# Patient Record
Sex: Female | Born: 1935 | Race: White | Hispanic: No | Smoking: Never smoker
Health system: Southern US, Community
[De-identification: ages and names within clinical notes are randomized; demographics above are authoritative.]

## PROBLEM LIST (undated history)

## (undated) DIAGNOSIS — H409 Unspecified glaucoma: Secondary | ICD-10-CM

## (undated) DIAGNOSIS — E039 Hypothyroidism, unspecified: Secondary | ICD-10-CM

## (undated) DIAGNOSIS — E119 Type 2 diabetes mellitus without complications: Secondary | ICD-10-CM

## (undated) DIAGNOSIS — I10 Essential (primary) hypertension: Secondary | ICD-10-CM

## (undated) HISTORY — PX: APPENDECTOMY: SHX54

## (undated) HISTORY — PX: ABDOMINAL HYSTERECTOMY: SUR658

## (undated) HISTORY — PX: CATARACT EXTRACTION, BILATERAL: SHX1313

## (undated) HISTORY — PX: TONSILLECTOMY: SUR1361

---

## 2011-08-10 ENCOUNTER — Encounter (INDEPENDENT_AMBULATORY_CARE_PROVIDER_SITE_OTHER): Payer: Medicare Other | Admitting: Ophthalmology

## 2011-08-10 DIAGNOSIS — H3530 Unspecified macular degeneration: Secondary | ICD-10-CM

## 2011-08-10 DIAGNOSIS — H43399 Other vitreous opacities, unspecified eye: Secondary | ICD-10-CM

## 2011-08-10 DIAGNOSIS — H35039 Hypertensive retinopathy, unspecified eye: Secondary | ICD-10-CM

## 2011-08-10 DIAGNOSIS — E1165 Type 2 diabetes mellitus with hyperglycemia: Secondary | ICD-10-CM

## 2011-08-10 DIAGNOSIS — I10 Essential (primary) hypertension: Secondary | ICD-10-CM

## 2011-08-10 DIAGNOSIS — H409 Unspecified glaucoma: Secondary | ICD-10-CM

## 2011-08-10 DIAGNOSIS — H348192 Central retinal vein occlusion, unspecified eye, stable: Secondary | ICD-10-CM

## 2011-08-10 DIAGNOSIS — E11319 Type 2 diabetes mellitus with unspecified diabetic retinopathy without macular edema: Secondary | ICD-10-CM

## 2011-10-05 ENCOUNTER — Encounter (INDEPENDENT_AMBULATORY_CARE_PROVIDER_SITE_OTHER): Payer: Medicare Other | Admitting: Ophthalmology

## 2011-10-05 DIAGNOSIS — E11319 Type 2 diabetes mellitus with unspecified diabetic retinopathy without macular edema: Secondary | ICD-10-CM

## 2011-10-05 DIAGNOSIS — H35039 Hypertensive retinopathy, unspecified eye: Secondary | ICD-10-CM

## 2011-10-05 DIAGNOSIS — E1165 Type 2 diabetes mellitus with hyperglycemia: Secondary | ICD-10-CM

## 2011-10-05 DIAGNOSIS — H348192 Central retinal vein occlusion, unspecified eye, stable: Secondary | ICD-10-CM

## 2011-10-05 DIAGNOSIS — E1139 Type 2 diabetes mellitus with other diabetic ophthalmic complication: Secondary | ICD-10-CM

## 2011-10-05 DIAGNOSIS — H3530 Unspecified macular degeneration: Secondary | ICD-10-CM

## 2011-10-05 DIAGNOSIS — I10 Essential (primary) hypertension: Secondary | ICD-10-CM

## 2011-10-05 DIAGNOSIS — H43819 Vitreous degeneration, unspecified eye: Secondary | ICD-10-CM

## 2011-11-02 ENCOUNTER — Encounter (INDEPENDENT_AMBULATORY_CARE_PROVIDER_SITE_OTHER): Payer: Medicare Other | Admitting: Ophthalmology

## 2011-11-02 DIAGNOSIS — I10 Essential (primary) hypertension: Secondary | ICD-10-CM

## 2011-11-02 DIAGNOSIS — H35039 Hypertensive retinopathy, unspecified eye: Secondary | ICD-10-CM

## 2011-11-02 DIAGNOSIS — H43819 Vitreous degeneration, unspecified eye: Secondary | ICD-10-CM

## 2011-11-02 DIAGNOSIS — E1165 Type 2 diabetes mellitus with hyperglycemia: Secondary | ICD-10-CM

## 2011-11-02 DIAGNOSIS — H348192 Central retinal vein occlusion, unspecified eye, stable: Secondary | ICD-10-CM

## 2011-11-02 DIAGNOSIS — H3530 Unspecified macular degeneration: Secondary | ICD-10-CM

## 2011-11-02 DIAGNOSIS — E11319 Type 2 diabetes mellitus with unspecified diabetic retinopathy without macular edema: Secondary | ICD-10-CM

## 2011-12-12 ENCOUNTER — Encounter (INDEPENDENT_AMBULATORY_CARE_PROVIDER_SITE_OTHER): Payer: Medicare Other | Admitting: Ophthalmology

## 2011-12-20 ENCOUNTER — Encounter (INDEPENDENT_AMBULATORY_CARE_PROVIDER_SITE_OTHER): Payer: Medicare Other | Admitting: Ophthalmology

## 2011-12-20 DIAGNOSIS — H43819 Vitreous degeneration, unspecified eye: Secondary | ICD-10-CM

## 2011-12-20 DIAGNOSIS — I10 Essential (primary) hypertension: Secondary | ICD-10-CM

## 2011-12-20 DIAGNOSIS — H3530 Unspecified macular degeneration: Secondary | ICD-10-CM

## 2011-12-20 DIAGNOSIS — H348192 Central retinal vein occlusion, unspecified eye, stable: Secondary | ICD-10-CM

## 2011-12-20 DIAGNOSIS — H35039 Hypertensive retinopathy, unspecified eye: Secondary | ICD-10-CM

## 2012-01-11 ENCOUNTER — Encounter (INDEPENDENT_AMBULATORY_CARE_PROVIDER_SITE_OTHER): Payer: Medicare Other | Admitting: Ophthalmology

## 2012-01-11 DIAGNOSIS — E1139 Type 2 diabetes mellitus with other diabetic ophthalmic complication: Secondary | ICD-10-CM

## 2012-01-11 DIAGNOSIS — I10 Essential (primary) hypertension: Secondary | ICD-10-CM

## 2012-01-11 DIAGNOSIS — H3530 Unspecified macular degeneration: Secondary | ICD-10-CM

## 2012-01-11 DIAGNOSIS — E1165 Type 2 diabetes mellitus with hyperglycemia: Secondary | ICD-10-CM

## 2012-01-11 DIAGNOSIS — H35039 Hypertensive retinopathy, unspecified eye: Secondary | ICD-10-CM

## 2012-01-11 DIAGNOSIS — E11319 Type 2 diabetes mellitus with unspecified diabetic retinopathy without macular edema: Secondary | ICD-10-CM

## 2012-01-11 DIAGNOSIS — H348192 Central retinal vein occlusion, unspecified eye, stable: Secondary | ICD-10-CM

## 2012-01-11 DIAGNOSIS — H43819 Vitreous degeneration, unspecified eye: Secondary | ICD-10-CM

## 2012-02-13 ENCOUNTER — Encounter (INDEPENDENT_AMBULATORY_CARE_PROVIDER_SITE_OTHER): Payer: Medicare Other | Admitting: Ophthalmology

## 2012-02-13 DIAGNOSIS — H43819 Vitreous degeneration, unspecified eye: Secondary | ICD-10-CM

## 2012-02-13 DIAGNOSIS — E11319 Type 2 diabetes mellitus with unspecified diabetic retinopathy without macular edema: Secondary | ICD-10-CM

## 2012-02-13 DIAGNOSIS — H348192 Central retinal vein occlusion, unspecified eye, stable: Secondary | ICD-10-CM

## 2012-02-13 DIAGNOSIS — I10 Essential (primary) hypertension: Secondary | ICD-10-CM

## 2012-02-13 DIAGNOSIS — E1165 Type 2 diabetes mellitus with hyperglycemia: Secondary | ICD-10-CM

## 2012-02-13 DIAGNOSIS — H3530 Unspecified macular degeneration: Secondary | ICD-10-CM

## 2012-02-13 DIAGNOSIS — H35039 Hypertensive retinopathy, unspecified eye: Secondary | ICD-10-CM

## 2012-03-19 ENCOUNTER — Encounter (INDEPENDENT_AMBULATORY_CARE_PROVIDER_SITE_OTHER): Payer: Medicare Other | Admitting: Ophthalmology

## 2012-03-19 DIAGNOSIS — I10 Essential (primary) hypertension: Secondary | ICD-10-CM

## 2012-03-19 DIAGNOSIS — H43819 Vitreous degeneration, unspecified eye: Secondary | ICD-10-CM

## 2012-03-19 DIAGNOSIS — H348192 Central retinal vein occlusion, unspecified eye, stable: Secondary | ICD-10-CM

## 2012-03-19 DIAGNOSIS — H3530 Unspecified macular degeneration: Secondary | ICD-10-CM

## 2012-03-19 DIAGNOSIS — E1165 Type 2 diabetes mellitus with hyperglycemia: Secondary | ICD-10-CM

## 2012-03-19 DIAGNOSIS — E11319 Type 2 diabetes mellitus with unspecified diabetic retinopathy without macular edema: Secondary | ICD-10-CM

## 2012-03-19 DIAGNOSIS — H35039 Hypertensive retinopathy, unspecified eye: Secondary | ICD-10-CM

## 2012-07-17 ENCOUNTER — Ambulatory Visit (INDEPENDENT_AMBULATORY_CARE_PROVIDER_SITE_OTHER): Payer: Medicare Other | Admitting: Ophthalmology

## 2012-07-17 DIAGNOSIS — E11319 Type 2 diabetes mellitus with unspecified diabetic retinopathy without macular edema: Secondary | ICD-10-CM

## 2012-07-17 DIAGNOSIS — H43819 Vitreous degeneration, unspecified eye: Secondary | ICD-10-CM

## 2012-07-17 DIAGNOSIS — I10 Essential (primary) hypertension: Secondary | ICD-10-CM

## 2012-07-17 DIAGNOSIS — E1165 Type 2 diabetes mellitus with hyperglycemia: Secondary | ICD-10-CM

## 2012-07-17 DIAGNOSIS — H348392 Tributary (branch) retinal vein occlusion, unspecified eye, stable: Secondary | ICD-10-CM

## 2012-07-17 DIAGNOSIS — H3530 Unspecified macular degeneration: Secondary | ICD-10-CM

## 2012-07-17 DIAGNOSIS — H35039 Hypertensive retinopathy, unspecified eye: Secondary | ICD-10-CM

## 2012-08-12 ENCOUNTER — Encounter (INDEPENDENT_AMBULATORY_CARE_PROVIDER_SITE_OTHER): Payer: Medicare Other | Admitting: Ophthalmology

## 2012-08-12 DIAGNOSIS — H43819 Vitreous degeneration, unspecified eye: Secondary | ICD-10-CM

## 2012-08-12 DIAGNOSIS — I10 Essential (primary) hypertension: Secondary | ICD-10-CM

## 2012-08-12 DIAGNOSIS — E11319 Type 2 diabetes mellitus with unspecified diabetic retinopathy without macular edema: Secondary | ICD-10-CM

## 2012-08-12 DIAGNOSIS — H35039 Hypertensive retinopathy, unspecified eye: Secondary | ICD-10-CM

## 2012-08-12 DIAGNOSIS — H348392 Tributary (branch) retinal vein occlusion, unspecified eye, stable: Secondary | ICD-10-CM

## 2012-08-12 DIAGNOSIS — H3530 Unspecified macular degeneration: Secondary | ICD-10-CM

## 2012-09-23 ENCOUNTER — Encounter (INDEPENDENT_AMBULATORY_CARE_PROVIDER_SITE_OTHER): Payer: Medicare Other | Admitting: Ophthalmology

## 2012-09-23 DIAGNOSIS — I10 Essential (primary) hypertension: Secondary | ICD-10-CM

## 2012-09-23 DIAGNOSIS — E11319 Type 2 diabetes mellitus with unspecified diabetic retinopathy without macular edema: Secondary | ICD-10-CM

## 2012-09-23 DIAGNOSIS — H348392 Tributary (branch) retinal vein occlusion, unspecified eye, stable: Secondary | ICD-10-CM

## 2012-09-23 DIAGNOSIS — H35039 Hypertensive retinopathy, unspecified eye: Secondary | ICD-10-CM

## 2012-09-23 DIAGNOSIS — E1139 Type 2 diabetes mellitus with other diabetic ophthalmic complication: Secondary | ICD-10-CM

## 2012-11-19 ENCOUNTER — Encounter (INDEPENDENT_AMBULATORY_CARE_PROVIDER_SITE_OTHER): Payer: Medicare Other | Admitting: Ophthalmology

## 2012-11-19 DIAGNOSIS — H35039 Hypertensive retinopathy, unspecified eye: Secondary | ICD-10-CM

## 2012-11-19 DIAGNOSIS — I10 Essential (primary) hypertension: Secondary | ICD-10-CM

## 2012-11-19 DIAGNOSIS — H43819 Vitreous degeneration, unspecified eye: Secondary | ICD-10-CM

## 2012-11-19 DIAGNOSIS — E11319 Type 2 diabetes mellitus with unspecified diabetic retinopathy without macular edema: Secondary | ICD-10-CM

## 2012-11-19 DIAGNOSIS — H3530 Unspecified macular degeneration: Secondary | ICD-10-CM

## 2012-11-19 DIAGNOSIS — E1139 Type 2 diabetes mellitus with other diabetic ophthalmic complication: Secondary | ICD-10-CM

## 2012-11-19 DIAGNOSIS — H348392 Tributary (branch) retinal vein occlusion, unspecified eye, stable: Secondary | ICD-10-CM

## 2013-01-07 ENCOUNTER — Encounter (INDEPENDENT_AMBULATORY_CARE_PROVIDER_SITE_OTHER): Payer: Medicare Other | Admitting: Ophthalmology

## 2013-01-07 DIAGNOSIS — E11319 Type 2 diabetes mellitus with unspecified diabetic retinopathy without macular edema: Secondary | ICD-10-CM

## 2013-01-07 DIAGNOSIS — I10 Essential (primary) hypertension: Secondary | ICD-10-CM

## 2013-01-07 DIAGNOSIS — H35039 Hypertensive retinopathy, unspecified eye: Secondary | ICD-10-CM

## 2013-01-07 DIAGNOSIS — H43819 Vitreous degeneration, unspecified eye: Secondary | ICD-10-CM

## 2013-01-07 DIAGNOSIS — H348392 Tributary (branch) retinal vein occlusion, unspecified eye, stable: Secondary | ICD-10-CM

## 2013-01-07 DIAGNOSIS — E1139 Type 2 diabetes mellitus with other diabetic ophthalmic complication: Secondary | ICD-10-CM

## 2013-02-25 ENCOUNTER — Encounter (INDEPENDENT_AMBULATORY_CARE_PROVIDER_SITE_OTHER): Payer: Medicare Other | Admitting: Ophthalmology

## 2013-02-25 DIAGNOSIS — H348392 Tributary (branch) retinal vein occlusion, unspecified eye, stable: Secondary | ICD-10-CM

## 2013-02-25 DIAGNOSIS — H3530 Unspecified macular degeneration: Secondary | ICD-10-CM

## 2013-02-25 DIAGNOSIS — E1165 Type 2 diabetes mellitus with hyperglycemia: Secondary | ICD-10-CM

## 2013-02-25 DIAGNOSIS — E11319 Type 2 diabetes mellitus with unspecified diabetic retinopathy without macular edema: Secondary | ICD-10-CM

## 2013-02-25 DIAGNOSIS — I10 Essential (primary) hypertension: Secondary | ICD-10-CM

## 2013-02-25 DIAGNOSIS — H35039 Hypertensive retinopathy, unspecified eye: Secondary | ICD-10-CM

## 2013-02-25 DIAGNOSIS — E1139 Type 2 diabetes mellitus with other diabetic ophthalmic complication: Secondary | ICD-10-CM

## 2013-02-25 DIAGNOSIS — H43819 Vitreous degeneration, unspecified eye: Secondary | ICD-10-CM

## 2013-05-20 ENCOUNTER — Encounter (INDEPENDENT_AMBULATORY_CARE_PROVIDER_SITE_OTHER): Payer: Medicare Other | Admitting: Ophthalmology

## 2013-05-20 DIAGNOSIS — I10 Essential (primary) hypertension: Secondary | ICD-10-CM

## 2013-05-20 DIAGNOSIS — H3530 Unspecified macular degeneration: Secondary | ICD-10-CM

## 2013-05-20 DIAGNOSIS — H348392 Tributary (branch) retinal vein occlusion, unspecified eye, stable: Secondary | ICD-10-CM

## 2013-05-20 DIAGNOSIS — E1139 Type 2 diabetes mellitus with other diabetic ophthalmic complication: Secondary | ICD-10-CM

## 2013-05-20 DIAGNOSIS — H35039 Hypertensive retinopathy, unspecified eye: Secondary | ICD-10-CM

## 2013-05-20 DIAGNOSIS — E1165 Type 2 diabetes mellitus with hyperglycemia: Secondary | ICD-10-CM

## 2013-05-20 DIAGNOSIS — E11319 Type 2 diabetes mellitus with unspecified diabetic retinopathy without macular edema: Secondary | ICD-10-CM

## 2013-07-01 ENCOUNTER — Encounter (INDEPENDENT_AMBULATORY_CARE_PROVIDER_SITE_OTHER): Payer: Medicare Other | Admitting: Ophthalmology

## 2013-07-01 DIAGNOSIS — E11319 Type 2 diabetes mellitus with unspecified diabetic retinopathy without macular edema: Secondary | ICD-10-CM

## 2013-07-01 DIAGNOSIS — I10 Essential (primary) hypertension: Secondary | ICD-10-CM

## 2013-07-01 DIAGNOSIS — H35039 Hypertensive retinopathy, unspecified eye: Secondary | ICD-10-CM

## 2013-07-01 DIAGNOSIS — E1139 Type 2 diabetes mellitus with other diabetic ophthalmic complication: Secondary | ICD-10-CM

## 2013-07-01 DIAGNOSIS — E1165 Type 2 diabetes mellitus with hyperglycemia: Secondary | ICD-10-CM

## 2013-07-01 DIAGNOSIS — H43819 Vitreous degeneration, unspecified eye: Secondary | ICD-10-CM

## 2013-07-01 DIAGNOSIS — H3530 Unspecified macular degeneration: Secondary | ICD-10-CM

## 2013-07-01 DIAGNOSIS — H348392 Tributary (branch) retinal vein occlusion, unspecified eye, stable: Secondary | ICD-10-CM

## 2013-09-02 ENCOUNTER — Encounter (INDEPENDENT_AMBULATORY_CARE_PROVIDER_SITE_OTHER): Payer: Medicare Other | Admitting: Ophthalmology

## 2013-09-02 DIAGNOSIS — I10 Essential (primary) hypertension: Secondary | ICD-10-CM | POA: Diagnosis not present

## 2013-09-02 DIAGNOSIS — H3530 Unspecified macular degeneration: Secondary | ICD-10-CM

## 2013-09-02 DIAGNOSIS — H43819 Vitreous degeneration, unspecified eye: Secondary | ICD-10-CM

## 2013-09-02 DIAGNOSIS — H348392 Tributary (branch) retinal vein occlusion, unspecified eye, stable: Secondary | ICD-10-CM

## 2013-09-02 DIAGNOSIS — E11319 Type 2 diabetes mellitus with unspecified diabetic retinopathy without macular edema: Secondary | ICD-10-CM

## 2013-09-02 DIAGNOSIS — H35039 Hypertensive retinopathy, unspecified eye: Secondary | ICD-10-CM | POA: Diagnosis not present

## 2013-09-02 DIAGNOSIS — E1165 Type 2 diabetes mellitus with hyperglycemia: Secondary | ICD-10-CM

## 2013-09-02 DIAGNOSIS — E1139 Type 2 diabetes mellitus with other diabetic ophthalmic complication: Secondary | ICD-10-CM

## 2013-10-21 ENCOUNTER — Encounter (INDEPENDENT_AMBULATORY_CARE_PROVIDER_SITE_OTHER): Payer: Medicare Other | Admitting: Ophthalmology

## 2013-10-21 DIAGNOSIS — E1165 Type 2 diabetes mellitus with hyperglycemia: Secondary | ICD-10-CM

## 2013-10-21 DIAGNOSIS — E1139 Type 2 diabetes mellitus with other diabetic ophthalmic complication: Secondary | ICD-10-CM | POA: Diagnosis not present

## 2013-10-21 DIAGNOSIS — H35039 Hypertensive retinopathy, unspecified eye: Secondary | ICD-10-CM

## 2013-10-21 DIAGNOSIS — I10 Essential (primary) hypertension: Secondary | ICD-10-CM

## 2013-10-21 DIAGNOSIS — H43819 Vitreous degeneration, unspecified eye: Secondary | ICD-10-CM

## 2013-10-21 DIAGNOSIS — H3530 Unspecified macular degeneration: Secondary | ICD-10-CM

## 2013-10-21 DIAGNOSIS — H348392 Tributary (branch) retinal vein occlusion, unspecified eye, stable: Secondary | ICD-10-CM

## 2013-10-21 DIAGNOSIS — E11319 Type 2 diabetes mellitus with unspecified diabetic retinopathy without macular edema: Secondary | ICD-10-CM

## 2013-12-09 ENCOUNTER — Encounter (INDEPENDENT_AMBULATORY_CARE_PROVIDER_SITE_OTHER): Payer: Medicare Other | Admitting: Ophthalmology

## 2013-12-17 ENCOUNTER — Encounter (INDEPENDENT_AMBULATORY_CARE_PROVIDER_SITE_OTHER): Payer: Medicare Other | Admitting: Ophthalmology

## 2013-12-21 ENCOUNTER — Encounter (INDEPENDENT_AMBULATORY_CARE_PROVIDER_SITE_OTHER): Payer: Medicare Other | Admitting: Ophthalmology

## 2013-12-21 DIAGNOSIS — H43813 Vitreous degeneration, bilateral: Secondary | ICD-10-CM

## 2013-12-21 DIAGNOSIS — E11311 Type 2 diabetes mellitus with unspecified diabetic retinopathy with macular edema: Secondary | ICD-10-CM

## 2013-12-21 DIAGNOSIS — H35033 Hypertensive retinopathy, bilateral: Secondary | ICD-10-CM

## 2013-12-21 DIAGNOSIS — E11331 Type 2 diabetes mellitus with moderate nonproliferative diabetic retinopathy with macular edema: Secondary | ICD-10-CM

## 2013-12-21 DIAGNOSIS — I10 Essential (primary) hypertension: Secondary | ICD-10-CM

## 2013-12-21 DIAGNOSIS — H34831 Tributary (branch) retinal vein occlusion, right eye: Secondary | ICD-10-CM

## 2013-12-21 DIAGNOSIS — E11339 Type 2 diabetes mellitus with moderate nonproliferative diabetic retinopathy without macular edema: Secondary | ICD-10-CM

## 2013-12-21 DIAGNOSIS — H3531 Nonexudative age-related macular degeneration: Secondary | ICD-10-CM

## 2014-02-08 ENCOUNTER — Encounter (INDEPENDENT_AMBULATORY_CARE_PROVIDER_SITE_OTHER): Payer: Medicare Other | Admitting: Ophthalmology

## 2014-02-11 ENCOUNTER — Encounter (INDEPENDENT_AMBULATORY_CARE_PROVIDER_SITE_OTHER): Payer: Medicare Other | Admitting: Ophthalmology

## 2014-02-11 DIAGNOSIS — H43813 Vitreous degeneration, bilateral: Secondary | ICD-10-CM

## 2014-02-11 DIAGNOSIS — H34831 Tributary (branch) retinal vein occlusion, right eye: Secondary | ICD-10-CM

## 2014-02-11 DIAGNOSIS — H35033 Hypertensive retinopathy, bilateral: Secondary | ICD-10-CM

## 2014-02-11 DIAGNOSIS — E11329 Type 2 diabetes mellitus with mild nonproliferative diabetic retinopathy without macular edema: Secondary | ICD-10-CM

## 2014-02-11 DIAGNOSIS — E11319 Type 2 diabetes mellitus with unspecified diabetic retinopathy without macular edema: Secondary | ICD-10-CM

## 2014-02-11 DIAGNOSIS — H3531 Nonexudative age-related macular degeneration: Secondary | ICD-10-CM

## 2014-02-11 DIAGNOSIS — I10 Essential (primary) hypertension: Secondary | ICD-10-CM | POA: Diagnosis not present

## 2014-04-01 ENCOUNTER — Encounter (INDEPENDENT_AMBULATORY_CARE_PROVIDER_SITE_OTHER): Payer: Medicare Other | Admitting: Ophthalmology

## 2014-04-15 ENCOUNTER — Encounter (INDEPENDENT_AMBULATORY_CARE_PROVIDER_SITE_OTHER): Payer: Medicare Other | Admitting: Ophthalmology

## 2014-04-15 DIAGNOSIS — E11339 Type 2 diabetes mellitus with moderate nonproliferative diabetic retinopathy without macular edema: Secondary | ICD-10-CM | POA: Diagnosis not present

## 2014-04-15 DIAGNOSIS — H3531 Nonexudative age-related macular degeneration: Secondary | ICD-10-CM | POA: Diagnosis not present

## 2014-04-15 DIAGNOSIS — H43813 Vitreous degeneration, bilateral: Secondary | ICD-10-CM | POA: Diagnosis not present

## 2014-04-15 DIAGNOSIS — E11319 Type 2 diabetes mellitus with unspecified diabetic retinopathy without macular edema: Secondary | ICD-10-CM | POA: Diagnosis not present

## 2014-04-15 DIAGNOSIS — H34831 Tributary (branch) retinal vein occlusion, right eye: Secondary | ICD-10-CM | POA: Diagnosis not present

## 2014-04-15 DIAGNOSIS — I10 Essential (primary) hypertension: Secondary | ICD-10-CM

## 2014-04-15 DIAGNOSIS — H35033 Hypertensive retinopathy, bilateral: Secondary | ICD-10-CM | POA: Diagnosis not present

## 2014-05-27 ENCOUNTER — Encounter (INDEPENDENT_AMBULATORY_CARE_PROVIDER_SITE_OTHER): Payer: Medicare Other | Admitting: Ophthalmology

## 2014-05-27 DIAGNOSIS — H34831 Tributary (branch) retinal vein occlusion, right eye: Secondary | ICD-10-CM

## 2014-05-27 DIAGNOSIS — H35033 Hypertensive retinopathy, bilateral: Secondary | ICD-10-CM

## 2014-05-27 DIAGNOSIS — I10 Essential (primary) hypertension: Secondary | ICD-10-CM | POA: Diagnosis not present

## 2014-05-27 DIAGNOSIS — H3531 Nonexudative age-related macular degeneration: Secondary | ICD-10-CM | POA: Diagnosis not present

## 2014-05-27 DIAGNOSIS — E11319 Type 2 diabetes mellitus with unspecified diabetic retinopathy without macular edema: Secondary | ICD-10-CM

## 2014-05-27 DIAGNOSIS — E11339 Type 2 diabetes mellitus with moderate nonproliferative diabetic retinopathy without macular edema: Secondary | ICD-10-CM | POA: Diagnosis not present

## 2014-05-27 DIAGNOSIS — E11329 Type 2 diabetes mellitus with mild nonproliferative diabetic retinopathy without macular edema: Secondary | ICD-10-CM

## 2014-05-27 DIAGNOSIS — H43813 Vitreous degeneration, bilateral: Secondary | ICD-10-CM

## 2014-07-08 ENCOUNTER — Encounter (INDEPENDENT_AMBULATORY_CARE_PROVIDER_SITE_OTHER): Payer: Medicare Other | Admitting: Ophthalmology

## 2014-07-08 DIAGNOSIS — H35033 Hypertensive retinopathy, bilateral: Secondary | ICD-10-CM | POA: Diagnosis not present

## 2014-07-08 DIAGNOSIS — E11329 Type 2 diabetes mellitus with mild nonproliferative diabetic retinopathy without macular edema: Secondary | ICD-10-CM

## 2014-07-08 DIAGNOSIS — E11319 Type 2 diabetes mellitus with unspecified diabetic retinopathy without macular edema: Secondary | ICD-10-CM

## 2014-07-08 DIAGNOSIS — I10 Essential (primary) hypertension: Secondary | ICD-10-CM

## 2014-07-08 DIAGNOSIS — H43813 Vitreous degeneration, bilateral: Secondary | ICD-10-CM | POA: Diagnosis not present

## 2014-07-08 DIAGNOSIS — H34831 Tributary (branch) retinal vein occlusion, right eye: Secondary | ICD-10-CM

## 2014-08-19 ENCOUNTER — Encounter (INDEPENDENT_AMBULATORY_CARE_PROVIDER_SITE_OTHER): Payer: Medicare Other | Admitting: Ophthalmology

## 2014-08-19 DIAGNOSIS — H43813 Vitreous degeneration, bilateral: Secondary | ICD-10-CM

## 2014-08-19 DIAGNOSIS — H3531 Nonexudative age-related macular degeneration: Secondary | ICD-10-CM

## 2014-08-19 DIAGNOSIS — H34831 Tributary (branch) retinal vein occlusion, right eye: Secondary | ICD-10-CM

## 2014-08-19 DIAGNOSIS — E11339 Type 2 diabetes mellitus with moderate nonproliferative diabetic retinopathy without macular edema: Secondary | ICD-10-CM | POA: Diagnosis not present

## 2014-08-19 DIAGNOSIS — H35033 Hypertensive retinopathy, bilateral: Secondary | ICD-10-CM

## 2014-08-19 DIAGNOSIS — E11319 Type 2 diabetes mellitus with unspecified diabetic retinopathy without macular edema: Secondary | ICD-10-CM | POA: Diagnosis not present

## 2014-08-19 DIAGNOSIS — I10 Essential (primary) hypertension: Secondary | ICD-10-CM

## 2014-09-30 ENCOUNTER — Encounter (INDEPENDENT_AMBULATORY_CARE_PROVIDER_SITE_OTHER): Payer: Medicare Other | Admitting: Ophthalmology

## 2014-09-30 DIAGNOSIS — E11339 Type 2 diabetes mellitus with moderate nonproliferative diabetic retinopathy without macular edema: Secondary | ICD-10-CM | POA: Diagnosis not present

## 2014-09-30 DIAGNOSIS — H3531 Nonexudative age-related macular degeneration: Secondary | ICD-10-CM

## 2014-09-30 DIAGNOSIS — E11329 Type 2 diabetes mellitus with mild nonproliferative diabetic retinopathy without macular edema: Secondary | ICD-10-CM | POA: Diagnosis not present

## 2014-09-30 DIAGNOSIS — E11319 Type 2 diabetes mellitus with unspecified diabetic retinopathy without macular edema: Secondary | ICD-10-CM

## 2014-09-30 DIAGNOSIS — I10 Essential (primary) hypertension: Secondary | ICD-10-CM | POA: Diagnosis not present

## 2014-09-30 DIAGNOSIS — H35033 Hypertensive retinopathy, bilateral: Secondary | ICD-10-CM

## 2014-09-30 DIAGNOSIS — H34831 Tributary (branch) retinal vein occlusion, right eye: Secondary | ICD-10-CM | POA: Diagnosis not present

## 2014-09-30 DIAGNOSIS — H43813 Vitreous degeneration, bilateral: Secondary | ICD-10-CM | POA: Diagnosis not present

## 2014-11-04 ENCOUNTER — Encounter (INDEPENDENT_AMBULATORY_CARE_PROVIDER_SITE_OTHER): Payer: Medicare Other | Admitting: Ophthalmology

## 2014-11-04 DIAGNOSIS — H35033 Hypertensive retinopathy, bilateral: Secondary | ICD-10-CM

## 2014-11-04 DIAGNOSIS — H34831 Tributary (branch) retinal vein occlusion, right eye, with macular edema: Secondary | ICD-10-CM | POA: Diagnosis not present

## 2014-11-04 DIAGNOSIS — H353132 Nonexudative age-related macular degeneration, bilateral, intermediate dry stage: Secondary | ICD-10-CM | POA: Diagnosis not present

## 2014-11-04 DIAGNOSIS — I10 Essential (primary) hypertension: Secondary | ICD-10-CM

## 2014-12-16 ENCOUNTER — Encounter (INDEPENDENT_AMBULATORY_CARE_PROVIDER_SITE_OTHER): Payer: Medicare Other | Admitting: Ophthalmology

## 2014-12-16 DIAGNOSIS — H353132 Nonexudative age-related macular degeneration, bilateral, intermediate dry stage: Secondary | ICD-10-CM

## 2014-12-16 DIAGNOSIS — H43813 Vitreous degeneration, bilateral: Secondary | ICD-10-CM

## 2014-12-16 DIAGNOSIS — H35033 Hypertensive retinopathy, bilateral: Secondary | ICD-10-CM | POA: Diagnosis not present

## 2014-12-16 DIAGNOSIS — E113292 Type 2 diabetes mellitus with mild nonproliferative diabetic retinopathy without macular edema, left eye: Secondary | ICD-10-CM | POA: Diagnosis not present

## 2014-12-16 DIAGNOSIS — E113311 Type 2 diabetes mellitus with moderate nonproliferative diabetic retinopathy with macular edema, right eye: Secondary | ICD-10-CM

## 2014-12-16 DIAGNOSIS — H34831 Tributary (branch) retinal vein occlusion, right eye, with macular edema: Secondary | ICD-10-CM

## 2014-12-16 DIAGNOSIS — I10 Essential (primary) hypertension: Secondary | ICD-10-CM

## 2014-12-16 DIAGNOSIS — E11311 Type 2 diabetes mellitus with unspecified diabetic retinopathy with macular edema: Secondary | ICD-10-CM | POA: Diagnosis not present

## 2015-01-14 ENCOUNTER — Encounter (HOSPITAL_COMMUNITY): Payer: Self-pay | Admitting: Internal Medicine

## 2015-01-14 ENCOUNTER — Inpatient Hospital Stay (HOSPITAL_COMMUNITY)
Admission: AD | Admit: 2015-01-14 | Discharge: 2015-01-18 | DRG: 065 | Disposition: A | Discharge status: Rehabilitation Facility | Payer: Medicare Other | Source: Other Acute Inpatient Hospital | Attending: Internal Medicine | Admitting: Internal Medicine

## 2015-01-14 ENCOUNTER — Telehealth: Payer: Self-pay | Admitting: Internal Medicine

## 2015-01-14 DIAGNOSIS — I4892 Unspecified atrial flutter: Secondary | ICD-10-CM | POA: Diagnosis present

## 2015-01-14 DIAGNOSIS — H409 Unspecified glaucoma: Secondary | ICD-10-CM | POA: Diagnosis present

## 2015-01-14 DIAGNOSIS — N390 Urinary tract infection, site not specified: Secondary | ICD-10-CM | POA: Diagnosis present

## 2015-01-14 DIAGNOSIS — Z823 Family history of stroke: Secondary | ICD-10-CM

## 2015-01-14 DIAGNOSIS — E1169 Type 2 diabetes mellitus with other specified complication: Secondary | ICD-10-CM | POA: Diagnosis not present

## 2015-01-14 DIAGNOSIS — I6350 Cerebral infarction due to unspecified occlusion or stenosis of unspecified cerebral artery: Secondary | ICD-10-CM | POA: Diagnosis not present

## 2015-01-14 DIAGNOSIS — I69359 Hemiplegia and hemiparesis following cerebral infarction affecting unspecified side: Secondary | ICD-10-CM | POA: Insufficient documentation

## 2015-01-14 DIAGNOSIS — E119 Type 2 diabetes mellitus without complications: Secondary | ICD-10-CM

## 2015-01-14 DIAGNOSIS — E876 Hypokalemia: Secondary | ICD-10-CM | POA: Diagnosis present

## 2015-01-14 DIAGNOSIS — E785 Hyperlipidemia, unspecified: Secondary | ICD-10-CM | POA: Diagnosis present

## 2015-01-14 DIAGNOSIS — Z6834 Body mass index (BMI) 34.0-34.9, adult: Secondary | ICD-10-CM

## 2015-01-14 DIAGNOSIS — Z8249 Family history of ischemic heart disease and other diseases of the circulatory system: Secondary | ICD-10-CM

## 2015-01-14 DIAGNOSIS — E1142 Type 2 diabetes mellitus with diabetic polyneuropathy: Secondary | ICD-10-CM | POA: Diagnosis present

## 2015-01-14 DIAGNOSIS — I10 Essential (primary) hypertension: Secondary | ICD-10-CM | POA: Diagnosis not present

## 2015-01-14 DIAGNOSIS — R471 Dysarthria and anarthria: Secondary | ICD-10-CM | POA: Diagnosis present

## 2015-01-14 DIAGNOSIS — M159 Polyosteoarthritis, unspecified: Secondary | ICD-10-CM | POA: Insufficient documentation

## 2015-01-14 DIAGNOSIS — E039 Hypothyroidism, unspecified: Secondary | ICD-10-CM | POA: Diagnosis present

## 2015-01-14 DIAGNOSIS — E1151 Type 2 diabetes mellitus with diabetic peripheral angiopathy without gangrene: Secondary | ICD-10-CM | POA: Diagnosis present

## 2015-01-14 DIAGNOSIS — R29810 Facial weakness: Secondary | ICD-10-CM | POA: Diagnosis present

## 2015-01-14 DIAGNOSIS — R269 Unspecified abnormalities of gait and mobility: Secondary | ICD-10-CM | POA: Diagnosis present

## 2015-01-14 DIAGNOSIS — E669 Obesity, unspecified: Secondary | ICD-10-CM | POA: Diagnosis present

## 2015-01-14 DIAGNOSIS — G8194 Hemiplegia, unspecified affecting left nondominant side: Secondary | ICD-10-CM | POA: Diagnosis present

## 2015-01-14 DIAGNOSIS — R52 Pain, unspecified: Secondary | ICD-10-CM

## 2015-01-14 DIAGNOSIS — I639 Cerebral infarction, unspecified: Secondary | ICD-10-CM | POA: Diagnosis not present

## 2015-01-14 DIAGNOSIS — I6319 Cerebral infarction due to embolism of other precerebral artery: Secondary | ICD-10-CM | POA: Diagnosis present

## 2015-01-14 DIAGNOSIS — I6789 Other cerebrovascular disease: Secondary | ICD-10-CM | POA: Diagnosis not present

## 2015-01-14 DIAGNOSIS — I6359 Cerebral infarction due to unspecified occlusion or stenosis of other cerebral artery: Secondary | ICD-10-CM | POA: Diagnosis not present

## 2015-01-14 DIAGNOSIS — M10042 Idiopathic gout, left hand: Secondary | ICD-10-CM | POA: Diagnosis not present

## 2015-01-14 DIAGNOSIS — R4781 Slurred speech: Secondary | ICD-10-CM | POA: Diagnosis present

## 2015-01-14 HISTORY — DX: Essential (primary) hypertension: I10

## 2015-01-14 HISTORY — DX: Type 2 diabetes mellitus without complications: E11.9

## 2015-01-14 HISTORY — DX: Hypothyroidism, unspecified: E03.9

## 2015-01-14 HISTORY — DX: Unspecified glaucoma: H40.9

## 2015-01-14 MED ORDER — INSULIN ASPART 100 UNIT/ML ~~LOC~~ SOLN: 0.0000 [IU] | Freq: Every day | SUBCUTANEOUS | Status: DC

## 2015-01-14 MED ORDER — ASPIRIN 325 MG PO TABS
325.0000 mg | ORAL_TABLET | Freq: Every day | ORAL | Status: DC
  Administered 2015-01-15 – 2015-01-17 (×4): 325 mg via ORAL
  Filled 2015-01-14 (×3): qty 1

## 2015-01-14 MED ORDER — ENOXAPARIN SODIUM 40 MG/0.4ML ~~LOC~~ SOLN
40.0000 mg | SUBCUTANEOUS | Status: DC
  Administered 2015-01-15 – 2015-01-16 (×3): 40 mg via SUBCUTANEOUS
  Filled 2015-01-14 (×3): qty 0.4

## 2015-01-14 MED ORDER — STROKE: EARLY STAGES OF RECOVERY BOOK
Freq: Once | Status: AC
  Administered 2015-01-15

## 2015-01-14 MED ORDER — INSULIN ASPART 100 UNIT/ML ~~LOC~~ SOLN
0.0000 [IU] | Freq: Three times a day (TID) | SUBCUTANEOUS | Status: DC
  Administered 2015-01-15 (×2): 2 [IU] via SUBCUTANEOUS
  Administered 2015-01-15: 1 [IU] via SUBCUTANEOUS
  Administered 2015-01-16: 2 [IU] via SUBCUTANEOUS
  Administered 2015-01-16: 1 [IU] via SUBCUTANEOUS
  Administered 2015-01-16 – 2015-01-17 (×2): 2 [IU] via SUBCUTANEOUS
  Administered 2015-01-17: 5 [IU] via SUBCUTANEOUS
  Administered 2015-01-17 – 2015-01-18 (×2): 2 [IU] via SUBCUTANEOUS
  Administered 2015-01-18: 3 [IU] via SUBCUTANEOUS
  Administered 2015-01-18: 2 [IU] via SUBCUTANEOUS

## 2015-01-14 MED ORDER — SENNOSIDES-DOCUSATE SODIUM 8.6-50 MG PO TABS: 1.0000 | ORAL_TABLET | Freq: Every evening | ORAL | Status: DC | PRN

## 2015-01-14 NOTE — H&P (Addendum)
[facility] Admission History and Physical       Valerie Pham ZOX:[id number] DOB: 09-25-1927 DOA: 01/14/2015  Referring physician: EDP PCP: Valerie RichmondAVIS,JAMES W, MD  Specialists:   Chief Complaint: Slurred Speech and Left Sided Weakness  HPI: Valerie Pham is a 81 y.o. female with a history of HTN, DM2, Hypothyroid, and Galucoma who was admitted to [facility] on 12/15 for symptoms of left sided facial droop, slurred speech and left sided weakness.  An MRI of the Brain revealed a Right Pontine CVA.  In the AM today her symptoms appeared to be worsening and her doctor was concerned that she may have progression of her CVA and MRI services would not be available until Monday, so her family wanted her transferred to [facility].  Her symptoms over the day began to improve some, and she was able to lift her left arm but has 4/5 strength.  Her left leg is weaker than her left arm, and she continues to have slurring of her speech.       Review of Systems:    Constitutional: No Weight Loss, No Weight Gain, Night Sweats, Fevers, Chills, Dizziness, Light Headedness, Fatigue, or Generalized Weakness HEENT: No Headaches, Difficulty Swallowing,Tooth/Dental Problems,Sore Throat,  No Sneezing, Rhinitis, Ear Ache, Nasal Congestion, or Post Nasal Drip,  Cardio-vascular:  No Chest pain, Orthopnea, PND, Edema in Lower Extremities, Anasarca, Dizziness, Palpitations  Resp: No Dyspnea, No DOE, No Productive Cough, No Non-Productive Cough, No Hemoptysis, No Wheezing.    GI: No Heartburn, Indigestion, Abdominal Pain, Nausea, Vomiting, Diarrhea, Constipation, Hematemesis, Hematochezia, Melena, Change in Bowel Habits,  Loss of Appetite  GU: No Dysuria, No Change in Color of Urine, No Urgency or Urinary Frequency, No Flank pain.  Musculoskeletal: No Joint Pain or Swelling, No Decreased Range of Motion, No Back Pain.  Neurologic: No Syncope, No Seizures, Muscle Weakness, Paresthesia, Vision  Disturbance or Loss, No Diplopia, No Vertigo, No Difficulty Walking,  Skin: No Rash or Lesions. Psych: No Change in Mood or Affect, No Depression or Anxiety, No Memory loss, No Confusion, or Hallucinations   Past Medical History:  Essential HTN  Type II DM  Hypothyroid  Glaucoma   Past Surgical History:  Hysterectomy  Tonsillectomy  Appendectomy  Cataract Extraction (Bilateral)    Prior to Admission medications   Not on File       To Be Verified and Listed by Pharmacy   Allergies:     PCN   Social History:  has no tobacco, alcohol, and drug history on file.     Family History:    Mother - CVA, HTN  Father - CAD   Physical Exam:  GEN:  Pleasant Obese Elderly  81 y.o.  Caucasian female examined and in no acute distress; cooperative with exam Filed Vitals:   01/14/15 2012  BP: 193/89  Pulse: 76  Temp: 98.4 F (36.9 C)  TempSrc: Oral  Resp: 19  Height: 5\' 7"  (1.702 m)  Weight: 99 kg (218 lb 4.1 oz)  SpO2: 95%   Blood pressure 193/89, pulse 76, temperature 98.4 F (36.9 C), temperature source Oral, resp. rate 19, height 5\' 7"  (1.702 m), weight 99 kg (218 lb 4.1 oz), SpO2 95 %. PSYCH: She is alert and oriented x4; does not appear anxious does not appear depressed; affect is normal HEENT: Normocephalic and Atraumatic, Mucous membranes pink; PERRLA; EOM intact; Fundi:  Benign;  No scleral icterus, Nares: Patent, Oropharynx: Clear, Fair Dentition,    Neck:  FROM, No  Cervical Lymphadenopathy nor Thyromegaly or Carotid Bruit; No JVD; Breasts:: Not examined CHEST WALL: No tenderness CHEST: Normal respiration, clear to auscultation bilaterally HEART: Regular rate and rhythm; no murmurs rubs or gallops BACK: No kyphosis or scoliosis; No CVA tenderness ABDOMEN: Positive Bowel Sounds, Obese, Soft Non-Tender, No Rebound or Guarding; No Masses, No Organomegaly, No Pannus; No Intertriginous candida. Rectal Exam: Not done EXTREMITIES: No Cyanosis, Clubbing, or Edema; No  Ulcerations. Genitalia: not examined PULSES: 2+ and symmetric SKIN: Normal hydration no rash or ulceration CNS:  Alert and Oriented x 4, No Focal Deficits Mental Status:  Alert, Oriented, Thought Content Appropriate. Speech Fluent without evidence of Aphasia. Able to follow 3 step commands without difficulty.  In No obvious pain.   Cranial Nerves:  II: Discs flat bilaterally; Visual fields Intact, Pupils equal and reactive.    III,IV, VI: Extra-ocular motions intact bilaterally    V,VII: smile symmetric, facial light touch sensation normal bilaterally    VIII: hearing intact    IX,X: gag reflex present    XI: bilateral shoulder shrug    XII: midline tongue extension   Motor:  Right:  Upper extremity 5/5     Left:  Upper extremity 4/5     Right:  Lower extremity 5/5    Left:  Lower extremity 4 MINUS/5     Tone and Bulk:  normal tone throughout; no atrophy noted   Sensory:  Pinprick and light touch intact throughout, bilaterally   Deep Tendon Reflexes: 2+ and symmetric throughout   Plantars/ Babinski:  Right: normal Left:  normal    Cerebellar:  Finger to nose without difficulty.   Gait: deferred    Vascular: pulses palpable throughout    Labs on Admission:  Basic Metabolic Panel: No results for input(s): NA, K, CL, CO2, GLUCOSE, BUN, CREATININE, CALCIUM, MG, PHOS in the last 168 hours. Liver Function Tests: No results for input(s): AST, ALT, ALKPHOS, BILITOT, PROT, ALBUMIN in the last 168 hours. No results for input(s): LIPASE, AMYLASE in the last 168 hours. No results for input(s): AMMONIA in the last 168 hours. CBC: No results for input(s): WBC, NEUTROABS, HGB, HCT, MCV, PLT in the last 168 hours. Cardiac Enzymes: No results for input(s): CKTOTAL, CKMB, CKMBINDEX, TROPONINI in the last 168 hours.  BNP (last 3 results) No results for input(s): BNP in the last 8760 hours.  ProBNP (last 3 results) No results for input(s): PROBNP in the last 8760 hours.  CBG: No  results for input(s): GLUCAP in the last 168 hours.  Radiological Exams on Admission: No results found.   EKG: Independently reviewed.        Assessment/Plan:      81 y.o. female with  Principal Problem:   1.      CVA (cerebral vascular accident) [facility])   Repeat MRI of Brain tonight   Neuro Checks   Check Fasting Lipids and Hb A1C   ASA Rx   Notified  Neurology of arrival   Active Problems:   2.      Essential hypertension   PRN IV Hydralazine   Verify home Meds     3.      DM type 2 (diabetes mellitus, type 2) (HCC)   SSI coverage PRN   Check HBA1C      4.      Hypothyroidism   Continue Levothyroxine Rx   Check TSH level     5.      DVT Prophylaxis    Lovenox  Code Status:     FULL CODE       Family Communication:   Family at Bedside     Disposition Plan:    Inpatient  Observation Status        Time spent:   58 Minutes      Ron Parker [facility] Pager [phone]   If 7AM -7PM Please Contact the Day Rounding Team MD for [facility]  If 7PM-7AM, Please Contact Night-Floor Coverage  [email or web address] Password [email or web address] 01/14/2015, 10:05 PM     ADDENDUM:   Patient was seen and examined on 01/14/2015

## 2015-01-14 NOTE — Progress Notes (Signed)
From Dr. Santina Evansatherine Coe/ Dr. Marvis MoellerMiles at [facility]  This is in a 81-year-old female history of hypertension, hyperlipidemia, diabetes mellitus, hypothyroidism presented to the emergency department on 01/13/2015 with slurred speech and gait difficulty. CT of the head was negative at that time. MRI did show a right pontine stroke. Patient's infant had begun to improve however upon seeing the patient this morning 01/14/2015, patient was noted to have left-sided weakness is progressing. Currently 4-5 on the left side with some mild left-sided facial droop. Reason for transfer, is to obtain repeat MRI to evaluate for progressive CVA.  [facility] not [facility] available until Monday. Transferring physician did speak with neurology here at [facility] who said they would accept the patient in consult. Have asked for all documentation in studies to be sent with the patient. Currently vital signs are stable she's alert and oriented.    Patient accepted to neuro telemetry bed as an inpatient. Please make neurology aware patient's arrival.  Time spent: 10 minutes  Porchea Charrier D.O. [facility] Pager [phone]  If 7PM-7AM, please contact night-coverage [email or web address] Password [email or web address] 01/14/2015, 5:40 PM

## 2015-01-15 ENCOUNTER — Inpatient Hospital Stay (HOSPITAL_COMMUNITY): Payer: Medicare Other

## 2015-01-15 DIAGNOSIS — I639 Cerebral infarction, unspecified: Secondary | ICD-10-CM

## 2015-01-15 DIAGNOSIS — I6789 Other cerebrovascular disease: Secondary | ICD-10-CM

## 2015-01-15 LAB — COMPREHENSIVE METABOLIC PANEL
ALT: 12 U/L — ABNORMAL LOW (ref 14–54)
ANION GAP: 10 (ref 5–15)
AST: 13 U/L — ABNORMAL LOW (ref 15–41)
Albumin: 3.6 g/dL (ref 3.5–5.0)
Alkaline Phosphatase: 54 U/L (ref 38–126)
BUN: 20 mg/dL (ref 6–20)
CHLORIDE: 103 mmol/L (ref 101–111)
CO2: 27 mmol/L (ref 22–32)
CREATININE: 1 mg/dL (ref 0.44–1.00)
Calcium: 9.2 mg/dL (ref 8.9–10.3)
GFR, EST AFRICAN AMERICAN: 58 mL/min — AB (ref >60)
GFR, EST NON AFRICAN AMERICAN: 50 mL/min — AB (ref >60)
Glucose, Bld: 224 mg/dL — ABNORMAL HIGH (ref 65–99)
POTASSIUM: 3.2 mmol/L — AB (ref 3.5–5.1)
SODIUM: 140 mmol/L (ref 135–145)
Total Bilirubin: 0.9 mg/dL (ref 0.3–1.2)
Total Protein: 6.9 g/dL (ref 6.5–8.1)

## 2015-01-15 LAB — BASIC METABOLIC PANEL
Anion gap: 9 (ref 5–15)
BUN: 24 mg/dL — AB (ref 6–20)
CO2: 26 mmol/L (ref 22–32)
CREATININE: 0.73 mg/dL (ref 0.44–1.00)
Calcium: 9 mg/dL (ref 8.9–10.3)
Chloride: 106 mmol/L (ref 101–111)
GFR calc Af Amer: >60 mL/min (ref >60)
GLUCOSE: 159 mg/dL — AB (ref 65–99)
POTASSIUM: 3.1 mmol/L — AB (ref 3.5–5.1)
SODIUM: 141 mmol/L (ref 135–145)

## 2015-01-15 LAB — CBC
HCT: 42 % (ref 36.0–46.0)
Hemoglobin: 13.8 g/dL (ref 12.0–15.0)
MCH: 29.7 pg (ref 26.0–34.0)
MCHC: 32.9 g/dL (ref 30.0–36.0)
MCV: 90.5 fL (ref 78.0–100.0)
PLATELETS: 239 10*3/uL (ref 150–400)
RBC: 4.64 MIL/uL (ref 3.87–5.11)
RDW: 14.8 % (ref 11.5–15.5)
WBC: 12.7 10*3/uL — ABNORMAL HIGH (ref 4.0–10.5)

## 2015-01-15 LAB — URINE MICROSCOPIC-ADD ON

## 2015-01-15 LAB — GLUCOSE, CAPILLARY
GLUCOSE-CAPILLARY: 145 mg/dL — AB (ref 65–99)
GLUCOSE-CAPILLARY: 199 mg/dL — AB (ref 65–99)
Glucose-Capillary: 191 mg/dL — ABNORMAL HIGH (ref 65–99)
Glucose-Capillary: 165 mg/dL — ABNORMAL HIGH (ref 65–99)

## 2015-01-15 LAB — URINALYSIS, ROUTINE W REFLEX MICROSCOPIC
BILIRUBIN URINE: NEGATIVE
Glucose, UA: NEGATIVE mg/dL
HGB URINE DIPSTICK: NEGATIVE
KETONES UR: NEGATIVE mg/dL
Nitrite: NEGATIVE
PH: 5 (ref 5.0–8.0)
Protein, ur: NEGATIVE mg/dL
SPECIFIC GRAVITY, URINE: 1.01 (ref 1.005–1.030)

## 2015-01-15 LAB — LIPID PANEL
CHOL/HDL RATIO: 3.8 ratio
CHOLESTEROL: 134 mg/dL (ref 0–200)
HDL: 35 mg/dL — ABNORMAL LOW (ref >40)
LDL Cholesterol: 72 mg/dL (ref 0–99)
Triglycerides: 136 mg/dL (ref <150)
VLDL: 27 mg/dL (ref 0–40)

## 2015-01-15 LAB — MAGNESIUM: MAGNESIUM: 1.5 mg/dL — AB (ref 1.7–2.4)

## 2015-01-15 MED ORDER — ATORVASTATIN CALCIUM 10 MG PO TABS
20.0000 mg | ORAL_TABLET | Freq: Every day | ORAL | Status: DC
  Administered 2015-01-15 – 2015-01-18 (×4): 20 mg via ORAL
  Filled 2015-01-15 (×5): qty 2

## 2015-01-15 MED ORDER — LEVOTHYROXINE SODIUM 50 MCG PO TABS
75.0000 ug | ORAL_TABLET | Freq: Every day | ORAL | Status: DC
  Administered 2015-01-16 – 2015-01-18 (×3): 75 ug via ORAL
  Filled 2015-01-15 (×3): qty 1

## 2015-01-15 MED ORDER — POTASSIUM CHLORIDE CRYS ER 20 MEQ PO TBCR
20.0000 meq | EXTENDED_RELEASE_TABLET | Freq: Two times a day (BID) | ORAL | Status: AC
  Administered 2015-01-15 – 2015-01-16 (×4): 20 meq via ORAL
  Filled 2015-01-15 (×3): qty 1

## 2015-01-15 NOTE — Progress Notes (Signed)
STROKE TEAM PROGRESS NOTE   HISTORY Valerie Pham is a 81 y.o. female who was last seen well 12/15 and developed slurred speech and difficult walking. She presented to [facility] in [place] where an MRI was performed which showed a right pontine infarct. She had some mild worsening overnight and therefore was transferred for neurological consultation to [facility]. Her son states that since this morning, she appears to have made improvements. She states that she feels this way as well.  LKW: 12/15 tpa given?: no, outside of window    SUBJECTIVE (INTERVAL HISTORY) The patient's son is present. The patient feels her speech is improving. She reports some "bladder problems" but denies dysuria. She continues to have mild left upper extremity weakness and a mild left facial droop.   OBJECTIVE Temp:  [98.1 F (36.7 C)-98.6 F (37 C)] 98.1 F (36.7 C) (12/17 1408) Pulse Rate:  [72-98] 88 (12/17 1408) Cardiac Rhythm:  [-] Normal sinus rhythm;Heart block (12/17 0700) Resp:  [18-21] 20 (12/17 1408) BP: (155-193)/(63-93) 155/63 mmHg (12/17 1408) SpO2:  [95 %-98 %] 98 % (12/17 1408) Weight:  [99 kg (218 lb 4.1 oz)] 99 kg (218 lb 4.1 oz) (12/16 2012)  CBC: No results for input(s): WBC, NEUTROABS, HGB, HCT, MCV, PLT in the last 168 hours.  Basic Metabolic Panel:  Recent Labs Lab 01/15/15 0139  NA 141  K 3.1*  CL 106  CO2 26  GLUCOSE 159*  BUN 24*  CREATININE 0.73  CALCIUM 9.0    Lipid Panel:    Component Value Date/Time   CHOL 134 01/15/2015 0139   TRIG 136 01/15/2015 0139   HDL 35* 01/15/2015 0139   CHOLHDL 3.8 01/15/2015 0139   VLDL 27 01/15/2015 0139   LDLCALC 72 01/15/2015 0139   HgbA1c: No results found for: HGBA1C Urine Drug Screen: No results found for: LABOPIA, COCAINSCRNUR, LABBENZ, AMPHETMU, THCU, LABBARB    IMAGING   [facility] Studies:  MRI HEAD:  Acute 4 x 14 mm RIGHT pontine ischemia.  Involutional changes. Moderate chronic small  vessel ischemicdisease.    MRA HEAD:  Generalized decreased signal to noise ratio limitsevaluation of vertebral artery's without acute large vessel  occlusion or definite high-grade stenosis. If clinically indicated,repeat exam could be performed versus CTA head.    MRA NECK: Moderate bilateral V1 stenosis versus artifact. Noconvincing evidence of high-grade stenosis with aforementioned limitations.          PHYSICAL EXAM Mental Status: pleasant 81 year old female alert and sociable. Patient is awake, alert, oriented to person, place, month, year, and situation. No signs of aphasia or neglect Cranial Nerves: II: Visual Fields are full. Pupils are equal, round, and reactive to light.  III,IV, VI: EOMI without ptosis or diploplia.  V: Facial sensation is symmetric to temperature VII: mild left lower facial droop VIII: hearing is intact to voice X: Uvula elevates symmetrically XI: Shoulder shrug is symmetric. XII: tongue is midline without atrophy or fasciculations.  Motor: Tone is normal. Bulk is normal. Strength - 5/5 right upper and lower extremities. 4/5 left upper and lower extremities. Sensory: Sensation is symmetric to light touch and temperature in the arms and legs. Cerebellar: Finger to nose performed with some difficulty on the left.    ASSESSMENT/PLAN Valerie Pham is a 81 y.o. female with history of Hypertension, hyperlipidemia, atrial flutter,diabetes, and hypothyroidism presenting in transfer from [facility] in [place] with a pontine infarct. She did not receive IV t-PA due to late  presentation.  Stroke:  Non-dominant infarct secondary to small vessel disease.  Resultant left hemiparesis  MRI  See above  MRA  See above  Carotid Doppler - refer to MRA of the neck  2D Echo - EF 65-70%. No cardiac source of emboli identified.  LDL - 72  HgbA1c pending  VTE prophylaxis - Lovenox  Diet heart healthy/carb modified Room  service appropriate?: Yes; Fluid consistency:: Thin  No antithrombotic prior to admission, now on aspirin 325 mg daily  Patient counseled to be compliant with her antithrombotic medications  Ongoing aggressive stroke risk factor management  Therapy recommendations: pending  Disposition: pending  Hypertension  Blood pressure is mildly high at times Permissive hypertension (OK if < 220/120) but gradually normalize in 5-7 days  Hyperlipidemia  Home meds:  Lipitor 20 mg daily resumed in hospital - will order  LDL 72, goal < 70  Continue statin at discharge  Diabetes  HgbA1c Pending, goal < 7.0  Unc / Controlled - await hemoglobin A1c results  Other Stroke Risk Factors  Advanced age  Former smoker  Obesity, Body mass index is 34.18 kg/(m^2).   Family hx stroke (mother)  History of atrial flutter - not sure patient would be a candidate for anticoagulation. Will order telemetry.   Other Active Problems  Hypokalemia - supplement  Mildly elevated BUN - encourage fluids  Check a urinalysis - patient reports "bladder problems" . Strong smelling urine.  Hospital day # 1  Delton See PA-C [facility] Pager [phone] 01/15/2015, 3:01 PM  I have personally examined this patient, reviewed notes, independently viewed imaging studies, participated in medical decision making and plan of care. I have made any additions or clarifications directly to the above note. Agree with note above.   Pt/ot Case management/social work as likely needs assistance. Lives with husband.   Valerie Pham    To contact Stroke Continuity provider, please refer to [email or web address]. After hours, contact General Neurology

## 2015-01-15 NOTE — Progress Notes (Signed)
  Echocardiogram 2D Echocardiogram has been performed.  Valerie Pham, Valerie Pham M 01/15/2015, 9:28 AM

## 2015-01-15 NOTE — Consult Note (Signed)
Neurology Consultation Reason for Consult: stroke Referring Physician: Lovell Sheehanjenkins, H    CC: Stroke  History is obtained from: Patient, son  HPI: Valerie Pham is a 81 y.o. female who was last seen well 12/15 and developed slurred speech and difficult walking. She presented to [facility] [place] where an MRI was performed which showed a pontine infarct. She had some mild worsening overnight and therefore was transferred for neurological consultation to [facility].  Her son states that since this morning, she appears to have made improvements. She states that she feels this way as well.   LKW: 12/15 tpa given?: no, outside of window    ROS: A 14 point ROS was performed and is negative except as noted in the HPI.   Past Medical History  Diagnosis Date  . Benign essential HTN   . DM2 (diabetes mellitus, type 2) (HCC)   . Hypothyroid   . Glaucoma      Family History  Problem Relation Age of Onset  . Stroke Mother   . Hypertension Mother   . CAD Father      Social History:  has no tobacco, alcohol, and drug history on file.   Exam: Current vital signs: BP 183/72 mmHg  Pulse 72  Temp(Src) 98.6 F (37 C) (Oral)  Resp 18  Ht 5\' 7"  (1.702 m)  Wt 99 kg (218 lb 4.1 oz)  BMI 34.18 kg/m2  SpO2 97% Vital signs in last 24 hours: Temp:  [98.4 F (36.9 C)-98.6 F (37 C)] 98.6 F (37 C) (12/16 2200) Pulse Rate:  [72-78] 72 (12/16 2200) Resp:  [18-19] 18 (12/16 2200) BP: (180-193)/(72-91) 183/72 mmHg (12/16 2200) SpO2:  [95 %-98 %] 97 % (12/16 2200) Weight:  [99 kg (218 lb 4.1 oz)] 99 kg (218 lb 4.1 oz) (12/16 2012)   Physical Exam  Constitutional: Appears well-developed and well-nourished.  Psych: Affect appropriate to situation Eyes: No scleral injection HENT: No OP obstrucion Head: Normocephalic.  Cardiovascular: Normal rate and regular rhythm.  Respiratory: Effort normal and breath sounds normal to anterior ascultation GI: Soft.  No  distension. There is no tenderness.  Skin: WDI  Neuro: Mental Status: Patient is awake, alert, oriented to person, place, month, year, and situation. Patient is able to give a clear and coherent history. No signs of aphasia or neglect Cranial Nerves: II: Visual Fields are full. Pupils are equal, round, and reactive to light.   III,IV, VI: EOMI without ptosis or diploplia.  V: Facial sensation is symmetric to temperature VII: Facial movement is decreased on the left lower face very mildly VIII: hearing is intact to voice X: Uvula elevates symmetrically XI: Shoulder shrug is symmetric. XII: tongue is midline without atrophy or fasciculations.  Motor: Tone is normal. Bulk is normal. 5/5 strength was present on the right side, on the left she has 4/5 strength in the arm and leg, arm affected slightly more than leg Sensory: Sensation is symmetric to light touch and temperature in the arms and legs. Cerebellar: She appears to have some discoordination of both her left arm and leg slightly out of proportion to weakness.      I have reviewed the images obtained: MRI of the brain, MRA of the head and neck- pontine infarct. I have a question of some stenosis in the right vertebral, but I suspect that this is artifactual.  Impression: 81 year old female with pontine infarct. She will need to be evaluated for secondary stroke prevention and work with physical  therapy and occupational therapy.  Recommendations: 1. HgbA1c, fasting lipid panel 2. Frequent neuro checks 3. Echocardiogram 4. Prophylactic therapy-Antiplatelet med: Aspirin - dose  PO or  PR 5. Risk factor modification 6. Telemetry monitoring 7. PT consult, OT consult, Speech consult 8. Obtain outside imaging reports   Ritta Slot, MD [facility] [phone]  If 7pm- 7am, please page neurology on call as listed in AMION.

## 2015-01-15 NOTE — Progress Notes (Signed)
Incontinent x 4 large amounts.  PVR x 2 with bladder scan = 196 & 200.  Patient stated she was having periods of urgency & frequency at home.  Urine odorous, sightlyl cloudy when used bedpan x 1. Pt OOB x 2 to attempt voiding with max 2 assist, utilizing gait belt & stedy.  Will continue toileting schedule.

## 2015-01-15 NOTE — Progress Notes (Addendum)
PROGRESS NOTE    Valerie Pham:[id number] DOB: 1927/11/11 DOA: 01/14/2015 PCP: Carmin Richmond, MD  HPI/Brief narrative  81 year old female with history of HTN, DM 2, hypothyroid, glaucoma was admitted to [facility] on 01/13/15 for symptoms of left sided facial droop, slurred speech and left-sided weakness. MRI of brain revealed right pontine CVA. The next day, her symptoms appear to worsen and her physicians were concerned that she may have progression of her CVA and MRI services were not available until Monday and hence on family's request, patient was transferred to [facility]. Her symptoms progressively improved. Neurology consulted.   Assessment/Plan:  Stroke: Non-dominant infarct secondary to small vessel disease. Resultant left hemiparesis and dysarthria MRI  Acute 4 x 14 right pontine ischemia MRA head  No acute large vessel occlusion or definite high-grade stenosis MRA neck  Moderate bilateral V1 stenosis versus artifact. No convincing evidence of high-grade stenosis Carotid Doppler - refer to MRA of the neck 2D Echo - EF 65-70%. No cardiac source of emboli identified. LDL - 72 HgbA1c pending No antithrombotic prior to admission, now on aspirin 325 mg daily Patient counseled to be compliant with her antithrombotic medications Ongoing aggressive stroke risk factor management Therapy recommendations: pending Disposition: pending  Essential hypertension -  Uncontrolled. Allow for permissive hypertension. Currently holding metoprolol, amlodipine and Cozaar.  Hyperlipidemia -  LDL 72, goal <70 -  Patient on Lipitor 20 MG daily at home, continue discharge  Type II DM -  Follow A1c. Continue SSI. Reasonable inpatient control. Holding oral hypoglycemics.   Hypokalemia -  Replace and follow labs in a.m.  ? H/o Atrial Flutter - per Neurology notes - no details available. SR on monitor.   Hypothyroid -  Continue Synthroid.   DVT prophylaxis: Lovenox Code Status:   Full Family Communication:  None at bedside Disposition Plan:  To be determined   Consultants:  Neurology  Procedures:   2-D echo 01/15/15: Study Conclusions  - Left ventricle: The cavity size was normal. Wall thickness was normal. Systolic function was vigorous. The estimated ejection fraction was in the range of 65% to 70%. Wall motion was normal; there were no regional wall motion abnormalities. Doppler parameters are consistent with abnormal left ventricular relaxation (grade 1 diastolic dysfunction). Doppler parameters are consistent with high ventricular filling pressure. - Aortic valve: Valve mobility was mildly restricted. There was very mild stenosis. Peak velocity (S): 219 cm/s. Mean gradient (S): 11 mm Hg. Valve area (VTI): 1.58 cm^2. - Mitral valve: Moderately calcified annulus. - Left atrium: The atrium was mildly dilated.  Impressions:  - No cardiac source of emboli was indentified.  Antibiotics:  None   Subjective:  slurred speech has not significantly improved. Left-sided weakness has improved. Left leg is weaker than arm.  Objective: Filed Vitals:   01/15/15 0600 01/15/15 1007 01/15/15 1358 01/15/15 1408  BP: 187/78  157/93 155/63  Pulse: 78 89 98 88  Temp: 98.6 F (37 C) 98.2 F (36.8 C) 98.4 F (36.9 C) 98.1 F (36.7 C)  TempSrc: Oral Oral Oral Oral  Resp: Height:      Weight:      SpO2: 98%  95% 98%   No intake or output data in the 24 hours ending 01/15/15 1537 Filed Weights   01/14/15 2012  Weight: 99 kg (218 lb 4.1 oz)     Exam:  General exam:  Pleasant elderly female lying comfortably in bed Respiratory system: Clear. No increased work of breathing.  Cardiovascular system: S1 & S2 heard, RRR. No JVD, murmurs, gallops, clicks or pedal edema. Telemetry: sinus rhythm with BBB morphology. Gastrointestinal system: Abdomen is nondistended, soft and nontender. Normal bowel sounds heard. Central nervous  system: Alert and oriented.  Dysarthria +, facial asymmetry +. Extremities:  5 x 5 power in right limbs. Grade 4 x 5 power in left upper extremity and 3 x 5 power in the left lower extremity.   Data Reviewed: Basic Metabolic Panel:  Recent Labs Lab 01/15/15 0139  NA 141  K 3.1*  CL 106  CO2 26  GLUCOSE 159*  BUN 24*  CREATININE 0.73  CALCIUM 9.0   Liver Function Tests: No results for input(s): AST, ALT, ALKPHOS, BILITOT, PROT, ALBUMIN in the last 168 hours. No results for input(s): LIPASE, AMYLASE in the last 168 hours. No results for input(s): AMMONIA in the last 168 hours. CBC: No results for input(s): WBC, NEUTROABS, HGB, HCT, MCV, PLT in the last 168 hours. Cardiac Enzymes: No results for input(s): CKTOTAL, CKMB, CKMBINDEX, TROPONINI in the last 168 hours. BNP (last 3 results) No results for input(s): PROBNP in the last 8760 hours. CBG:  Recent Labs Lab 01/15/15 0642 01/15/15 1150  GLUCAP 145* 191*    No results found for this or any previous visit (from the past 240 hour(s)).         Studies: No results found.      Scheduled Meds: . aspirin  325 mg Oral Daily  . atorvastatin  20 mg Oral q1800  . enoxaparin (LOVENOX) injection  40 mg Subcutaneous Q24H  . insulin aspart  0-5 Units Subcutaneous QHS  . insulin aspart  0-9 Units Subcutaneous TID WC  . potassium chloride  20 mEq Oral BID   Continuous Infusions:   Principal Problem:   CVA (cerebral vascular accident) (HCC) Active Problems:   Essential hypertension   DM type 2 (diabetes mellitus, type 2) (HCC)   Hypothyroidism   CVA (cerebral infarction)    Time spent: 30 minutes.    Marcellus ScottHONGALGI,Dasani Crear, MD, FACP, FHM. [facility] Pager [phone]  If 7PM-7AM, please contact night-coverage [email or web address] Password [email or web address] 01/15/2015, 3:37 PM    LOS: 1 day

## 2015-01-15 NOTE — Evaluation (Signed)
Physical Therapy Evaluation Patient Details Name: Valerie MangoSara H Moeller MRN: [id number] DOB: 24-Feb-1927 Today's Date: 01/15/2015   History of Present Illness  81 yo  female from [place] was admitted and has R pontine CVA with L side weakness, slurred speech, difficulty with focus on PT instructions  Clinical Impression  Pt was seen for assessment of bedside sitting and then to chair.  Follows instructions slowly and seems to need extra processing time.  Her plan is to get her evaluated for inpt rehab and then hopefully can get back home alone.    Follow Up Recommendations CIR    Equipment Recommendations  Rolling walker with 5" wheels    Recommendations for Other Services Rehab consult     Precautions / Restrictions Precautions Precautions: Fall (telemetry) Precaution Comments: pulses are high, 101 at start to 135 at end of transition to chair Restrictions Weight Bearing Restrictions: No      Mobility  Bed Mobility Overal bed mobility: Needs Assistance Bed Mobility: Supine to Sit     Supine to sit: Min assist;Mod assist     General bed mobility comments: min assist to get legs over and cue sequence, mod to lift under trunk  Transfers Overall transfer level: Needs assistance Equipment used: 1 person hand held assist Transfers: Sit to/from [employer];Stand Pivot Transfers Sit to Stand: Mod assist;From elevated surface (to power up from each extremity) Stand pivot transfers: Mod assist;Min assist;From elevated surface (with gait belt and PT)          Ambulation/Gait Ambulation/Gait assistance: Mod assist;Min assist Ambulation Distance (Feet): 3 Feet Assistive device: 1 person hand held assist Gait Pattern/deviations: Step-to pattern;Leaning posteriorly;Ataxic;Shuffle;Wide base of support Gait velocity: reduced, halting Gait velocity interpretation: Below normal speed for age/gender    Stairs            Wheelchair Mobility    Modified Rankin (Stroke Patients  Only) Modified Rankin (Stroke Patients Only) Pre-Morbid Rankin Score: No significant disability Modified Rankin: Moderately severe disability     Balance Overall balance assessment: Needs assistance Sitting-balance support: Feet supported Sitting balance-Leahy Scale: Fair   Postural control: Posterior lean Standing balance support: Single extremity supported Standing balance-Leahy Scale: Poor                               Pertinent Vitals/Pain Pain Assessment: No/denies pain    Home Living Family/patient expects to be discharged to:: Inpatient rehab Living Arrangements: Alone                    Prior Function Level of Independence: Independent (had caregivers for 5 hours a day for help of activities )               Hand Dominance        Extremity/Trunk Assessment   Upper Extremity Assessment: Generalized weakness           Lower Extremity Assessment: Generalized weakness      Cervical / Trunk Assessment: Normal  Communication   Communication: Expressive difficulties;Other (comment) (slurred speech)  Cognition Arousal/Alertness: Lethargic Behavior During Therapy: Flat affect Overall Cognitive Status: Impaired/Different from baseline Area of Impairment: Attention;Memory;Following commands;Safety/judgement;Awareness;Problem solving;Orientation Orientation Level: Situation;Time Current Attention Level: Selective Memory: Decreased short-term memory;Decreased recall of precautions Following Commands: Follows one step commands inconsistently Safety/Judgement: Decreased awareness of safety;Decreased awareness of deficits Awareness: Intellectual Problem Solving: Slow processing;Decreased initiation;Difficulty sequencing;Requires verbal cues;Requires tactile cues      General Comments General  comments (skin integrity, edema, etc.): Pt is with her son and his family who are informative and supportive.    Exercises         Assessment/Plan    PT Assessment Patient needs continued PT services  PT Diagnosis Generalized weakness;Hemiplegia non-dominant side   PT Problem List Decreased strength;Decreased range of motion;Decreased activity tolerance;Decreased balance;Decreased mobility;Decreased coordination;Decreased cognition;Decreased safety awareness;Decreased knowledge of use of DME;Cardiopulmonary status limiting activity;Obesity  PT Treatment Interventions DME instruction;Gait training;Functional mobility training;Therapeutic activities;Therapeutic exercise;Balance training;Neuromuscular re-education;Patient/family education   PT Goals (Current goals can be found in the Care Plan section) Acute Rehab PT Goals Patient Stated Goal: to get up to walk again PT Goal Formulation: With patient/family Time For Goal Achievement: 01/29/15 Potential to Achieve Goals: Good    Frequency Min 4X/week   Barriers to discharge Inaccessible home environment;Decreased caregiver support home alone    Co-evaluation               End of Session Equipment Utilized During Treatment: Gait belt Activity Tolerance: Patient tolerated treatment well;Patient limited by fatigue Patient left: in chair;with call bell/phone within reach;with family/visitor present Nurse Communication: Mobility status         Time: 1610-9604 PT Time Calculation (min) (ACUTE ONLY): 24 min   Charges:   PT Evaluation $Initial PT Evaluation Tier I: 1 Procedure PT Treatments $Therapeutic Activity: 8-22 mins   PT G CodesIvar Drape 02-05-15, 4:07 PM   Samul Dada, PT MS Acute Rehab Dept. Number: [facility] [id number] and [facility] [phone]

## 2015-01-15 NOTE — Plan of Care (Signed)
Problem: Acute Rehab PT Goals(only PT should resolve) Goal: Pt Will Transfer Bed To Chair/Chair To Bed With [facility]     

## 2015-01-16 ENCOUNTER — Encounter (HOSPITAL_COMMUNITY): Payer: Self-pay | Admitting: *Deleted

## 2015-01-16 DIAGNOSIS — E876 Hypokalemia: Secondary | ICD-10-CM

## 2015-01-16 LAB — GLUCOSE, CAPILLARY
GLUCOSE-CAPILLARY: 147 mg/dL — AB (ref 65–99)
GLUCOSE-CAPILLARY: 173 mg/dL — AB (ref 65–99)
Glucose-Capillary: 190 mg/dL — ABNORMAL HIGH (ref 65–99)
Glucose-Capillary: 195 mg/dL — ABNORMAL HIGH (ref 65–99)

## 2015-01-16 MED ORDER — DORZOLAMIDE HCL-TIMOLOL MAL 2-0.5 % OP SOLN
1.0000 [drp] | Freq: Two times a day (BID) | OPHTHALMIC | Status: DC
Start: 2015-01-16 — End: 2015-01-18
  Administered 2015-01-16 – 2015-01-18 (×4): 1 [drp] via OPHTHALMIC
  Filled 2015-01-16: qty 10

## 2015-01-16 MED ORDER — METOPROLOL TARTRATE 50 MG PO TABS
50.0000 mg | ORAL_TABLET | Freq: Two times a day (BID) | ORAL | Status: DC
  Administered 2015-01-16 – 2015-01-18 (×5): 50 mg via ORAL
  Filled 2015-01-16 (×5): qty 1

## 2015-01-16 MED ORDER — CIPROFLOXACIN HCL 250 MG PO TABS
250.0000 mg | ORAL_TABLET | Freq: Two times a day (BID) | ORAL | Status: DC
  Administered 2015-01-16 – 2015-01-18 (×4): 250 mg via ORAL
  Filled 2015-01-16 (×4): qty 1

## 2015-01-16 NOTE — Progress Notes (Addendum)
PROGRESS NOTE    Valerie Pham ZOX:[id number] DOB: 1927/04/26 DOA: 01/14/2015 PCP: Carmin RichmondAVIS,JAMES W, MD  HPI/Brief narrative  81 year old female with history of HTN, DM 2, hypothyroid, glaucoma was [facility] on 01/13/15 for symptoms of left sided facial droop, slurred speech and left-sided weakness. MRI of brain revealed right pontine CVA. The next day, her symptoms appear to worsen and her physicians were concerned that she may have progression of her CVA and MRI services were not available until Monday and hence on family's request, patient was transferred [facility]. Her symptoms progressively improved. Neurology consulted.   Assessment/Plan:  Stroke: Non-dominant infarct secondary to small vessel disease. Resultant left hemiparesis and dysarthria MRI  Acute 4 x 14 right pontine ischemia MRA head  No acute large vessel occlusion or definite high-grade stenosis MRA neck  Moderate bilateral V1 stenosis versus artifact. No convincing evidence of high-grade stenosis Carotid Doppler - refer to MRA of the neck 2D Echo - EF 65-70%. No cardiac source of emboli identified. LDL - 72 HgbA1c pending No antithrombotic prior to admission, now on aspirin 325 mg daily Patient counseled to be compliant with her antithrombotic medications Ongoing aggressive stroke risk factor management Therapy recommendations: CIR - consulted. Disposition: pending  Essential hypertension -  Uncontrolled. Allow for permissive hypertension. Currently holding metoprolol, amlodipine and Cozaar. Will start Metoprolol  Hyperlipidemia -  LDL 72, goal <70 -  Patient on Lipitor 20 MG daily at home, continue discharge  Type II DM -  Follow A1c. Continue SSI. Reasonable inpatient control. Holding oral hypoglycemics.   Hypokalemia/Hypomagnesemia -  follow labs & replace as needed.  ? H/o Atrial Flutter - per Neurology notes - no details available. SR on monitor.   Hypothyroid -  Continue  Synthroid.   Presumed UTI - Neurology has started 3 days of Cipro. .   DVT prophylaxis: Lovenox Code Status:  Full Family Communication:  None at bedside Disposition Plan:  To be determined   Consultants:  Neurology  Procedures:   2-D echo 01/15/15: Study Conclusions  - Left ventricle: The cavity size was normal. Wall thickness was normal. Systolic function was vigorous. The estimated ejection fraction was in the range of 65% to 70%. Wall motion was normal; there were no regional wall motion abnormalities. Doppler parameters are consistent with abnormal left ventricular relaxation (grade 1 diastolic dysfunction). Doppler parameters are consistent with high ventricular filling pressure. - Aortic valve: Valve mobility was mildly restricted. There was very mild stenosis. Peak velocity (S): 219 cm/s. Mean gradient (S): 11 mm Hg. Valve area (VTI): 1.58 cm^2. - Mitral valve: Moderately calcified annulus. - Left atrium: The atrium was mildly dilated.  Impressions:  - No cardiac source of emboli was indentified.  Antibiotics:  None   Subjective:  slurred speech has not significantly improved. Left-sided weakness has improved. Left leg is weaker than arm.  Objective: Filed Vitals:   01/15/15 1408 01/15/15 2118 01/16/15 0132 01/16/15 0551  BP: 155/63 151/95 189/83 193/88  Pulse: 88 101 92 90  Temp: 98.1 F (36.7 C) 98.1 F (36.7 C) 98.5 F (36.9 C) 97.9 F (36.6 C)  TempSrc: Oral Oral Oral Oral  Resp: 20 18 18 18   Height:      Weight:      SpO2: 98% 96% 97% 97%    Intake/Output Summary (Last 24 hours) at 01/16/15 0749 Last data filed at 01/15/15 1715  Gross per 24 hour  Intake      0 ml  Output  800 ml  Net   -800 ml   Filed Weights   01/14/15 2012  Weight: 99 kg (218 lb 4.1 oz)     Exam:  General exam:  Pleasant elderly female lying comfortably in bed Respiratory system: Clear. No increased work of breathing. Cardiovascular  system: S1 & S2 heard, RRR. No JVD, murmurs, gallops, clicks or pedal edema. Telemetry: sinus rhythm with BBB morphology. Gastrointestinal system: Abdomen is nondistended, soft and nontender. Normal bowel sounds heard. Central nervous system: Alert and oriented.  Dysarthria +, facial asymmetry +. Extremities:  5 x 5 power in right limbs. Grade 4 x 5 power in left upper extremity and 3 x 5 power in the left lower extremity.   Data Reviewed: Basic Metabolic Panel:  Recent Labs Lab 01/15/15 0139 01/15/15 1635  NA 141 140  K 3.1* 3.2*  CL 106 103  CO2 26 27  GLUCOSE 159* 224*  BUN 24* 20  CREATININE 0.73 1.00  CALCIUM 9.0 9.2  MG  --  1.5*   Liver Function Tests:  Recent Labs Lab 01/15/15 1635  AST 13*  ALT 12*  ALKPHOS 54  BILITOT 0.9  PROT 6.9  ALBUMIN 3.6   No results for input(s): LIPASE, AMYLASE in the last 168 hours. No results for input(s): AMMONIA in the last 168 hours. CBC:  Recent Labs Lab 01/15/15 1635  WBC 12.7*  HGB 13.8  HCT 42.0  MCV 90.5  PLT 239   Cardiac Enzymes: No results for input(s): CKTOTAL, CKMB, CKMBINDEX, TROPONINI in the last 168 hours. BNP (last 3 results) No results for input(s): PROBNP in the last 8760 hours. CBG:  Recent Labs Lab 01/15/15 0642 01/15/15 1150 01/15/15 1636 01/15/15 2133 01/16/15 0631  GLUCAP 145* 191* 199* 165* 147*    No results found for this or any previous visit (from the past 240 hour(s)).         Studies: No results found.      Scheduled Meds: . aspirin  325 mg Oral Daily  . atorvastatin  20 mg Oral q1800  . enoxaparin (LOVENOX) injection  40 mg Subcutaneous Q24H  . insulin aspart  0-5 Units Subcutaneous QHS  . insulin aspart  0-9 Units Subcutaneous TID WC  . levothyroxine  75 mcg Oral QAC breakfast  . potassium chloride  20 mEq Oral BID   Continuous Infusions:   Principal Problem:   CVA (cerebral vascular accident) (HCC) Active Problems:   Essential hypertension   DM type 2  (diabetes mellitus, type 2) (HCC)   Hypothyroidism   CVA (cerebral infarction)    Time spent: 30 minutes.    Marcellus Scott, MD, FACP, FHM. [facility] Pager [phone]  If 7PM-7AM, please contact night-coverage [email or web address] Password [email or web address] 01/16/2015, 7:49 AM    LOS: 2 days

## 2015-01-16 NOTE — Progress Notes (Signed)
STROKE TEAM PROGRESS NOTE   HISTORY Valerie Pham is a 81 y.o. female who was last seen well 12/15 and developed slurred speech and difficult walking. She presented to [facility] in [place] where an MRI was performed which showed a right pontine infarct. She had some mild worsening overnight and therefore was transferred for neurological consultation to [facility]. Her son states that since this morning, she appears to have made improvements. She states that she feels this way as well.  LKW: 12/15 tpa given?: no, outside of window    SUBJECTIVE (INTERVAL HISTORY) Multiple family members present. The patient states he feels fine. She believes she is improving.    OBJECTIVE Temp:  [97.9 F (36.6 C)-98.7 F (37.1 C)] 98.7 F (37.1 C) (12/18 0933) Pulse Rate:  [88-101] 90 (12/18 0933) Cardiac Rhythm:  [-] Heart block (12/17 1900) Resp:  [18-21] 20 (12/18 0933) BP: (141-193)/(63-95) 141/82 mmHg (12/18 0933) SpO2:  [95 %-98 %] 97 % (12/18 0933)  CBC:   Recent Labs Lab 01/15/15 1635  WBC 12.7*  HGB 13.8  HCT 42.0  MCV 90.5  PLT 239    Basic Metabolic Panel:   Recent Labs Lab 01/15/15 0139 01/15/15 1635  NA 141 140  K 3.1* 3.2*  CL 106 103  CO2 26 27  GLUCOSE 159* 224*  BUN 24* 20  CREATININE 0.73 1.00  CALCIUM 9.0 9.2  MG  --  1.5*    Lipid Panel:     Component Value Date/Time   CHOL 134 01/15/2015 0139   TRIG 136 01/15/2015 0139   HDL 35* 01/15/2015 0139   CHOLHDL 3.8 01/15/2015 0139   VLDL 27 01/15/2015 0139   LDLCALC 72 01/15/2015 0139   HgbA1c: No results found for: HGBA1C Urine Drug Screen: No results found for: LABOPIA, COCAINSCRNUR, LABBENZ, AMPHETMU, THCU, LABBARB    IMAGING   [facility] Studies:  MRI HEAD:  Acute 4 x 14 mm RIGHT pontine ischemia.  Involutional changes. Moderate chronic small vessel ischemicdisease.    MRA HEAD:  Generalized decreased signal to noise ratio limitsevaluation of vertebral  artery's without acute large vessel  occlusion or definite high-grade stenosis. If clinically indicated,repeat exam could be performed versus CTA head.    MRA NECK: Moderate bilateral V1 stenosis versus artifact. Noconvincing evidence of high-grade stenosis with aforementioned limitations.          PHYSICAL EXAM Mental Status: pleasant 81 year old female alert and sociable. Patient is awake, alert, oriented to person, place, month, year, and situation. No signs of aphasia or neglect Cranial Nerves: II: Visual Fields are full. Pupils are equal, round, and reactive to light.  III,IV, VI: EOMI without ptosis or diploplia.  V: Facial sensation is symmetric to temperature VII: mild left lower facial droop VIII: hearing is intact to voice X: Uvula elevates symmetrically XI: Shoulder shrug is symmetric. XII: tongue is midline without atrophy or fasciculations.  Motor: Tone is normal. Bulk is normal. Strength - 5/5 right upper and lower extremities. 4/5 left upper and lower extremities. Sensory: Sensation is symmetric to light touch and temperature in the arms and legs. Cerebellar: Finger to nose performed with some difficulty on the left.    ASSESSMENT/PLAN Ms. Valerie Pham is a 81 y.o. female with history of Hypertension, hyperlipidemia, atrial flutter,diabetes, and hypothyroidism presenting in transfer from [facility] in [place] with a pontine infarct. She did not receive IV t-PA due to late presentation.  Stroke:  Non-dominant infarct secondary to small vessel  disease.  Resultant left hemiparesis  MRI  See above  MRA  See above  Carotid Doppler - refer to MRA of the neck  2D Echo - EF 65-70%. No cardiac source of emboli identified.  LDL - 72  HgbA1c pending  VTE prophylaxis - Lovenox Diet heart healthy/carb modified Room service appropriate?: Yes; Fluid consistency:: Thin  No antithrombotic prior to admission, now on aspirin 325 mg  daily  Patient counseled to be compliant with her antithrombotic medications  Ongoing aggressive stroke risk factor management  Therapy recommendations: CIR recommended  Disposition: pending  Hypertension  Blood pressure is mildly high at times  Permissive hypertension (OK if < 220/120) but gradually normalize in 5-7 days  Hyperlipidemia  Home meds:  Lipitor 20 mg daily resumed in hospital - will order  LDL 72, goal < 70  Continue statin at discharge  Diabetes  HgbA1c Pending, goal < 7.0  Unc / Controlled - await hemoglobin A1c results  Other Stroke Risk Factors  Advanced age  Former smoker  Obesity, Body mass index is 34.18 kg/(m^2).   Family hx stroke (mother)  History of atrial flutter - not sure patient would be a candidate for anticoagulation. Will order telemetry. HR 140's last night. Restart Lopressor at 50 mg Bid (was 100 mg Bid at home)   Other Active Problems  Hypokalemia - supplement - recheck in a.m.  Mildly elevated BUN - encourage fluids  Check a urinalysis - patient reports "bladder problems" . Strong smelling urine. 3 day course of Cipro  Hospital day # 2  Delton See PA-C [facility] Pager [phone] 01/16/2015, 9:41 AM  I have personally examined this patient, reviewed notes, independently viewed imaging studies, participated in medical decision making and plan of care. I have made any additions or clarifications directly to the above note. Agree with note above.   Will sign off call with questions     To contact Stroke Continuity provider, please refer to [email or web address]. After hours, contact General Neurology

## 2015-01-16 NOTE — Progress Notes (Signed)
Utilization Review Completed.Alcus Bradly T12/18/2016  

## 2015-01-16 NOTE — Progress Notes (Signed)
Occupational Therapy Evaluation Patient Details Name: Valerie Pham H Tholl MRN: [id number] DOB: 04/07/1927 Today's Date: 01/16/2015    History of Present Illness 81 yo  female from [place] was admitted and has R pontine CVA with L side weakness, slurred speech, difficulty with focus on PT instructions   Clinical Impression   PTA, pt lived alone and was independent with ADL and mobility. Pt presents with significant deficits as listed below and will benefit from rehab at CIR. Pt currently requires mod A with mobility and ADL. Poor insight/awareness of deficits. Supportive family who are interested in CIR, but also want to know if there is a CIR in hospital in [place]. Will follow acutely to address established goals.    Follow Up Recommendations  CIR;Supervision/Assistance - 24 hour    Equipment Recommendations  3 in 1 bedside comode;Tub/shower bench    Recommendations for Other Services Rehab consult     Precautions / Restrictions Precautions Precautions: Fall (telemetry) Precaution Comments:   Restrictions Weight Bearing Restrictions: No      Mobility Bed Mobility Overal bed mobility: Needs Assistance Bed Mobility: Supine to Sit     Supine to sit: Mod assist        Transfers Overall transfer level: Needs assistance Equipment used: 1 person hand held assist   Sit to Stand: Mod assist Stand pivot transfers: Mod assist;         Balance     Sitting balance-Leahy Scale: Fair       Standing balance-Leahy Scale: Poor                              ADL Overall ADL's : Needs assistance/impaired Eating/Feeding: Minimal assistance   Grooming: Moderate assistance   Upper Body Bathing: Moderate assistance   Lower Body Bathing: Moderate assistance   Upper Body Dressing : Moderate assistance   Lower Body Dressing: Moderate assistance Lower Body Dressing Details (indicate cue type and reason): able to lean over to pull up socks BLE Toilet  Transfer: Moderate assistance;Stand-pivot   Toileting- Clothing Manipulation and Hygiene: Maximal assistance Toileting - Clothing Manipulation Details (indicate cue type and reason): incontinent urine     Functional mobility during ADLs: Moderate assistance (+2 for ambulation) General ADL Comments: Poor insight/awareness into deficits. Daughter asking many quesitons regarding rehab process and if her mother will be able to return to PLOF. Educaated daughter on rehab options.     Vision Vision Assessment?: Yes Eye Alignment: Within Functional Limits Ocular Range of Motion: Within Functional Limits Alignment/Gaze Preference: Within Defined Limits Tracking/Visual Pursuits: Decreased smoothness of horizontal tracking Saccades: Additional head turns occurred during testing Convergence: Within functional limits Visual Fields: No apparent deficits Additional Comments: ? decreased L visual attention. Prefers R    Development worker, international aiderception Perception Perception Tested?: No   Praxis Praxis Praxis tested?: Within functional limits    Pertinent Vitals/Pain Pain Assessment: No/denies pain     Hand Dominance Right   Extremity/Trunk Assessment Upper Extremity Assessment Upper Extremity Assessment: LUE deficits/detail LUE Deficits / Details: isolated movement. generalized weakness. Able to complete @ 60 shoulder FF. gross grasp and release. minimal isolated finger movement. unable to complete hand to mouth with L hand. difficulty maintaingin grasp on object . Attempting to use during functional transfers. Poor positional awareness LUE at times.Daughter reposts LUE movement has improved since yesterday. LUE Sensation: decreased light touch LUE Coordination: decreased fine motor;decreased gross motor   Lower Extremity Assessment Lower Extremity Assessment: LLE  deficits/detail LLE Deficits / Details: weakness. isolated movemetn   Cervical / Trunk Assessment Cervical / Trunk Assessment: Normal    Communication Communication Communication: Expressive difficulties;Other (comment) (slurred speech)   Cognition Arousal/Alertness: Awake/alert Behavior During Therapy: Flat affect Overall Cognitive Status: Impaired/Different from baseline Area of Impairment: Attention;Memory;Following commands;Safety/judgement;Awareness;Problem solving;Orientation Orientation Level: Situation;Time;Place (not aware she had a CVA) Current Attention Level: Selective Memory: Decreased short-term memory;Decreased recall of precautions Following Commands: Follows one step commands consistently Safety/Judgement: Decreased awareness of safety;Decreased awareness of deficits Awareness: Emergent Problem Solving: Slow processing;Difficulty sequencing;Requires verbal cues;Requires tactile cues General Comments: mod A with transfer and pt thinks she can walk to bathroom alone   General Comments       Exercises  Other Exercises Other Exercises: encourage pt to use LUE   Shoulder Instructions      Home Living Family/patient expects to be discharged to:: Inpatient rehab Living Arrangements: Alone                                      Prior Functioning/Environment Level of Independence: Independent (had caregivers for 5 hours a day for help of activities )        Comments: has caregiver 2 days/week to assist with IADL tasks. 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    OT Diagnosis: Generalized weakness;Cognitive deficits   OT Problem List: Decreased strength;Decreased range of motion;Decreased activity tolerance;Impaired balance (sitting and/or standing);Decreased cognition;Decreased coordination;Decreased safety awareness;Decreased knowledge of use of DME or AE;Impaired sensation;Obesity;Impaired UE functional use   OT Treatment/Interventions: Self-care/ADL training;Therapeutic exercise;Neuromuscular education;DME and/or AE instruction;Therapeutic activities;Cognitive  remediation/compensation;Patient/family education;Balance training    OT Goals(Current goals can be found in the care plan section) Acute Rehab OT Goals Patient Stated Goal: to return home OT Goal Formulation: With patient Time For Goal Achievement: 01/30/15 Potential to Achieve Goals: Good ADL Goals Pt Will Perform Eating: with set-up;sitting Pt Will Perform Grooming: with set-up;sitting Pt Will Perform Upper Body Bathing: with set-up;with supervision;sitting Pt Will Perform Lower Body Bathing: with min guard assist;sit to/from stand Pt Will Transfer to Toilet: with min guard assist;bedside commode Pt Will Perform Toileting - Clothing Manipulation and hygiene: with supervision;sitting/lateral leans  OT Frequency: Min 2X/week   Barriers to D/C:            Co-evaluation              End of Session Equipment Utilized During Treatment: Gait belt Nurse Communication: Mobility status  Activity Tolerance: Patient tolerated treatment well Patient left: in chair;with call bell/phone within reach;with chair alarm set;with family/visitor present   Time: 1625-1650 OT Time Calculation (min): 25 min Charges:  OT General Charges $OT Visit: 1 Procedure OT Evaluation $Initial OT Evaluation Tier I: 1 Procedure OT Treatments $Self Care/Home Management : 8-22 mins G-Codes:    Jermani Pund,HILLARY Jan 30, 2015, 5:28 PM   [facility], OTR/L  [phone] 2015-01-30

## 2015-01-17 DIAGNOSIS — E039 Hypothyroidism, unspecified: Secondary | ICD-10-CM

## 2015-01-17 DIAGNOSIS — N390 Urinary tract infection, site not specified: Secondary | ICD-10-CM

## 2015-01-17 DIAGNOSIS — I10 Essential (primary) hypertension: Secondary | ICD-10-CM

## 2015-01-17 DIAGNOSIS — M159 Polyosteoarthritis, unspecified: Secondary | ICD-10-CM | POA: Insufficient documentation

## 2015-01-17 DIAGNOSIS — I69359 Hemiplegia and hemiparesis following cerebral infarction affecting unspecified side: Secondary | ICD-10-CM | POA: Insufficient documentation

## 2015-01-17 DIAGNOSIS — I6359 Cerebral infarction due to unspecified occlusion or stenosis of other cerebral artery: Secondary | ICD-10-CM

## 2015-01-17 DIAGNOSIS — E1142 Type 2 diabetes mellitus with diabetic polyneuropathy: Secondary | ICD-10-CM

## 2015-01-17 LAB — MAGNESIUM: MAGNESIUM: 1.6 mg/dL — AB (ref 1.7–2.4)

## 2015-01-17 LAB — GLUCOSE, CAPILLARY
GLUCOSE-CAPILLARY: 289 mg/dL — AB (ref 65–99)
GLUCOSE-CAPILLARY: 166 mg/dL — AB (ref 65–99)
GLUCOSE-CAPILLARY: 159 mg/dL — AB (ref 65–99)
Glucose-Capillary: 154 mg/dL — ABNORMAL HIGH (ref 65–99)

## 2015-01-17 LAB — HEMOGLOBIN A1C
HEMOGLOBIN A1C: 7.6 % — AB (ref 4.8–5.6)
MEAN PLASMA GLUCOSE: 171 mg/dL

## 2015-01-17 LAB — BASIC METABOLIC PANEL
Anion gap: 12 (ref 5–15)
BUN: 29 mg/dL — AB (ref 6–20)
CHLORIDE: 103 mmol/L (ref 101–111)
CO2: 24 mmol/L (ref 22–32)
Calcium: 8.5 mg/dL — ABNORMAL LOW (ref 8.9–10.3)
Creatinine, Ser: 0.84 mg/dL (ref 0.44–1.00)
GFR calc Af Amer: >60 mL/min (ref >60)
GFR calc non Af Amer: >60 mL/min (ref >60)
GLUCOSE: 172 mg/dL — AB (ref 65–99)
POTASSIUM: 3.5 mmol/L (ref 3.5–5.1)
SODIUM: 139 mmol/L (ref 135–145)

## 2015-01-17 LAB — URINE CULTURE

## 2015-01-17 MED ORDER — POTASSIUM CHLORIDE CRYS ER 20 MEQ PO TBCR
40.0000 meq | EXTENDED_RELEASE_TABLET | Freq: Once | ORAL | Status: AC
  Administered 2015-01-17: 40 meq via ORAL
  Filled 2015-01-17: qty 2

## 2015-01-17 MED ORDER — MAGNESIUM SULFATE 2 GM/50ML IV SOLN
2.0000 g | Freq: Once | INTRAVENOUS | Status: AC
  Administered 2015-01-17: 2 g via INTRAVENOUS
  Filled 2015-01-17: qty 50

## 2015-01-17 MED ORDER — APIXABAN 5 MG PO TABS
5.0000 mg | ORAL_TABLET | Freq: Two times a day (BID) | ORAL | Status: DC
  Administered 2015-01-17 – 2015-01-18 (×3): 5 mg via ORAL
  Filled 2015-01-17 (×3): qty 1

## 2015-01-17 NOTE — H&P (Signed)
Physical Medicine and Rehabilitation Admission H&P    Chief complaint: Left-sided weakness   HPI: Valerie Pham is a 81 y.o. right handed female with history of hypertension, atrial flutter maintained on aspirin, hypothyroidism, diabetes mellitus and peripheral neuropathy. By report patient uses a cane and lives alone in [place] had caregivers 5 hours a day for help of activities and ADLs. Presented 01/13/2015 from [facility] with left-sided weakness and slurred speech. Patient did not receive TPA. MRI at outside hospital revealed right pontine CVA. Echocardiogram showed ejection fraction of 62% grade 1 diastolic dysfunction. No cardiac source of emboli. MRA of head and neck without large vessel occlusion or stenosis. Neurology follow-up and placed on Eliquis for CVA prophylaxis . Tolerating a regular consistency diet. Urine study with negative nitrite cultures multiple species initially on Cipro and discontinued. Patient with unwitnessed fall on 01/18/2015 and found on floor with complaints of left index finger pain and no other trauma sustained. X-rays of left hand showed soft tissue swelling about the index finger without fracture. Physical and occupational therapy evaluations completed 01/15/2015 with recommendations of physical medicine rehabilitation consult. Patient was admitted for comprehensive rehabilitation program  ROS Constitutional: Negative for fever and chills.  HENT: Negative for hearing loss.  Eyes: Negative for blurred vision and double vision.  Respiratory: Negative for cough and shortness of breath.  Cardiovascular: Negative for chest pain, palpitations and leg swelling.  Gastrointestinal: Positive for constipation. Negative for nausea and vomiting.  Genitourinary: Positive for urgency. Negative for dysuria and hematuria.  Musculoskeletal: Positive for myalgias, joint pain and falls.  Neurological: Positive for focal weakness. Negative for dizziness, seizures,  loss of consciousness and headaches.  All other systems reviewed and are negative   Past Medical History  Diagnosis Date  . Benign essential HTN   . DM2 (diabetes mellitus, type 2) ([place])   . Hypothyroid   . Glaucoma    Past Surgical History  Procedure Laterality Date  . Abdominal hysterectomy    . Tonsillectomy    . Appendectomy    . Cataract extraction, bilateral     Family History  Problem Relation Age of Onset  . Stroke Mother   . Hypertension Mother   . CAD Father    Social History:  reports that she has never smoked. She does not have any smokeless tobacco history on file. She reports that she does not drink alcohol or use illicit drugs. Allergies:  Allergies  Allergen Reactions  . Penicillins Hives   Medications Prior to Admission  Medication Sig Dispense Refill  . amLODipine (NORVASC) 5 MG tablet Take 5 mg by mouth daily.    Marland Kitchen atorvastatin (LIPITOR) 20 MG tablet Take 20 mg by mouth daily.    . dorzolamide-timolol (COSOPT) 22.3-6.8 MG/ML ophthalmic solution Place 1 drop into both eyes 2 (two) times daily.    Marland Kitchen glipiZIDE (GLUCOTROL) 10 MG tablet Take 10 mg by mouth daily before breakfast.    . levothyroxine (SYNTHROID, LEVOTHROID) 75 MCG tablet Take 75 mcg by mouth daily before breakfast.    . losartan (COZAAR) 100 MG tablet Take 100 mg by mouth daily.    . metFORMIN (GLUCOPHAGE) 500 MG tablet Take 500 mg by mouth 2 (two) times daily with a meal.    . metoprolol (LOPRESSOR) 100 MG tablet Take 100 mg by mouth 2 (two) times daily.      Home: Home Living Family/patient expects to be discharged to:: Inpatient rehab Living Arrangements: Alone   Functional History: Prior  Function Level of Independence: Independent (had caregivers for 5 hours a day for help of activities ) Comments: has caregiver 2 days/week to assist with IADL tasks. [place] bus to [employer] 2 days/wk.  Functional Status:  Mobility: Bed Mobility Overal bed mobility: Needs Assistance Bed  Mobility: Rolling, Sidelying to Sit Rolling: Min assist Sidelying to sit: Min assist Supine to sit: Mod assist General bed mobility comments: Pt was able to complete full roll with min assist at hips, and demonstrated transition to sitting with min assist at the trunk. VC's for sequencing and technique required.  Transfers Overall transfer level: Needs assistance Equipment used: Rolling walker (2 wheeled) Transfers: Sit to/from Stand Sit to Stand: Min assist, +2 physical assistance, From elevated surface Stand pivot transfers: Mod assist, From elevated surface (with gait belt and PT) General transfer comment: +2 assist required as pt powered-up to full standing position. VC's were provided for hand placement on seated surface for safety as well as for appropriate time to reach for the walker for support.  Ambulation/Gait Ambulation/Gait assistance: Min assist, +2 physical assistance, +2 safety/equipment Ambulation Distance (Feet): 15 Feet Assistive device: Rolling walker (2 wheeled) Gait Pattern/deviations: Step-through pattern, Decreased stride length, Decreased weight shift to left General Gait Details: Initially heavy +2 assist required for safe ambulation. Pt was able to improve to mod assist +1 while second person brought the chair up behind pt. Noted external rotation of the L hip, as foot was turned out and pt advancing LLE with hip adductors vs. hip flexors/quads.  Gait velocity: Decreased Gait velocity interpretation: Below normal speed for age/gender    ADL: ADL Overall ADL's : Needs assistance/impaired Eating/Feeding: Minimal assistance Grooming: Moderate assistance Upper Body Bathing: Moderate assistance Lower Body Bathing: Moderate assistance Upper Body Dressing : Moderate assistance Lower Body Dressing: Moderate assistance Lower Body Dressing Details (indicate cue type and reason): able to lean over to pull up socks BLE Toilet Transfer: Moderate assistance,  Stand-pivot Toileting- Clothing Manipulation and Hygiene: Maximal assistance Toileting - Clothing Manipulation Details (indicate cue type and reason): incontinent urine Functional mobility during ADLs: Moderate assistance (+2 for ambulation) General ADL Comments: Poor insight/awareness into deficits. Daughter asking many quesitons regarding rehab process and if her mother will be able to return to PLOF. Educaated daughter on rehab options.  Cognition: Cognition Overall Cognitive Status: Within Functional Limits for tasks assessed Orientation Level: Oriented to person, Oriented to time, Oriented to situation Cognition Arousal/Alertness: Awake/alert Behavior During Therapy: WFL for tasks assessed/performed Overall Cognitive Status: Within Functional Limits for tasks assessed Area of Impairment: Attention, Memory, Following commands, Safety/judgement, Awareness, Problem solving, Orientation Orientation Level: Situation, Time, Place (not aware she had a CVA) Current Attention Level: Selective Memory: Decreased short-term memory, Decreased recall of precautions Following Commands: Follows one step commands consistently Safety/Judgement: Decreased awareness of safety, Decreased awareness of deficits Awareness: Emergent Problem Solving: Slow processing, Difficulty sequencing, Requires verbal cues, Requires tactile cues General Comments: mod A with transfer and pt thinks she can walk to bathroom alone  Physical Exam: Blood pressure 161/68, pulse 69, temperature 98.4 F (36.9 C), temperature source Oral, resp. rate 18, height 5' 7"  (1.702 m), weight 99 kg (218 lb 4.1 oz), SpO2 97 %. Physical Exam Constitutional: She is oriented to person, place, and time. She appears well-developed and well-nourished. Urine with odor HENT:  Head: Normocephalic and atraumatic.  Eyes: Conjunctivae and EOM are normal.  Neck: Normal range of motion. Neck supple. No thyromegaly present.  Cardiovascular: Normal  rate and regular rhythm.  Systolic Murmur heard. Respiratory: Effort normal and breath sounds normal. No respiratory distress.  GI: Soft. Bowel sounds are normal. She exhibits no distension.  Musculoskeletal: She exhibits no edema or tenderness.  Neurological: She is alert and oriented to person, place, and time.  Follows commands.  Fair awareness of deficits Sensation intact to light touch Left hemifacial weakness and tongue deviation, speech dysarthric 3+ DTR LUE and LLE Motor: LUE: Shoulder abduction 3/5, elbow flexion and extension 3+/5, finger grip  3 to 3+/5 and somewhat limited by pain.   LLE: Hip flexion 3+/5, , ke 3+, ankle dorsi/plantar flexion 3+ to 4 -/5 RUE/RLE: 5/5 prox to distal.  Skin: Skin is warm and dry.  Psychiatric: She has a normal mood and affect. Her behavior is normal. Reasonable insight and awareness.   Results for orders placed or performed during the hospital encounter of 01/14/15 (from the past 48 hour(s))  CBC     Status: Abnormal   Collection Time: 01/15/15  4:35 PM  Result Value Ref Range   WBC 12.7 (H) 4.0 - 10.5 K/uL   RBC 4.64 3.87 - 5.11 MIL/uL   Hemoglobin 13.8 12.0 - 15.0 g/dL   HCT 42.0 36.0 - 46.0 %   MCV 90.5 78.0 - 100.0 fL   MCH 29.7 26.0 - 34.0 pg   MCHC 32.9 30.0 - 36.0 g/dL   RDW 14.8 11.5 - 15.5 %   Platelets 239 150 - 400 K/uL  Comprehensive metabolic panel     Status: Abnormal   Collection Time: 01/15/15  4:35 PM  Result Value Ref Range   Sodium 140 135 - 145 mmol/L   Potassium 3.2 (L) 3.5 - 5.1 mmol/L   Chloride 103 101 - 111 mmol/L   CO2 27 22 - 32 mmol/L   Glucose, Bld 224 (H) 65 - 99 mg/dL   BUN 20 6 - 20 mg/dL   Creatinine, Ser 1.00 0.44 - 1.00 mg/dL   Calcium 9.2 8.9 - 10.3 mg/dL   Total Protein 6.9 6.5 - 8.1 g/dL   Albumin 3.6 3.5 - 5.0 g/dL   AST 13 (L) 15 - 41 U/L   ALT 12 (L) 14 - 54 U/L   Alkaline Phosphatase 54 38 - 126 U/L   Total Bilirubin 0.9 0.3 - 1.2 mg/dL   GFR calc non Af Amer 50 (L) >60 mL/min    GFR calc Af Amer 58 (L) >60 mL/min    Comment: (NOTE) The eGFR has been calculated using the CKD EPI equation. This calculation has not been validated in all clinical situations. eGFR's persistently <60 mL/min signify possible Chronic Kidney Disease.    Anion gap 10 5 - 15  Magnesium     Status: Abnormal   Collection Time: 01/15/15  4:35 PM  Result Value Ref Range   Magnesium 1.5 (L) 1.7 - 2.4 mg/dL  Glucose, capillary     Status: Abnormal   Collection Time: 01/15/15  4:36 PM  Result Value Ref Range   Glucose-Capillary 199 (H) 65 - 99 mg/dL  Urinalysis, Routine w reflex microscopic (not at [facility])     Status: Abnormal   Collection Time: 01/15/15  6:51 PM  Result Value Ref Range   Color, Urine YELLOW YELLOW   APPearance CLOUDY (A) CLEAR   Specific Gravity, Urine 1.010 1.005 - 1.030   pH 5.0 5.0 - 8.0   Glucose, UA NEGATIVE NEGATIVE mg/dL   Hgb urine dipstick NEGATIVE NEGATIVE   Bilirubin Urine NEGATIVE NEGATIVE   Ketones, ur NEGATIVE  NEGATIVE mg/dL   Protein, ur NEGATIVE NEGATIVE mg/dL   Nitrite NEGATIVE NEGATIVE   Leukocytes, UA MODERATE (A) NEGATIVE  Urine microscopic-add on     Status: Abnormal   Collection Time: 01/15/15  6:51 PM  Result Value Ref Range   Squamous Epithelial / LPF 0-5 (A) NONE SEEN   WBC, UA TOO NUMEROUS TO COUNT 0 - 5 WBC/hpf   RBC / HPF 0-5 0 - 5 RBC/hpf   Bacteria, UA MANY (A) NONE SEEN  Glucose, capillary     Status: Abnormal   Collection Time: 01/15/15  9:33 PM  Result Value Ref Range   Glucose-Capillary 165 (H) 65 - 99 mg/dL   Comment 1 Notify RN    Comment 2 Document in Chart   Glucose, capillary     Status: Abnormal   Collection Time: 01/16/15  6:31 AM  Result Value Ref Range   Glucose-Capillary 147 (H) 65 - 99 mg/dL   Comment 1 Notify RN    Comment 2 Document in Chart   Glucose, capillary     Status: Abnormal   Collection Time: 01/16/15 11:45 AM  Result Value Ref Range   Glucose-Capillary 173 (H) 65 - 99 mg/dL  Glucose, capillary      Status: Abnormal   Collection Time: 01/16/15  4:25 PM  Result Value Ref Range   Glucose-Capillary 190 (H) 65 - 99 mg/dL  Culture, Urine     Status: None (Preliminary result)   Collection Time: 01/16/15  5:24 PM  Result Value Ref Range   Specimen Description URINE, CLEAN CATCH    Special Requests NONE    Culture TOO YOUNG TO READ    Report Status PENDING   Glucose, capillary     Status: Abnormal   Collection Time: 01/16/15  9:32 PM  Result Value Ref Range   Glucose-Capillary 195 (H) 65 - 99 mg/dL   Comment 1 Notify RN    Comment 2 Document in Chart   Basic metabolic panel     Status: Abnormal   Collection Time: 01/17/15  6:35 AM  Result Value Ref Range   Sodium 139 135 - 145 mmol/L   Potassium 3.5 3.5 - 5.1 mmol/L   Chloride 103 101 - 111 mmol/L   CO2 24 22 - 32 mmol/L   Glucose, Bld 172 (H) 65 - 99 mg/dL   BUN 29 (H) 6 - 20 mg/dL   Creatinine, Ser 0.84 0.44 - 1.00 mg/dL   Calcium 8.5 (L) 8.9 - 10.3 mg/dL   GFR calc non Af Amer >60 >60 mL/min   GFR calc Af Amer >60 >60 mL/min    Comment: (NOTE) The eGFR has been calculated using the CKD EPI equation. This calculation has not been validated in all clinical situations. eGFR's persistently <60 mL/min signify possible Chronic Kidney Disease.    Anion gap 12 5 - 15  Magnesium     Status: Abnormal   Collection Time: 01/17/15  6:35 AM  Result Value Ref Range   Magnesium 1.6 (L) 1.7 - 2.4 mg/dL  Glucose, capillary     Status: Abnormal   Collection Time: 01/17/15  6:38 AM  Result Value Ref Range   Glucose-Capillary 154 (H) 65 - 99 mg/dL   Comment 1 Notify RN    Comment 2 Document in Chart    No results found.     Medical Problem List and Plan: 1.  Left side weakness with gait instability secondary to right pontine infarct felt to be embolic  2.  DVT Prophylaxis/Anticoagulation: Eliquis 3. Pain Management: Tylenol as needed 4. Hypertension. Lopressor 50 mg twice a day. Monitor with increased mobility 5. Neuropsych: This  patient is capable of making decisions on her own behalf. 6. Skin/Wound Care: Routine skin checks 7. Fluids/Electrolytes/Nutrition: Routine I&O with follow-up chemistries 8. Diabetes mellitus with peripheral neuropathy. Hemoglobin A1c 7.6.  Check blood sugars before meals and at bedtime. Patient on Glucotrol 10 mg daily and glucophage 500 mg twice daily. Diabetic teaching 9. Hyperlipidemia. Lipitor 10. Hypothyroidism. Synthroid 11. UTI. Urine culture multi-species. Re-collect for urine culture given ongoing frequency and foul odor. Cipro discontinued.  Post Admission Physician Evaluation: 1. Functional deficits secondary  to right pontine infarct. 2. Patient is admitted to receive collaborative, interdisciplinary care between the physiatrist, rehab nursing staff, and therapy team. 3. Patient's level of medical complexity and substantial therapy needs in context of that medical necessity cannot be provided at a lesser intensity of care such as a SNF. 4. Patient has experienced substantial functional loss from his/her baseline which was documented above under the "Functional History" and "Functional Status" headings.  Judging by the patient's diagnosis, physical exam, and functional history, the patient has potential for functional progress which will result in measurable gains while on inpatient rehab.  These gains will be of substantial and practical use upon discharge  in facilitating mobility and self-care at the household level. 5. Physiatrist will provide 24 hour management of medical needs as well as oversight of the therapy plan/treatment and provide guidance as appropriate regarding the interaction of the two. 6. 24 hour rehab nursing will assist with bladder management, bowel management, safety, skin/wound care, disease management, medication administration, pain management and patient education  and help integrate therapy concepts, techniques,education, etc. 7. PT will assess and treat  for/with: Lower extremity strength, range of motion, stamina, balance, functional mobility, safety, adaptive techniques and equipment, NMR, community reintegration, stroke education.   Goals are: supervision to mod I. 8. OT will assess and treat for/with: ADL's, functional mobility, safety, upper extremity strength, adaptive techniques and equipment, NMR, ego support, family/caregiver education, ego support .   Goals are: mod I to min assist. Therapy may proceed with showering this patient. 9. SLP will assess and treat for/with: communication, swallowing, education.  Goals are: mod I. 10. Case Management and Social Worker will assess and treat for psychological issues and discharge planning. 11. Team conference will be held weekly to assess progress toward goals and to determine barriers to discharge. 12. Patient will receive at least 3 hours of therapy per day at least 5 days per week. 13. ELOS: 15-20 days       14. Prognosis:  excellent     Meredith Staggers, MD, [place] [facility] 01/18/2015   01/17/2015

## 2015-01-17 NOTE — Care Management Important Message (Signed)
Important Message  Patient Details  Name: Valerie Pham MRN: [id number] Date of Birth: [birth year]   Medicare Important Message Given:  Yes    [name] [name] 01/17/2015, 3:32 PM

## 2015-01-17 NOTE — Progress Notes (Signed)
Physical Therapy Treatment Patient Details Name: Valerie Pham MRN: [id number] DOB: 09/26/1927 Today's Date: 01/17/2015    History of Present Illness 81 yo  female from [place] was admitted and has R pontine CVA with L side weakness, slurred speech, difficulty with focus on PT instructions    PT Comments    Pt progressing towards physical therapy goals. Was able to complete 15 feet with RW during gait training this session. Pt continues to require +2 assist for safe mobility during OOB activity. Feel this pt continues to be appropriate for further therapy at the CIR level at d/c.   Son present and had many questions regarding process for transitioning to rehab/what type of rehab the pt will be going to. Answered questions for rehab process and deferred to CSW/CM/CIR Admissions Coordinator for the rest of his concerns. Will continue to follow and progress as able per POC.   Follow Up Recommendations  CIR     Equipment Recommendations  Rolling walker with 5" wheels    Recommendations for Other Services Rehab consult     Precautions / Restrictions Precautions Precautions: Fall Restrictions Weight Bearing Restrictions: No    Mobility  Bed Mobility Overal bed mobility: Needs Assistance Bed Mobility: Rolling;Sidelying to Sit Rolling: Min assist Sidelying to sit: Min assist       General bed mobility comments: Pt was able to complete full roll with min assist at hips, and demonstrated transition to sitting with min assist at the trunk. VC's for sequencing and technique required.   Transfers Overall transfer level: Needs assistance Equipment used: Rolling walker (2 wheeled) Transfers: Sit to/from Stand Sit to Stand: Min assist;+2 physical assistance;From elevated surface         General transfer comment: +2 assist required as pt powered-up to full standing position. VC's were provided for hand placement on seated surface for safety as well as for appropriate time to  reach for the walker for support.   Ambulation/Gait Ambulation/Gait assistance: Min assist;+2 physical assistance;+2 safety/equipment Ambulation Distance (Feet): 15 Feet Assistive device: Rolling walker (2 wheeled) Gait Pattern/deviations: Step-through pattern;Decreased stride length;Decreased weight shift to left Gait velocity: Decreased Gait velocity interpretation: Below normal speed for age/gender General Gait Details: Initially heavy +2 assist required for safe ambulation. Pt was able to improve to mod assist +1 while second person brought the chair up behind pt. Noted external rotation of the L hip, as foot was turned out and pt advancing LLE with hip adductors vs. hip flexors/quads.    Stairs            Wheelchair Mobility    Modified Rankin (Stroke Patients Only) Modified Rankin (Stroke Patients Only) Pre-Morbid Rankin Score: No significant disability Modified Rankin: Severe disability     Balance Overall balance assessment: Needs assistance Sitting-balance support: Feet supported;No upper extremity supported Sitting balance-Leahy Scale: Fair     Standing balance support: No upper extremity supported;During functional activity Standing balance-Leahy Scale: Poor                      Cognition Arousal/Alertness: Awake/alert Behavior During Therapy: WFL for tasks assessed/performed Overall Cognitive Status: Within Functional Limits for tasks assessed                      Exercises      General Comments        Pertinent Vitals/Pain Pain Assessment: No/denies pain    Home Living  Prior Function            PT Goals (current goals can now be found in the care plan section) Acute Rehab PT Goals Patient Stated Goal: to return home PT Goal Formulation: With patient/family Time For Goal Achievement: 01/29/15 Potential to Achieve Goals: Good Progress towards PT goals: Progressing toward goals    Frequency   Min 4X/week    PT Plan Current plan remains appropriate    Co-evaluation             End of Session Equipment Utilized During Treatment: Gait belt Activity Tolerance: Patient tolerated treatment well Patient left: in chair;with call bell/phone within reach;with chair alarm set;with nursing/sitter in room;with family/visitor present     Time: [id number] PT Time Calculation (min) (ACUTE ONLY): 32 min  Charges:  $Gait Training: 23-37 mins                    G Codes:      Conni SlipperKirkman, Ramelo Oetken 01/17/2015, 10:50 AM   Conni SlipperLaura Natanael Saladin, PT, DPT Acute Rehabilitation Services Pager: [phone]

## 2015-01-17 NOTE — Consult Note (Signed)
Physical Medicine and Rehabilitation Consult Reason for Consult: Right CVA Referring Physician: [facility]   HPI: Valerie Pham is a 81 y.o. right handed female with history of hypertension, hypothyroidism, diabetes mellitus and peripheral neuropathy. By report patient uses a cane and lives alone in [place] city had caregivers 5 hours a day for help of activities and ADLs. Presented 01/13/2015 from [facility] with left-sided weakness and slurred speech. Patient did not receive TPA. MRI at outside hospital revealed right pontine CVA. Echocardiogram showed ejection fraction of 70% grade 1 diastolic dysfunction. No cardiac source of emboli. MRA of head and neck without large vessel occlusion or stenosis. Neurology follow-up maintained on aspirin for CVA prophylaxis. Subcutaneous Lovenox for DVT prophylaxis. Tolerating a regular consistency diet. Urine study many bacteria and WBCs too numerous to count placed on empiric Cipro. Physical therapy evaluation completed 01/15/2015 with recommendations of physical medicine rehabilitation consult.  Review of Systems  Constitutional: Negative for fever and chills.  HENT: Negative for hearing loss.   Eyes: Negative for blurred vision and double vision.  Respiratory: Negative for cough and shortness of breath.   Cardiovascular: Negative for chest pain, palpitations and leg swelling.  Gastrointestinal: Positive for constipation. Negative for nausea and vomiting.  Genitourinary: Positive for urgency. Negative for dysuria and hematuria.  Musculoskeletal: Positive for myalgias, joint pain and falls.  Neurological: Positive for focal weakness. Negative for dizziness, seizures, loss of consciousness and headaches.  All other systems reviewed and are negative.  Past Medical History  Diagnosis Date  . Benign essential HTN   . DM2 (diabetes mellitus, type 2) (HCC)   . Hypothyroid   . Glaucoma    Past Surgical History  Procedure Laterality Date  .  Abdominal hysterectomy    . Tonsillectomy    . Appendectomy    . Cataract extraction, bilateral     Family History  Problem Relation Age of Onset  . Stroke Mother   . Hypertension Mother   . CAD Father    Social History:  reports that she has never smoked. She does not have any smokeless tobacco history on file. She reports that she does not drink alcohol or use illicit drugs. Allergies:  Allergies  Allergen Reactions  . Penicillins Hives   Medications Prior to Admission  Medication Sig Dispense Refill  . amLODipine (NORVASC) 5 MG tablet Take 5 mg by mouth daily.    Marland Kitchen atorvastatin (LIPITOR) 20 MG tablet Take 20 mg by mouth daily.    . dorzolamide-timolol (COSOPT) 22.3-6.8 MG/ML ophthalmic solution Place 1 drop into both eyes 2 (two) times daily.    Marland Kitchen glipiZIDE (GLUCOTROL) 10 MG tablet Take 10 mg by mouth daily before breakfast.    . levothyroxine (SYNTHROID, LEVOTHROID) 75 MCG tablet Take 75 mcg by mouth daily before breakfast.    . losartan (COZAAR) 100 MG tablet Take 100 mg by mouth daily.    . metFORMIN (GLUCOPHAGE) 500 MG tablet Take 500 mg by mouth 2 (two) times daily with a meal.    . metoprolol (LOPRESSOR) 100 MG tablet Take 100 mg by mouth 2 (two) times daily.      Home: Home Living Family/patient expects to be discharged to:: Inpatient rehab Living Arrangements: Alone  Functional History: Prior Function Level of Independence: Independent (had caregivers for 5 hours a day for help of activities ) Comments: has caregiver 2 days/week to assist with IADL tasks. 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 Functional Status:  Mobility: Bed Mobility Overal  bed mobility: Needs Assistance Bed Mobility: Supine to Sit Supine to sit: Mod assist General bed mobility comments: min assist to get legs over and cue sequence, mod to lift under trunk Transfers Overall transfer level: Needs assistance Equipment used: 1 person hand held assist Transfers: Sit to/from Stand, Stand Pivot  Transfers Sit to Stand: Mod assist, From elevated surface (to power up from each extremity) Stand pivot transfers: Mod assist, From elevated surface (with gait belt and PT) Ambulation/Gait Ambulation/Gait assistance: Mod assist, Min assist Ambulation Distance (Feet): 3 Feet Assistive device: 1 person hand held assist Gait Pattern/deviations: Step-to pattern, Leaning posteriorly, Ataxic, Shuffle, Wide base of support Gait velocity: reduced, halting Gait velocity interpretation: Below normal speed for age/gender    ADL: ADL Overall ADL's : Needs assistance/impaired Eating/Feeding: Minimal assistance Grooming: Moderate assistance Upper Body Bathing: Moderate assistance Lower Body Bathing: Moderate assistance Upper Body Dressing : Moderate assistance Lower Body Dressing: Moderate assistance Lower Body Dressing Details (indicate cue type and reason): able to lean over to pull up socks BLE Toilet Transfer: Moderate assistance, Stand-pivot Toileting- Clothing Manipulation and Hygiene: Maximal assistance Toileting - Clothing Manipulation Details (indicate cue type and reason): incontinent urine Functional mobility during ADLs: Moderate assistance (+2 for ambulation) General ADL Comments: Poor insight/awareness into deficits. Daughter asking many quesitons regarding rehab process and if her mother will be able to return to PLOF. Educaated daughter on rehab options.  Cognition: Cognition Overall Cognitive Status: Impaired/Different from baseline Orientation Level: Oriented to person, Oriented to time, Oriented to situation Cognition Arousal/Alertness: Awake/alert Behavior During Therapy: Flat affect Overall Cognitive Status: Impaired/Different from baseline Area of Impairment: Attention, Memory, Following commands, Safety/judgement, Awareness, Problem solving, Orientation Orientation Level: Situation, Time, Place (not aware she had a CVA) Current Attention Level: Selective Memory:  Decreased short-term memory, Decreased recall of precautions Following Commands: Follows one step commands consistently Safety/Judgement: Decreased awareness of safety, Decreased awareness of deficits Awareness: Emergent Problem Solving: Slow processing, Difficulty sequencing, Requires verbal cues, Requires tactile cues General Comments: mod A with transfer and pt thinks she can walk to bathroom alone  Blood pressure 171/73, pulse 73, temperature 98.4 F (36.9 C), temperature source Oral, resp. rate 18, height 5\' 7"  (1.702 m), weight 99 kg (218 lb 4.1 oz), SpO2 96 %. Physical Exam  Vitals reviewed. Constitutional: She is oriented to person, place, and time. She appears well-developed and well-nourished.  HENT:  Head: Normocephalic and atraumatic.  Eyes: Conjunctivae and EOM are normal.  Neck: Normal range of motion. Neck supple. No thyromegaly present.  Cardiovascular: Normal rate and regular rhythm.   Murmur heard. Respiratory: Effort normal and breath sounds normal. No respiratory distress.  GI: Soft. Bowel sounds are normal. She exhibits no distension.  Musculoskeletal: She exhibits no edema or tenderness.  Neurological: She is alert and oriented to person, place, and time.  Follows commands.  Fair awareness of deficits Sensation intact to light touch Left hemifacial weakness 3+ DTR LUE Dysarthria Motor: LUE: Shoulder abduction 3/5, elbow flexion and extension 3+/5, finger grip 3+/5 LLE: Hip flexion 3+/5, ankle dorsi/plantar flexion 4 -/5 RUE/RLE: 5/5  Skin: Skin is warm and dry.  Psychiatric: She has a normal mood and affect. Her behavior is normal.    Results for orders placed or performed during the hospital encounter of 01/14/15 (from the past 24 hour(s))  Glucose, capillary     Status: Abnormal   Collection Time: 01/16/15  6:31 AM  Result Value Ref Range   Glucose-Capillary 147 (H) 65 - 99 mg/dL  Comment 1 Notify RN    Comment 2 Document in Chart   Glucose,  capillary     Status: Abnormal   Collection Time: 01/16/15 11:45 AM  Result Value Ref Range   Glucose-Capillary 173 (H) 65 - 99 mg/dL  Glucose, capillary     Status: Abnormal   Collection Time: 01/16/15  4:25 PM  Result Value Ref Range   Glucose-Capillary 190 (H) 65 - 99 mg/dL  Glucose, capillary     Status: Abnormal   Collection Time: 01/16/15  9:32 PM  Result Value Ref Range   Glucose-Capillary 195 (H) 65 - 99 mg/dL   Comment 1 Notify RN    Comment 2 Document in Chart    No results found.  Assessment/Plan: Diagnosis: right pontine CVA Labs independently reviewed.  Records reviewed and summated above. Stroke: Continue secondary stroke prophylaxis and Risk Factor Modification listed below:   Antiplatelet therapy Blood Pressure Management:  Continue current medication with prn's with permisive HTN per primary team Statin Agent Diabetes management Motor recovery: Fluoxetine  1. Does the need for close, 24 hr/day medical supervision in concert with the patient's rehab needs make it unreasonable for this patient to be served in a less intensive setting? Yes  2. Co-Morbidities requiring supervision/potential complications: HTN (monitor and provide prns in accordance with increased physical exertion and pain), hypothyroidism (ventricular mood and energy do not limit therapies, continue meds), diabetes mellitus and peripheral neuropathy  (Monitor in accordance with exercise and adjust meds as necessary), OA (ensure pain does not limit therapies), hypomagnesemia (continue to monitor and replete as necessary), recent UTI (Continue abx) 3. Due to bladder management, safety, disease management, medication administration and patient education, does the patient require 24 hr/day rehab nursing? Yes 4. Does the patient require coordinated care of a physician, rehab nurse, PT (1-2 hrs/day, 5 days/week), OT (1-2 hrs/day, 5 days/week) and SLP (1-2 hrs/day, 5 days/week) to address physical and functional  deficits in the context of the above medical diagnosis(es)? Yes Addressing deficits in the following areas: balance, endurance, locomotion, strength, transferring, bowel/bladder control, bathing, dressing, toileting, speech and psychosocial support 5. Can the patient actively participate in an intensive therapy program of at least 3 hrs of therapy per day at least 5 days per week? Yes 6. The potential for patient to make measurable gains while on inpatient rehab is good 7. Anticipated functional outcomes upon discharge from inpatient rehab are modified independent and supervision  with PT, modified independent with OT, modified independent and supervision with SLP. 8. Estimated rehab length of stay to reach the above functional goals is: 12-14 days. 9. Does the patient have adequate social supports and living environment to accommodate these discharge functional goals? Yes 10. Anticipated D/C setting: Home 11. Anticipated post D/C treatments: HH therapy and Home excercise program 12. Overall Rehab/Functional Prognosis: good  RECOMMENDATIONS: This patient's condition is appropriate for continued rehabilitative care in the following setting: CIR Patient has agreed to participate in recommended program. Yes Note that insurance prior authorization may be required for reimbursement for recommended care.  Comment: Rehab Admissions Coordinator to follow up.  Maryla Morrow, MD 01/17/2015

## 2015-01-17 NOTE — Progress Notes (Signed)
PROGRESS NOTE    Valerie Pham GNF:[id number] DOB: 03/22/1927 DOA: 01/14/2015 PCP: Valerie RichmondAVIS,JAMES W, MD  HPI/Brief narrative  81 year old female with history of HTN, DM 2, hypothyroid, glaucoma [facility] for symptoms of left sided facial droop, slurred speech and left-sided weakness. MRI of brain revealed right pontine CVA. The next day, her symptoms appear to worsen and her physicians were concerned that she may have progression of her CVA and MRI services were not available until Monday and hence on family's request, patient was [facility]. Her symptoms progressively improved. Neurology consulted.   Assessment/Plan:  Stroke: Non-dominant infarct Resultant left hemiparesis and dysarthria Etiology: Embolic secondary to history of atrial flutter MRI  Acute 4 x 14 right pontine ischemia MRA head  No acute large vessel occlusion or definite high-grade stenosis MRA neck  Moderate bilateral V1 stenosis versus artifact. No convincing evidence of high-grade stenosis Carotid Doppler - refer to MRA of the neck 2D Echo - EF 65-70%. No cardiac source of emboli identified. LDL - 72 HgbA1c: 7.6 No antithrombotic prior to admission. Initially on aspirin in the [facility]. Now switched to Eliquis. Ongoing aggressive stroke risk factor management Therapy recommendations: CIR - consulted. Disposition: pending  Essential hypertension -  Uncontrolled. Allow for permissive hypertension. Currently holding metoprolol, amlodipine and Cozaar. Started Metoprolol. Resume others at discharge.  Hyperlipidemia -  LDL 72, goal <70 -  Patient on Lipitor 20 MG daily at home, continue discharge  Type II DM -  Hemoglobin A1c: 7.6. Goal <7. Continue SSI. Fluctuating. Holding oral hypoglycemics. Resume oral meds at discharge and will need to adjust as outpatient for better control.   Hypokalemia/Hypomagnesemia -  follow labs & replace as needed.  H/o Atrial Flutter -  Reviewed care everywhere: Documented atrial flutter and was on aspirin. Discussed with neurology/stroke service (Dr. Pearlean BrownieSethi) on 12/19 were recommended anticoagulation . Discussed extensively with patient's son [name]: Discussed disease process, need for anticoagulation-options, risks and benefits of Coumadin versus NOAC's. As per him, no history of frequent falls or bleeding. They opted for Eliquis.   Hypothyroid -  Continue Synthroid.   Presumed UTI - Neurology has started 3 days of Cipro. Patient had some urinary urgency on admission which has resolved. Currently asymptomatic. Urine culture suggestive of contamination-not helpful.   DVT prophylaxis: Lovenox Code Status:  Full Family Communication:  Discussed extensively with patient's son [name] at bedside on 12/19. Disposition Plan:  Possible DC to CIR on 12/20   Consultants:  Neurology  Rehabilitation M.D.  Procedures:   2-D echo 01/15/15: Study Conclusions  - Left ventricle: The cavity size was normal. Wall thickness was normal. Systolic function was vigorous. The estimated ejection fraction was in the range of 65% to 70%. Wall motion was normal; there were no regional wall motion abnormalities. Doppler parameters are consistent with abnormal left ventricular relaxation (grade 1 diastolic dysfunction). Doppler parameters are consistent with high ventricular filling pressure. - Aortic valve: Valve mobility was mildly restricted. There was very mild stenosis. Peak velocity (S): 219 cm/s. Mean gradient (S): 11 mm Hg. Valve area (VTI): 1.58 cm^2. - Mitral valve: Moderately calcified annulus. - Left atrium: The atrium was mildly dilated.  Impressions:  - No cardiac source of emboli was indentified.  Antibiotics:  None   Subjective:  slurred speech has improved. Left-sided weakness has improved. Left leg is weaker than arm. No urinary symptoms.  Objective: Filed Vitals:   01/17/15 0114 01/17/15  0520 01/17/15 0949 01/17/15  1442  BP: 171/73 163/80 161/68 167/73  Pulse: 73 64 69 68  Temp: 98.4 F (36.9 C) 98.4 F (36.9 C) 98.4 F (36.9 C) 98.5 F (36.9 C)  TempSrc: Oral Oral Oral Oral  Resp: Height:      Weight:      SpO2: 96% 99% 97% 100%    Intake/Output Summary (Last 24 hours) at 01/17/15 1712 Last data filed at 01/17/15 0747  Gross per 24 hour  Intake    240 ml  Output      0 ml  Net    240 ml   Filed Weights   01/14/15 2012  Weight: 99 kg (218 lb 4.1 oz)     Exam:  General exam:  Pleasant elderly female lying comfortably in bed Respiratory system: Clear. No increased work of breathing. Cardiovascular system: S1 & S2 heard, RRR. No JVD, murmurs, gallops, clicks or pedal edema. Telemetry: sinus rhythm with BBB morphology. Had some regular tachycardia in the 140s earlier on in admission but difficult to say if it is sinus tachycardia versus atrial flutter with 2 when one block. Gastrointestinal system: Abdomen is nondistended, soft and nontender. Normal bowel sounds heard. Central nervous system: Alert and oriented.  Dysarthria - significantly improved and almost resolved, facial asymmetry -improved. Extremities:  5 x 5 power in right limbs. Grade 4 x 5 power in left upper extremity and 3 x 5 power in the left lower extremity.   Data Reviewed: Basic Metabolic Panel:  Recent Labs Lab 01/15/15 0139 01/15/15 1635 01/17/15 0635  NA 141 140 139  K 3.1* 3.2* 3.5  CL 106 103 103  CO2 GLUCOSE 159* 224* 172*  BUN 24* 20 29*  CREATININE 0.73 1.00 0.84  CALCIUM 9.0 9.2 8.5*  MG  --  1.5* 1.6*   Liver Function Tests:  Recent Labs Lab 01/15/15 1635  AST 13*  ALT 12*  ALKPHOS 54  BILITOT 0.9  PROT 6.9  ALBUMIN 3.6   No results for input(s): LIPASE, AMYLASE in the last 168 hours. No results for input(s): AMMONIA in the last 168 hours. CBC:  Recent Labs Lab 01/15/15 1635  WBC 12.7*  HGB 13.8  HCT 42.0  MCV 90.5  PLT 239    Cardiac Enzymes: No results for input(s): CKTOTAL, CKMB, CKMBINDEX, TROPONINI in the last 168 hours. BNP (last 3 results) No results for input(s): PROBNP in the last 8760 hours. CBG:  Recent Labs Lab 01/16/15 1145 01/16/15 1625 01/16/15 2132 01/17/15 0638 01/17/15 1140  GLUCAP 173* 190* 195* 154* 289*    Recent Results (from the past 240 hour(s))  Culture, Urine     Status: None   Collection Time: 01/16/15  5:24 PM  Result Value Ref Range Status   Specimen Description URINE, CLEAN CATCH  Final   Special Requests NONE  Final   Culture MULTIPLE SPECIES PRESENT, SUGGEST RECOLLECTION  Final   Report Status 01/17/2015 FINAL  Final           Studies: No results found.      Scheduled Meds: . apixaban  5 mg Oral Q12H  . atorvastatin  20 mg Oral q1800  . ciprofloxacin  250 mg Oral BID  . dorzolamide-timolol  1 drop Both Eyes BID  . insulin aspart  0-5 Units Subcutaneous QHS  . insulin aspart  0-9 Units Subcutaneous TID WC  . levothyroxine  75 mcg Oral QAC breakfast  . metoprolol tartrate  50 mg Oral  BID   Continuous Infusions:   Principal Problem:   CVA (cerebral vascular accident) (HCC) Active Problems:   Essential hypertension   DM type 2 (diabetes mellitus, type 2) (HCC)   Hypothyroidism   CVA (cerebral infarction)   Generalized OA   DM type 2 with diabetic peripheral neuropathy (HCC)   Hypomagnesemia   Urinary tract infection, site not specified   Hemiparesis affecting nondominant side as late effect of stroke (HCC)    Time spent: 30 minutes.    Marcellus Scott, MD, FACP, FHM. [facility] Pager [phone]  If 7PM-7AM, please contact night-coverage [email or web address] Password [email or web address] 01/17/2015, 5:12 PM    LOS: 3 days

## 2015-01-17 NOTE — Progress Notes (Signed)
Rehab admissions - I met with patient, son, daughter and family.  I gave them rehab booklets and discussed inpatient rehab.  Family leaning toward inpatient rehab admission.  I have called insurance carrier and have opened the case requesting acute inpatient rehab admission.  I will follow up after I hear back from insurance carrier.  Call me for questions.  #[phone]

## 2015-01-18 ENCOUNTER — Inpatient Hospital Stay (HOSPITAL_COMMUNITY): Payer: Medicare Other

## 2015-01-18 ENCOUNTER — Inpatient Hospital Stay (HOSPITAL_COMMUNITY)
Admission: RE | Admit: 2015-01-18 | Discharge: 2015-02-03 | DRG: 057 | Disposition: A | Discharge status: Home Health | Payer: Medicare Other | Source: Intra-hospital | Attending: Physical Medicine & Rehabilitation | Admitting: Physical Medicine & Rehabilitation

## 2015-01-18 DIAGNOSIS — E785 Hyperlipidemia, unspecified: Secondary | ICD-10-CM | POA: Diagnosis present

## 2015-01-18 DIAGNOSIS — M19049 Primary osteoarthritis, unspecified hand: Secondary | ICD-10-CM

## 2015-01-18 DIAGNOSIS — E039 Hypothyroidism, unspecified: Secondary | ICD-10-CM | POA: Diagnosis present

## 2015-01-18 DIAGNOSIS — M159 Polyosteoarthritis, unspecified: Secondary | ICD-10-CM | POA: Diagnosis present

## 2015-01-18 DIAGNOSIS — Z823 Family history of stroke: Secondary | ICD-10-CM

## 2015-01-18 DIAGNOSIS — H409 Unspecified glaucoma: Secondary | ICD-10-CM | POA: Diagnosis present

## 2015-01-18 DIAGNOSIS — Z794 Long term (current) use of insulin: Secondary | ICD-10-CM | POA: Diagnosis not present

## 2015-01-18 DIAGNOSIS — Z7984 Long term (current) use of oral hypoglycemic drugs: Secondary | ICD-10-CM

## 2015-01-18 DIAGNOSIS — M10042 Idiopathic gout, left hand: Secondary | ICD-10-CM | POA: Diagnosis not present

## 2015-01-18 DIAGNOSIS — I69359 Hemiplegia and hemiparesis following cerebral infarction affecting unspecified side: Secondary | ICD-10-CM | POA: Diagnosis not present

## 2015-01-18 DIAGNOSIS — Z880 Allergy status to penicillin: Secondary | ICD-10-CM | POA: Diagnosis not present

## 2015-01-18 DIAGNOSIS — N390 Urinary tract infection, site not specified: Secondary | ICD-10-CM | POA: Diagnosis present

## 2015-01-18 DIAGNOSIS — Z79899 Other long term (current) drug therapy: Secondary | ICD-10-CM | POA: Diagnosis not present

## 2015-01-18 DIAGNOSIS — I10 Essential (primary) hypertension: Secondary | ICD-10-CM | POA: Diagnosis present

## 2015-01-18 DIAGNOSIS — Z90710 Acquired absence of both cervix and uterus: Secondary | ICD-10-CM | POA: Diagnosis not present

## 2015-01-18 DIAGNOSIS — I69354 Hemiplegia and hemiparesis following cerebral infarction affecting left non-dominant side: Principal | ICD-10-CM

## 2015-01-18 DIAGNOSIS — I6350 Cerebral infarction due to unspecified occlusion or stenosis of unspecified cerebral artery: Secondary | ICD-10-CM | POA: Diagnosis present

## 2015-01-18 DIAGNOSIS — M109 Gout, unspecified: Secondary | ICD-10-CM | POA: Diagnosis present

## 2015-01-18 DIAGNOSIS — E1142 Type 2 diabetes mellitus with diabetic polyneuropathy: Secondary | ICD-10-CM | POA: Diagnosis present

## 2015-01-18 DIAGNOSIS — K5900 Constipation, unspecified: Secondary | ICD-10-CM | POA: Diagnosis present

## 2015-01-18 DIAGNOSIS — A499 Bacterial infection, unspecified: Secondary | ICD-10-CM | POA: Diagnosis present

## 2015-01-18 LAB — MAGNESIUM: MAGNESIUM: 1.8 mg/dL (ref 1.7–2.4)

## 2015-01-18 LAB — BASIC METABOLIC PANEL
Anion gap: 12 (ref 5–15)
BUN: 25 mg/dL — AB (ref 6–20)
CO2: 22 mmol/L (ref 22–32)
Calcium: 8.7 mg/dL — ABNORMAL LOW (ref 8.9–10.3)
Chloride: 105 mmol/L (ref 101–111)
Creatinine, Ser: 0.88 mg/dL (ref 0.44–1.00)
GFR, EST NON AFRICAN AMERICAN: 58 mL/min — AB (ref >60)
Glucose, Bld: 288 mg/dL — ABNORMAL HIGH (ref 65–99)
POTASSIUM: 3.8 mmol/L (ref 3.5–5.1)
SODIUM: 139 mmol/L (ref 135–145)

## 2015-01-18 LAB — GLUCOSE, CAPILLARY
GLUCOSE-CAPILLARY: 252 mg/dL — AB (ref 65–99)
GLUCOSE-CAPILLARY: 194 mg/dL — AB (ref 65–99)
Glucose-Capillary: 168 mg/dL — ABNORMAL HIGH (ref 65–99)
Glucose-Capillary: 192 mg/dL — ABNORMAL HIGH (ref 65–99)

## 2015-01-18 MED ORDER — ONDANSETRON HCL 4 MG PO TABS: 4.0000 mg | ORAL_TABLET | Freq: Four times a day (QID) | ORAL | Status: DC | PRN

## 2015-01-18 MED ORDER — SENNOSIDES-DOCUSATE SODIUM 8.6-50 MG PO TABS: 1.0000 | ORAL_TABLET | Freq: Every evening | ORAL | Status: DC | PRN

## 2015-01-18 MED ORDER — APIXABAN 5 MG PO TABS: 5.0000 mg | ORAL_TABLET | Freq: Two times a day (BID) | ORAL | Status: DC

## 2015-01-18 MED ORDER — INSULIN ASPART 100 UNIT/ML ~~LOC~~ SOLN: 0.0000 [IU] | Freq: Every day | SUBCUTANEOUS | Status: DC

## 2015-01-18 MED ORDER — ONDANSETRON HCL 4 MG/2ML IJ SOLN: 4.0000 mg | Freq: Four times a day (QID) | INTRAMUSCULAR | Status: DC | PRN

## 2015-01-18 MED ORDER — LEVOTHYROXINE SODIUM 75 MCG PO TABS
75.0000 ug | ORAL_TABLET | Freq: Every day | ORAL | Status: DC
  Administered 2015-01-19 – 2015-02-03 (×16): 75 ug via ORAL
  Filled 2015-01-18 (×16): qty 1

## 2015-01-18 MED ORDER — DORZOLAMIDE HCL-TIMOLOL MAL 2-0.5 % OP SOLN
1.0000 [drp] | Freq: Two times a day (BID) | OPHTHALMIC | Status: DC
Start: 2015-01-18 — End: 2015-02-03
  Administered 2015-01-18 – 2015-02-03 (×32): 1 [drp] via OPHTHALMIC
  Filled 2015-01-18: qty 10

## 2015-01-18 MED ORDER — GLIPIZIDE 5 MG PO TABS
10.0000 mg | ORAL_TABLET | Freq: Every day | ORAL | Status: DC
  Administered 2015-01-19 – 2015-02-03 (×16): 10 mg via ORAL
  Filled 2015-01-18: qty 1
  Filled 2015-01-18: qty 2
  Filled 2015-01-18 (×4): qty 1
  Filled 2015-01-18: qty 2
  Filled 2015-01-18 (×3): qty 1
  Filled 2015-01-18: qty 2
  Filled 2015-01-18: qty 1
  Filled 2015-01-18: qty 2
  Filled 2015-01-18: qty 1
  Filled 2015-01-18: qty 2
  Filled 2015-01-18: qty 1

## 2015-01-18 MED ORDER — CIPROFLOXACIN HCL 250 MG PO TABS: 250.0000 mg | ORAL_TABLET | Freq: Two times a day (BID) | ORAL | Status: DC

## 2015-01-18 MED ORDER — METFORMIN HCL 500 MG PO TABS
500.0000 mg | ORAL_TABLET | Freq: Two times a day (BID) | ORAL | Status: DC
  Administered 2015-01-19 – 2015-02-03 (×31): 500 mg via ORAL
  Filled 2015-01-18 (×31): qty 1

## 2015-01-18 MED ORDER — ATORVASTATIN CALCIUM 20 MG PO TABS
20.0000 mg | ORAL_TABLET | Freq: Every day | ORAL | Status: DC
  Administered 2015-01-19 – 2015-02-02 (×15): 20 mg via ORAL
  Filled 2015-01-18 (×16): qty 1

## 2015-01-18 MED ORDER — ACETAMINOPHEN 325 MG PO TABS: 325.0000 mg | ORAL_TABLET | ORAL | Status: DC | PRN

## 2015-01-18 MED ORDER — SORBITOL 70 % SOLN: 30.0000 mL | Freq: Every day | Status: DC | PRN

## 2015-01-18 MED ORDER — METOPROLOL TARTRATE 50 MG PO TABS
50.0000 mg | ORAL_TABLET | Freq: Two times a day (BID) | ORAL | Status: DC
  Administered 2015-01-18 – 2015-01-28 (×21): 50 mg via ORAL
  Filled 2015-01-18 (×3): qty 1
  Filled 2015-01-18: qty 2
  Filled 2015-01-18 (×17): qty 1

## 2015-01-18 MED ORDER — APIXABAN 5 MG PO TABS
5.0000 mg | ORAL_TABLET | Freq: Two times a day (BID) | ORAL | Status: DC
  Administered 2015-01-19 – 2015-02-03 (×31): 5 mg via ORAL
  Filled 2015-01-18 (×31): qty 1

## 2015-01-18 NOTE — Progress Notes (Signed)
Physical Therapy Treatment Patient Details Name: Valerie Pham MRN: [id number] DOB: January 08, 1928 Today's Date: 01/18/2015    History of Present Illness 81 yo  female from [place] was admitted and has R pontine CVA with L side weakness, slurred speech, difficulty with focus on PT instructions    PT Comments    Pt progressing towards physical therapy goals. Pt appears more confused today and slightly impulsive with initiating mobility. Was able to improve ambulation distance this session with +2 assist for balance support and safety. Chair follow utilized. Continue to feel that this patient is appropriate for follow up therapy at the CIR level at d/c.   Follow Up Recommendations  CIR     Equipment Recommendations  Rolling walker with 5" wheels    Recommendations for Other Services       Precautions / Restrictions Precautions Precautions: Fall Restrictions Weight Bearing Restrictions: No    Mobility  Bed Mobility Overal bed mobility: Needs Assistance Bed Mobility: Supine to Sit     Supine to sit: Mod assist     General bed mobility comments: Assist to elevate trunk to full sitting position and to scoot around to the EOB. Bed pad used for assist.   Transfers Overall transfer level: Needs assistance Equipment used: Rolling walker (2 wheeled) Transfers: Sit to/from Stand Sit to Stand: Min assist;+2 physical assistance;From elevated surface Stand pivot transfers: Min assist;+2 physical assistance       General transfer comment: +2 for power-up and safety with [facility]. VC's for hand placement on seated surface for safety.   Ambulation/Gait Ambulation/Gait assistance: Mod assist Ambulation Distance (Feet): 35 Feet Assistive device: Rolling walker (2 wheeled) Gait Pattern/deviations: Step-through pattern;Decreased stride length;Decreased weight shift to left;Decreased step length - left Gait velocity: Decreased Gait velocity interpretation: Below normal  speed for age/gender General Gait Details: VC's for improved posture, looking up, and increased step length on the L. Pt fatigued quickly but was able to sequence better with the RW today.    Stairs            Wheelchair Mobility    Modified Rankin (Stroke Patients Only) Modified Rankin (Stroke Patients Only) Pre-Morbid Rankin Score: No significant disability Modified Rankin: Severe disability     Balance Overall balance assessment: Needs assistance Sitting-balance support: Feet supported;No upper extremity supported Sitting balance-Leahy Scale: Fair     Standing balance support: Bilateral upper extremity supported;During functional activity Standing balance-Leahy Scale: Poor                      Cognition Arousal/Alertness: Awake/alert Behavior During Therapy: WFL for tasks assessed/performed Overall Cognitive Status: Impaired/Different from baseline Area of Impairment: Orientation;Memory;Following commands;Safety/judgement;Problem solving   Current Attention Level: Selective Memory: Decreased short-term memory;Decreased recall of precautions Following Commands: Follows one step commands consistently Safety/Judgement: Decreased awareness of safety;Decreased awareness of deficits Awareness: Emergent Problem Solving: Slow processing;Difficulty sequencing;Requires verbal cues;Requires tactile cues      Exercises Other Exercises Other Exercises: x10 - hit target with L hand different positions    General Comments        Pertinent Vitals/Pain Pain Assessment: No/denies pain    Home Living                      Prior Function            PT Goals (current goals can now be found in the care plan section) Acute Rehab PT Goals Patient Stated Goal: to return  home PT Goal Formulation: With patient/family Time For Goal Achievement: 01/29/15 Potential to Achieve Goals: Good Progress towards PT goals: Progressing toward goals    Frequency  Min  4X/week    PT Plan Current plan remains appropriate    Co-evaluation             End of Session Equipment Utilized During Treatment: Gait belt Activity Tolerance: Patient limited by fatigue Patient left: in chair;with call bell/phone within reach;with chair alarm set;with family/visitor present     Time: 8295-6213 PT Time Calculation (min) (ACUTE ONLY): 24 min  Charges:  $Gait Training: 23-37 mins                    G Codes:      Conni Slipper 2015/02/06, 12:09 PM   Conni Slipper, PT, DPT Acute Rehabilitation Services Pager: [phone]

## 2015-01-18 NOTE — Discharge Summary (Addendum)
Physician Discharge Summary  Valerie Pham ZOX:[id number] DOB: 07/19/1927 DOA: 01/14/2015  PCP: Valerie Richmond, MD  Admit date: 01/14/2015 Discharge date: 01/18/2015  Time spent: Greater than 30 minutes  Recommendations for Outpatient Follow-up:  1. Dr. Lois Pham, PCP: Upon discharge from CIR. 2. Recommend outpatient urology consultation for evaluation and Pham of chronic urinary incontinence 3. Dr. Delia Pham, Neurology  in 2 months   Discharge Diagnoses:  Principal Problem:   CVA (cerebral vascular accident) Valerie Pham) Active Problems:   Essential hypertension   DM type 2 (diabetes mellitus, type 2) (HCC)   Hypothyroidism   CVA (cerebral infarction)   Generalized OA   DM type 2 with diabetic peripheral neuropathy (HCC)   Hypomagnesemia   Urinary tract infection, site not specified   Hemiparesis affecting nondominant side as late effect of stroke [facility])   Discharge Condition: Improved & Stable  Diet recommendation: Heart healthy and diabetic diet.  Filed Weights   01/14/15 2012  Weight: 99 kg (218 lb 4.1 oz)    History of present illness:  81 year old female with history of HTN, DM 2, hypothyroid, glaucoma was admitted to [facility]am on 01/13/15 for symptoms of left sided facial droop, slurred speech and left-sided weakness. MRI of brain revealed right pontine CVA. The next day, her symptoms appear to worsen and her physicians were concerned that she may have progression of her CVA and MRI services were not available until Monday and hence on family's request, patient was transferred to Valerie Pham. Her symptoms progressively improved. Neurology consulted.   Hospital Course:   Stroke: Non-dominant infarct Resultant left hemiparesis and dysarthria Etiology: Embolic secondary to history of atrial flutter MRI Acute 4 x 14 right pontine ischemia MRA head No acute large vessel occlusion or definite high-grade stenosis MRA neck Moderate bilateral V1 stenosis versus  artifact. No convincing evidence of high-grade stenosis Carotid Doppler - refer to MRA of the neck 2D Echo - EF 65-70%. No cardiac source of emboli identified. LDL - 72 HgbA1c: 7.6 No antithrombotic prior to admission. Initially on aspirin in the hospital. Now switched to Eliquis. Ongoing aggressive stroke risk factor Pham Therapy recommendations: CIR Disposition: Discharging to inpatient rehabilitation.  Essential hypertension - Uncontrolled. Allowed for permissive hypertension. Resumed home antihypertensives at discharge-amlodipine, metoprolol and losartan   Hyperlipidemia - LDL 72, goal <70 - Patient on Lipitor 20 MG daily at home, continue at discharge  Type II DM - Hemoglobin A1c: 7.6. Goal <7. Treated with SSI in the hospital. Resumed oral meds at discharge and will need to adjust as outpatient for better control.   Hypokalemia/Hypomagnesemia - replaced  H/o Atrial Flutter - Reviewed care everywhere: Documented atrial flutter and was on aspirin. Discussed with neurology/stroke service (Dr. Pearlean Brownie) on 12/19 were recommended anticoagulation . Discussed extensively with patient's son [name]: Discussed disease process, need for anticoagulation-options, risks and benefits of AC, risks vs benefits of Coumadin versus NOAC's. As per him, no history of frequent falls or bleeding. They opted for Eliquis.  Hypothyroid - Continue Synthroid.  Presumed UTI - Neurology has started 3 days of Cipro. Patient had some urinary urgency on admission which has resolved. Currently asymptomatic. Urine culture suggestive of contamination-not helpful. completes Cipro on 12/21 AM.   Urinary incontinence, chronic  -  as per son, patient has had this issue for sometime now. Recommend outpatient urology consultation. Treated for UTI in the hospital.   Left index finger, pain/redness & swelling  -  as per RN note, had this prior to fall last  night. Patient does not complain of significant  pain. X-rays negative for fracture. Overtly not suspicious for infectious etiology.? Arthritis flare. Treat symptomatically. Follow closely at Val[facility] Pham and consider further evaluation and Pham as deemed necessary.  Status post fall, 12/19 night - No overt injuries per patient.    Family Communication: Discussed extensively with patient's son [name] at bedside on 12/20.    Consultants:  Neurology  Rehabilitation M.D.  Procedures:  2-D echo 01/15/15: Study Conclusions  - Left ventricle: The cavity size was normal. Wall thickness was normal. Systolic function was vigorous. The estimated ejection fraction was in the range of 65% to 70%. Wall motion was normal; there were no regional wall motion abnormalities. Doppler parameters are consistent with abnormal left ventricular relaxation (grade 1 diastolic dysfunction). Doppler parameters are consistent with high ventricular filling pressure. - Aortic valve: Valve mobility was mildly restricted. There was very mild stenosis. Peak velocity (S): 219 cm/s. Mean gradient (S): 11 mm Hg. Valve area (VTI): 1.58 cm^2. - Mitral valve: Moderately calcified annulus. - Left atrium: The atrium was mildly dilated.  Impressions:  - No cardiac source of emboli was indentified.  Antibiotics:  Cipro   Discharge Exam:  Complaints:  Denies complaints. Still having slurred speech and left-sided weakness. Status post fall last night-denies injuries. Left index finger mildly swollen and painful.   Filed Vitals:   01/17/15 2147 01/18/15 0123 01/18/15 0516 01/18/15 0924  BP: 186/84 174/91 126/73 138/88  Pulse: 69 72 77 81  Temp: 98.3 F (36.8 C) 98.3 F (36.8 C) 98.3 F (36.8 C) 98.2 F (36.8 C)  TempSrc: Oral Oral Oral Oral  Resp: 18 18 18 20   Height:      Weight:      SpO2: 94% 96% 98% 99%    General exam: Pleasant elderly female lying comfortably in bed Respiratory system: Clear. No increased work of  breathing. Cardiovascular system: S1 & S2 heard, RRR. No JVD, murmurs, gallops, clicks or pedal edema. Not on telemetry. Gastrointestinal system: Abdomen is nondistended, soft and nontender. Normal bowel sounds heard. Central nervous system: Alert and oriented. Dysarthria & facial asymmetry: Seem to be fluctuating. Extremities: 5 x 5 power in right limbs. Grade 4 x 5 power in left upper extremity and 3 x 5 power in the left lower extremity. Left index finger: Mild swelling, redness and tenderness at the proximal phalanx and ? MTP joint. Able to move finger reasonably without significant pain.   Discharge Instructions      Discharge Instructions    Ambulatory referral to Neurology    Complete by:  As directed   An appointment is requested in approximately: 8 weeks     Call MD for:  difficulty breathing, headache or visual disturbances    Complete by:  As directed      Call MD for:  extreme fatigue    Complete by:  As directed      Call MD for:  persistant dizziness or light-headedness    Complete by:  As directed      Call MD for:  persistant nausea and vomiting    Complete by:  As directed      Call MD for:  redness, tenderness, or signs of infection (pain, swelling, redness, odor or green/yellow discharge around incision site)    Complete by:  As directed      Call MD for:  severe uncontrolled pain    Complete by:  As directed      Call MD for:  temperature >100.4    Complete by:  As directed      Call MD for:    Complete by:  As directed   New strokelike symptoms.  New strokelike symptoms.     Diet - low sodium heart healthy    Complete by:  As directed      Diet Carb Modified    Complete by:  As directed      Increase activity slowly    Complete by:  As directed             Medication List    TAKE these medications        amLODipine 5 MG tablet  Commonly known as:  NORVASC  Take 5 mg by mouth daily.     apixaban 5 MG Tabs tablet  Commonly known as:  ELIQUIS   Take 1 tablet (5 mg total) by mouth 2 (two) times daily.     atorvastatin 20 MG tablet  Commonly known as:  LIPITOR  Take 20 mg by mouth daily.     ciprofloxacin 250 MG tablet  Commonly known as:  CIPRO  Take 1 tablet (250 mg total) by mouth 2 (two) times daily. Discontinue after 01/19/2015 AM dose.     dorzolamide-timolol 22.3-6.8 MG/ML ophthalmic solution  Commonly known as:  COSOPT  Place 1 drop into both eyes 2 (two) times daily.     glipiZIDE 10 MG tablet  Commonly known as:  GLUCOTROL  Take 10 mg by mouth daily before breakfast.     levothyroxine 75 MCG tablet  Commonly known as:  SYNTHROID, LEVOTHROID  Take 75 mcg by mouth daily before breakfast.     losartan 100 MG tablet  Commonly known as:  COZAAR  Take 100 mg by mouth daily.     metFORMIN 500 MG tablet  Commonly known as:  GLUCOPHAGE  Take 500 mg by mouth 2 (two) times daily with a meal.     metoprolol 100 MG tablet  Commonly known as:  LOPRESSOR  Take 100 mg by mouth 2 (two) times daily.       Follow-up Information    Schedule an appointment as soon as possible for a visit with Valerie Richmond, MD.   Specialty:  Family Medicine   Why:  To be seen upon discharge from SNF. Recommend outpatient urology consultation for evaluation of chronic urinary incontinence.   Contact information:   [address] [facility] [address] [place] [address] [phone]       Follow up with SETHI,PRAMOD, MD. Schedule an appointment as soon as possible for a visit in 2 months.   Specialties:  Neurology, Radiology   Contact information:   [address] [place] [address] [phone]        The results of significant diagnostics from this hospitalization (including imaging, microbiology, ancillary and laboratory) are listed below for reference.    Significant Diagnostic Studies: Dg Hand Complete Left  01/18/2015  CLINICAL DATA:  Patient with redness and swelling to the left index  and left little fingers. EXAM: LEFT HAND - COMPLETE 3+ VIEW COMPARISON:  None. FINDINGS: Degenerative changes of the DIP joints as well as the first [facility] joint. Osseous demineralization. No evidence for acute fracture. Soft tissue swelling about the index finger. No underlying osseous erosion. IMPRESSION: Soft tissue swelling about the index finger concerning for acute infectious process. Degenerative changes as above. No acute osseous abnormality. Electronically Signed   By: Annia Belt  M.D.   On: 01/18/2015 12:27    Microbiology: Recent Results (from the past 240 hour(s))  Culture, Urine     Status: None   Collection Time: 01/16/15  5:24 PM  Result Value Ref Range Status   Specimen Description URINE, CLEAN CATCH  Final   Special Requests NONE  Final   Culture MULTIPLE SPECIES PRESENT, SUGGEST RECOLLECTION  Final   Report Status 01/17/2015 FINAL  Final     Labs: Basic Metabolic Panel:  Recent Labs Lab 01/15/15 0139 01/15/15 1635 01/17/15 0635 01/18/15 0540  NA 141 140 139 139  K 3.1* 3.2* 3.5 3.8  CL 106 103 103 105  CO2 26 27 24 22   GLUCOSE 159* 224* 172* 288*  BUN 24* 20 29* 25*  CREATININE 0.73 1.00 0.84 0.88  CALCIUM 9.0 9.2 8.5* 8.7*  MG  --  1.5* 1.6* 1.8   Liver Function Tests:  Recent Labs Lab 01/15/15 1635  AST 13*  ALT 12*  ALKPHOS 54  BILITOT 0.9  PROT 6.9  ALBUMIN 3.6   No results for input(s): LIPASE, AMYLASE in the last 168 hours. No results for input(s): AMMONIA in the last 168 hours. CBC:  Recent Labs Lab 01/15/15 1635  WBC 12.7*  HGB 13.8  HCT 42.0  MCV 90.5  PLT 239   Cardiac Enzymes: No results for input(s): CKTOTAL, CKMB, CKMBINDEX, TROPONINI in the last 168 hours. BNP: BNP (last 3 results) No results for input(s): BNP in the last 8760 hours.  ProBNP (last 3 results) No results for input(s): PROBNP in the last 8760 hours.  CBG:  Recent Labs Lab 01/17/15 1140 01/17/15 1704 01/17/15 2146 01/18/15 0634 01/18/15 1126  GLUCAP  289* 166* 159* 252* 168*        Signed:  Marcellus ScottHONGALGI,Halley Shepheard, MD, FACP, FHM. [facility] Pager [phone]  If 7PM-7AM, please contact night-coverage [email or web address] Password [email or web address] 01/18/2015, 1:07 PM

## 2015-01-18 NOTE — Progress Notes (Signed)
ANTICOAGULATION CONSULT NOTE - Initial Consult  Pharmacy Consult for Eliquis Indication: atrial fibrillation / CVA  Allergies  Allergen Reactions  . Penicillins Hives    Patient Measurements: Height: 5\' 7"  (170.2 cm) Weight: 218 lb 4.1 oz (99 kg) IBW/kg (Calculated) : 61.6  Vital Signs: Temp: 98.2 F (36.8 C) (12/20 0924) Temp Source: Oral (12/20 0924) BP: 138/88 mmHg (12/20 0924) Pulse Rate: 81 (12/20 0924)  Labs:  Recent Labs  01/15/15 1635 01/17/15 0635 01/18/15 0540  HGB 13.8  --   --   HCT 42.0  --   --   PLT 239  --   --   CREATININE 1.00 0.84 0.88    Estimated Creatinine Clearance: 56.5 mL/min (by C-G formula based on Cr of 0.88).   Medical History: Past Medical History  Diagnosis Date  . Benign essential HTN   . DM2 (diabetes mellitus, type 2) (HCC)   . Hypothyroid   . Glaucoma     Assessment: 81 year old female started on Eliquis 12/19 for Aflutter/CVA Scr < 1.5, age > 80, Weight > 60 kg, qualifies for 5 mg po BID dosing  Goal of Therapy:  Appropriate dosing   Plan:  Eliquis 5 mg po BID Pharmacy to sign off and follow peripherally  Thank you Okey RegalLisa Valeria Boza, PharmD [phone]  01/18/2015,9:59 AM

## 2015-01-18 NOTE — H&P (View-Only) (Signed)
Physical Medicine and Rehabilitation Admission H&P    Chief complaint: Left-sided weakness   HPI: Valerie Pham is a 81 y.o. right handed female with history of hypertension, atrial flutter maintained on aspirin, hypothyroidism, diabetes mellitus and peripheral neuropathy. By report patient uses a cane and lives alone in [place] had caregivers 5 hours a day for help of activities and ADLs. Presented 01/13/2015 from [facility] with left-sided weakness and slurred speech. Patient did not receive TPA. MRI at outside hospital revealed right pontine CVA. Echocardiogram showed ejection fraction of 93% grade 1 diastolic dysfunction. No cardiac source of emboli. MRA of head and neck without large vessel occlusion or stenosis. Neurology follow-up and placed on Eliquis for CVA prophylaxis . Tolerating a regular consistency diet. Urine study with negative nitrite cultures multiple species initially on Cipro and discontinued. Patient with unwitnessed fall on 01/18/2015 and found on floor with complaints of left index finger pain and no other trauma sustained. X-rays of left hand showed soft tissue swelling about the index finger without fracture. Physical and occupational therapy evaluations completed 01/15/2015 with recommendations of physical medicine rehabilitation consult. Patient was admitted for comprehensive rehabilitation program  ROS Constitutional: Negative for fever and chills.  HENT: Negative for hearing loss.  Eyes: Negative for blurred vision and double vision.  Respiratory: Negative for cough and shortness of breath.  Cardiovascular: Negative for chest pain, palpitations and leg swelling.  Gastrointestinal: Positive for constipation. Negative for nausea and vomiting.  Genitourinary: Positive for urgency. Negative for dysuria and hematuria.  Musculoskeletal: Positive for myalgias, joint pain and falls.  Neurological: Positive for focal weakness. Negative for dizziness, seizures,  loss of consciousness and headaches.  All other systems reviewed and are negative   Past Medical History  Diagnosis Date  . Benign essential HTN   . DM2 (diabetes mellitus, type 2) ([place])   . Hypothyroid   . Glaucoma    Past Surgical History  Procedure Laterality Date  . Abdominal hysterectomy    . Tonsillectomy    . Appendectomy    . Cataract extraction, bilateral     Family History  Problem Relation Age of Onset  . Stroke Mother   . Hypertension Mother   . CAD Father    Social History:  reports that she has never smoked. She does not have any smokeless tobacco history on file. She reports that she does not drink alcohol or use illicit drugs. Allergies:  Allergies  Allergen Reactions  . Penicillins Hives   Medications Prior to Admission  Medication Sig Dispense Refill  . amLODipine (NORVASC) 5 MG tablet Take 5 mg by mouth daily.    Marland Kitchen atorvastatin (LIPITOR) 20 MG tablet Take 20 mg by mouth daily.    . dorzolamide-timolol (COSOPT) 22.3-6.8 MG/ML ophthalmic solution Place 1 drop into both eyes 2 (two) times daily.    Marland Kitchen glipiZIDE (GLUCOTROL) 10 MG tablet Take 10 mg by mouth daily before breakfast.    . levothyroxine (SYNTHROID, LEVOTHROID) 75 MCG tablet Take 75 mcg by mouth daily before breakfast.    . losartan (COZAAR) 100 MG tablet Take 100 mg by mouth daily.    . metFORMIN (GLUCOPHAGE) 500 MG tablet Take 500 mg by mouth 2 (two) times daily with a meal.    . metoprolol (LOPRESSOR) 100 MG tablet Take 100 mg by mouth 2 (two) times daily.      Home: Home Living Family/patient expects to be discharged to:: Inpatient rehab Living Arrangements: Alone   Functional History: Prior  Function Level of Independence: Independent (had caregivers for 5 hours a day for help of activities ) Comments: has caregiver 2 days/week to assist with IADL tasks. [place] to [facility] 2 days/wk.  Functional Status:  Mobility: Bed Mobility Overal bed mobility: Needs Assistance Bed  Mobility: Rolling, Sidelying to Sit Rolling: Min assist Sidelying to sit: Min assist Supine to sit: Mod assist General bed mobility comments: Pt was able to complete full roll with min assist at hips, and demonstrated transition to sitting with min assist at the trunk. VC's for sequencing and technique required.  Transfers Overall transfer level: Needs assistance Equipment used: Rolling walker (2 wheeled) Transfers: Sit to/from Stand Sit to Stand: Min assist, +2 physical assistance, From elevated surface Stand pivot transfers: Mod assist, From elevated surface (with gait belt and PT) General transfer comment: +2 assist required as pt powered-up to full standing position. VC's were provided for hand placement on seated surface for safety as well as for appropriate time to reach for the walker for support.  Ambulation/Gait Ambulation/Gait assistance: Min assist, +2 physical assistance, +2 safety/equipment Ambulation Distance (Feet): 15 Feet Assistive device: Rolling walker (2 wheeled) Gait Pattern/deviations: Step-through pattern, Decreased stride length, Decreased weight shift to left General Gait Details: Initially heavy +2 assist required for safe ambulation. Pt was able to improve to mod assist +1 while second person brought the chair up behind pt. Noted external rotation of the L hip, as foot was turned out and pt advancing LLE with hip adductors vs. hip flexors/quads.  Gait velocity: Decreased Gait velocity interpretation: Below normal speed for age/gender    ADL: ADL Overall ADL's : Needs assistance/impaired Eating/Feeding: Minimal assistance Grooming: Moderate assistance Upper Body Bathing: Moderate assistance Lower Body Bathing: Moderate assistance Upper Body Dressing : Moderate assistance Lower Body Dressing: Moderate assistance Lower Body Dressing Details (indicate cue type and reason): able to lean over to pull up socks BLE Toilet Transfer: Moderate assistance,  Stand-pivot Toileting- Clothing Manipulation and Hygiene: Maximal assistance Toileting - Clothing Manipulation Details (indicate cue type and reason): incontinent urine Functional mobility during ADLs: Moderate assistance (+2 for ambulation) General ADL Comments: Poor insight/awareness into deficits. Daughter asking many quesitons regarding rehab process and if her mother will be able to return to PLOF. Educaated daughter on rehab options.  Cognition: Cognition Overall Cognitive Status: Within Functional Limits for tasks assessed Orientation Level: Oriented to person, Oriented to time, Oriented to situation Cognition Arousal/Alertness: Awake/alert Behavior During Therapy: WFL for tasks assessed/performed Overall Cognitive Status: Within Functional Limits for tasks assessed Area of Impairment: Attention, Memory, Following commands, Safety/judgement, Awareness, Problem solving, Orientation Orientation Level: Situation, Time, Place (not aware she had a CVA) Current Attention Level: Selective Memory: Decreased short-term memory, Decreased recall of precautions Following Commands: Follows one step commands consistently Safety/Judgement: Decreased awareness of safety, Decreased awareness of deficits Awareness: Emergent Problem Solving: Slow processing, Difficulty sequencing, Requires verbal cues, Requires tactile cues General Comments: mod A with transfer and pt thinks she can walk to bathroom alone  Physical Exam: Blood pressure 161/68, pulse 69, temperature 98.4 F (36.9 C), temperature source Oral, resp. rate 18, height 5' 7"  (1.702 m), weight 99 kg (218 lb 4.1 oz), SpO2 97 %. Physical Exam Constitutional: She is oriented to person, place, and time. She appears well-developed and well-nourished. Urine with odor HENT:  Head: Normocephalic and atraumatic.  Eyes: Conjunctivae and EOM are normal.  Neck: Normal range of motion. Neck supple. No thyromegaly present.  Cardiovascular: Normal  rate and regular rhythm.  Systolic Murmur heard. Respiratory: Effort normal and breath sounds normal. No respiratory distress.  GI: Soft. Bowel sounds are normal. She exhibits no distension.  Musculoskeletal: She exhibits no edema or tenderness.  Neurological: She is alert and oriented to person, place, and time.  Follows commands.  Fair awareness of deficits Sensation intact to light touch Left hemifacial weakness and tongue deviation, speech dysarthric 3+ DTR LUE and LLE Motor: LUE: Shoulder abduction 3/5, elbow flexion and extension 3+/5, finger grip  3 to 3+/5 and somewhat limited by pain.   LLE: Hip flexion 3+/5, , ke 3+, ankle dorsi/plantar flexion 3+ to 4 -/5 RUE/RLE: 5/5 prox to distal.  Skin: Skin is warm and dry.  Psychiatric: She has a normal mood and affect. Her behavior is normal. Reasonable insight and awareness.   Results for orders placed or performed during the hospital encounter of 01/14/15 (from the past 48 hour(s))  CBC     Status: Abnormal   Collection Time: 01/15/15  4:35 PM  Result Value Ref Range   WBC 12.7 (H) 4.0 - 10.5 K/uL   RBC 4.64 3.87 - 5.11 MIL/uL   Hemoglobin 13.8 12.0 - 15.0 g/dL   HCT 42.0 36.0 - 46.0 %   MCV 90.5 78.0 - 100.0 fL   MCH 29.7 26.0 - 34.0 pg   MCHC 32.9 30.0 - 36.0 g/dL   RDW 14.8 11.5 - 15.5 %   Platelets 239 150 - 400 K/uL  Comprehensive metabolic panel     Status: Abnormal   Collection Time: 01/15/15  4:35 PM  Result Value Ref Range   Sodium 140 135 - 145 mmol/L   Potassium 3.2 (L) 3.5 - 5.1 mmol/L   Chloride 103 101 - 111 mmol/L   CO2 27 22 - 32 mmol/L   Glucose, Bld 224 (H) 65 - 99 mg/dL   BUN 20 6 - 20 mg/dL   Creatinine, Ser 1.00 0.44 - 1.00 mg/dL   Calcium 9.2 8.9 - 10.3 mg/dL   Total Protein 6.9 6.5 - 8.1 g/dL   Albumin 3.6 3.5 - 5.0 g/dL   AST 13 (L) 15 - 41 U/L   ALT 12 (L) 14 - 54 U/L   Alkaline Phosphatase 54 38 - 126 U/L   Total Bilirubin 0.9 0.3 - 1.2 mg/dL   GFR calc non Af Amer 50 (L) >60 mL/min    GFR calc Af Amer 58 (L) >60 mL/min    Comment: (NOTE) The eGFR has been calculated using the CKD EPI equation. This calculation has not been validated in all clinical situations. eGFR's persistently <60 mL/min signify possible Chronic Kidney Disease.    Anion gap 10 5 - 15  Magnesium     Status: Abnormal   Collection Time: 01/15/15  4:35 PM  Result Value Ref Range   Magnesium 1.5 (L) 1.7 - 2.4 mg/dL  Glucose, capillary     Status: Abnormal   Collection Time: 01/15/15  4:36 PM  Result Value Ref Range   Glucose-Capillary 199 (H) 65 - 99 mg/dL  Urinalysis, Routine w reflex microscopic (not at [facility])     Status: Abnormal   Collection Time: 01/15/15  6:51 PM  Result Value Ref Range   Color, Urine YELLOW YELLOW   APPearance CLOUDY (A) CLEAR   Specific Gravity, Urine 1.010 1.005 - 1.030   pH 5.0 5.0 - 8.0   Glucose, UA NEGATIVE NEGATIVE mg/dL   Hgb urine dipstick NEGATIVE NEGATIVE   Bilirubin Urine NEGATIVE NEGATIVE   Ketones, ur NEGATIVE  NEGATIVE mg/dL   Protein, ur NEGATIVE NEGATIVE mg/dL   Nitrite NEGATIVE NEGATIVE   Leukocytes, UA MODERATE (A) NEGATIVE  Urine microscopic-add on     Status: Abnormal   Collection Time: 01/15/15  6:51 PM  Result Value Ref Range   Squamous Epithelial / LPF 0-5 (A) NONE SEEN   WBC, UA TOO NUMEROUS TO COUNT 0 - 5 WBC/hpf   RBC / HPF 0-5 0 - 5 RBC/hpf   Bacteria, UA MANY (A) NONE SEEN  Glucose, capillary     Status: Abnormal   Collection Time: 01/15/15  9:33 PM  Result Value Ref Range   Glucose-Capillary 165 (H) 65 - 99 mg/dL   Comment 1 Notify RN    Comment 2 Document in Chart   Glucose, capillary     Status: Abnormal   Collection Time: 01/16/15  6:31 AM  Result Value Ref Range   Glucose-Capillary 147 (H) 65 - 99 mg/dL   Comment 1 Notify RN    Comment 2 Document in Chart   Glucose, capillary     Status: Abnormal   Collection Time: 01/16/15 11:45 AM  Result Value Ref Range   Glucose-Capillary 173 (H) 65 - 99 mg/dL  Glucose, capillary      Status: Abnormal   Collection Time: 01/16/15  4:25 PM  Result Value Ref Range   Glucose-Capillary 190 (H) 65 - 99 mg/dL  Culture, Urine     Status: None (Preliminary result)   Collection Time: 01/16/15  5:24 PM  Result Value Ref Range   Specimen Description URINE, CLEAN CATCH    Special Requests NONE    Culture TOO YOUNG TO READ    Report Status PENDING   Glucose, capillary     Status: Abnormal   Collection Time: 01/16/15  9:32 PM  Result Value Ref Range   Glucose-Capillary 195 (H) 65 - 99 mg/dL   Comment 1 Notify RN    Comment 2 Document in Chart   Basic metabolic panel     Status: Abnormal   Collection Time: 01/17/15  6:35 AM  Result Value Ref Range   Sodium 139 135 - 145 mmol/L   Potassium 3.5 3.5 - 5.1 mmol/L   Chloride 103 101 - 111 mmol/L   CO2 24 22 - 32 mmol/L   Glucose, Bld 172 (H) 65 - 99 mg/dL   BUN 29 (H) 6 - 20 mg/dL   Creatinine, Ser 0.84 0.44 - 1.00 mg/dL   Calcium 8.5 (L) 8.9 - 10.3 mg/dL   GFR calc non Af Amer >60 >60 mL/min   GFR calc Af Amer >60 >60 mL/min    Comment: (NOTE) The eGFR has been calculated using the CKD EPI equation. This calculation has not been validated in all clinical situations. eGFR's persistently <60 mL/min signify possible Chronic Kidney Disease.    Anion gap 12 5 - 15  Magnesium     Status: Abnormal   Collection Time: 01/17/15  6:35 AM  Result Value Ref Range   Magnesium 1.6 (L) 1.7 - 2.4 mg/dL  Glucose, capillary     Status: Abnormal   Collection Time: 01/17/15  6:38 AM  Result Value Ref Range   Glucose-Capillary 154 (H) 65 - 99 mg/dL   Comment 1 Notify RN    Comment 2 Document in Chart    No results found.     Medical Problem List and Plan: 1.  Left side weakness with gait instability secondary to right pontine infarct felt to be embolic  2.  DVT Prophylaxis/Anticoagulation: Eliquis 3. Pain Management: Tylenol as needed 4. Hypertension. Lopressor 50 mg twice a day. Monitor with increased mobility 5. Neuropsych: This  patient is capable of making decisions on her own behalf. 6. Skin/Wound Care: Routine skin checks 7. Fluids/Electrolytes/Nutrition: Routine I&O with follow-up chemistries 8. Diabetes mellitus with peripheral neuropathy. Hemoglobin A1c 7.6.  Check blood sugars before meals and at bedtime. Patient on Glucotrol 10 mg daily and glucophage 500 mg twice daily. Diabetic teaching 9. Hyperlipidemia. Lipitor 10. Hypothyroidism. Synthroid 11. UTI. Urine culture multi-species. Re-collect for urine culture given ongoing frequency and foul odor. Cipro discontinued.  Post Admission Physician Evaluation: 1. Functional deficits secondary  to right pontine infarct. 2. Patient is admitted to receive collaborative, interdisciplinary care between the physiatrist, rehab nursing staff, and therapy team. 3. Patient's level of medical complexity and substantial therapy needs in context of that medical necessity cannot be provided at a lesser intensity of care such as a SNF. 4. Patient has experienced substantial functional loss from his/her baseline which was documented above under the "Functional History" and "Functional Status" headings.  Judging by the patient's diagnosis, physical exam, and functional history, the patient has potential for functional progress which will result in measurable gains while on inpatient rehab.  These gains will be of substantial and practical use upon discharge  in facilitating mobility and self-care at the household level. 5. Physiatrist will provide 24 hour management of medical needs as well as oversight of the therapy plan/treatment and provide guidance as appropriate regarding the interaction of the two. 6. 24 hour rehab nursing will assist with bladder management, bowel management, safety, skin/wound care, disease management, medication administration, pain management and patient education  and help integrate therapy concepts, techniques,education, etc. 7. PT will assess and treat  for/with: Lower extremity strength, range of motion, stamina, balance, functional mobility, safety, adaptive techniques and equipment, NMR, community reintegration, stroke education.   Goals are: supervision to mod I. 8. OT will assess and treat for/with: ADL's, functional mobility, safety, upper extremity strength, adaptive techniques and equipment, NMR, ego support, family/caregiver education, ego support .   Goals are: mod I to min assist. Therapy may proceed with showering this patient. 9. SLP will assess and treat for/with: communication, swallowing, education.  Goals are: mod I. 10. Case Management and Social Worker will assess and treat for psychological issues and discharge planning. 11. Team conference will be held weekly to assess progress toward goals and to determine barriers to discharge. 12. Patient will receive at least 3 hours of therapy per day at least 5 days per week. 13. ELOS: 15-20 days       14. Prognosis:  excellent     Meredith Staggers, MD, [facility] 01/18/2015   01/17/2015

## 2015-01-18 NOTE — Interval H&P Note (Signed)
Valerie Pham was admitted today to Inpatient Rehabilitation with the diagnosis of right pontine infarct.  The patient's history has been reviewed, patient examined, and there is no change in status.  Patient continues to be appropriate for intensive inpatient rehabilitation.  I have reviewed the patient's chart and labs.  Questions were answered to the patient's satisfaction. The PAPE has been reviewed and assessment remains appropriate.  SWARTZ,ZACHARY T 01/18/2015, 7:54 PM

## 2015-01-18 NOTE — Progress Notes (Signed)
See post fall documentation.  Patient had red swollen index finger prior to fall due to  Arthritis Flair, applied ice, ROM intact.  VSS stable, no skin bruising or skin breaks.

## 2015-01-18 NOTE — Progress Notes (Signed)
Rehab admissions - I have approval from [facility] for acute inpatient rehab admission.  I met with son and caregiver, Bethena Roys.  Both are in agreement to inpatient rehab admission.  Bed available and can admit to inpatient rehab today if okay with attending MD.  Call me for questions.  #[phone]

## 2015-01-18 NOTE — Evaluation (Signed)
Speech Language Pathology Evaluation Patient Details Name: Valerie Pham MRN: [id number] DOB: Jun 16, 1927 Today's Date: 01/18/2015 Time: 0950 (session interrupted by nursing and MD (-10 minutes))-1050 SLP Time Calculation (min) (ACUTE ONLY): 60 min  Problem List:  Patient Active Problem List   Diagnosis Date Noted  . Generalized OA   . DM type 2 with diabetic peripheral neuropathy (HCC)   . Hypomagnesemia   . Urinary tract infection, site not specified   . Hemiparesis affecting nondominant side as late effect of stroke (HCC)   . CVA (cerebral vascular accident) (HCC) 01/14/2015  . Essential hypertension 01/14/2015  . DM type 2 (diabetes mellitus, type 2) (HCC) 01/14/2015  . Hypothyroidism 01/14/2015  . CVA (cerebral infarction) 01/14/2015   Past Medical History:  Past Medical History  Diagnosis Date  . Benign essential HTN   . DM2 (diabetes mellitus, type 2) (HCC)   . Hypothyroid   . Glaucoma    Past Surgical History:  Past Surgical History  Procedure Laterality Date  . Abdominal hysterectomy    . Tonsillectomy    . Appendectomy    . Cataract extraction, bilateral     HPI:  81 [facility] left sided weakness, slurred speech.  Pt found to have right pontine cva - Speech evaluation ordered.  Pt does have caregivers at home who help pt manage medications, bills, etc.     Assessment / Plan / Recommendation Clinical Impression  Pt presents with cognitive linguistic deficits and mild dysarthria as well as facial, trigeminal and hypoglossal nerve impairment.  Scoring on MOCA [facility](Montreal Cognitive Assessment) revealed deficits in areas of visuospatial skills *son and pt ? baseline worsening vision* and mental math - attention.  Pt becomes easily distracted by her own ideas *topic maintenance in conversation* but pt/son report this to be baseline.  She demostrated strengths in areas of memory  - recalling 4/5 words independently, orientation and language - Score on MOCA  was 21/30 however again ? impact of vision acuity on findings - despite pt having glasses.  Skilled SLP follow up indicated to maximize rehab and decrease caregiver burden.  Educated pt and son [name] to findings/recommendations using teach back for speech strategies and cognitive reinforcements.      SLP Assessment  Patient needs continued Speech Lanaguage Pathology Services    Follow Up Recommendations  Inpatient Rehab    Frequency and Duration min 2x/week  2 weeks      SLP Evaluation Prior Functioning  Cognitive/Linguistic Baseline: Within functional limits Education: Production assistant, radioBachelor's degree Education  Vocation: Retired   Engineer, building servicesCognition  Orientation Level: Oriented X4 Attention: Sustained Sustained Attention: Impaired Memory: Impaired Awareness: Impaired Problem Solving: Impaired Problem Solving Impairment: Functional basic Behaviors: Impulsive Safety/Judgment: Appears intact    Comprehension  Auditory Comprehension Overall Auditory Comprehension: Impaired Yes/No Questions: Not tested Commands: Within Functional Limits Conversation: Complex Other Conversation Comments: at times pt benefits from redirection due to personal distraction (conversation) - son [name] reports pt's attention is baseline function Interfering Components: Attention Visual Recognition/Discrimination Discrimination: Within Function Limits Reading Comprehension Reading Status: Impaired (pt reports she is "fuzzy"vision) Word level: Impaired (? vision deficits - pt reports items are "fuzzy") Sentence Level: Within functional limits (pt able to read large sign on the wall without difficulties) Paragraph Level: Not tested Functional Environmental (signs, name badge): Impaired Effective Techniques: Large print;Eye glasses    Expression Expression Primary Mode of Expression: Verbal Verbal Expression Overall Verbal Expression: Appears within functional limits for tasks assessed Initiation: No impairment Level  of  Generative/Spontaneous Verbalization: Conversation Repetition: No impairment Naming: No impairment Pragmatics: No impairment Written Expression Dominant Hand: Right Written Expression: Not tested   Oral / Motor Oral Motor/Sensory Function Overall Oral Motor/Sensory Function: Mild impairment Facial ROM: Suspected CN VII (facial) dysfunction;Reduced left Facial Symmetry: Abnormal symmetry left;Suspected CN VII (facial) dysfunction Facial Strength: Suspected CN VII (facial) dysfunction;Reduced left Facial Sensation: Suspected CN V (Trigeminal) dysfunction Lingual Strength: Suspected CN XII (hypoglossal) dysfunction Motor Speech Overall Motor Speech: Impaired Respiration: Within functional limits Phonation: Normal Resonance: Within functional limits Articulation: Impaired Level of Impairment: Phrase Intelligibility: Intelligibility reduced Word: 75-100% accurate Phrase: 75-100% accurate Sentence: 75-100% accurate Conversation: 75-100% accurate Motor Planning: Witnin functional limits Motor Speech Errors: Not applicable Effective Techniques: Slow rate;Over-articulate    Donavan Burnet, MS [facility] SLP [phone]

## 2015-01-18 NOTE — Progress Notes (Deleted)
   01/18/15 0516  What Happened  Was fall witnessed? No

## 2015-01-18 NOTE — Discharge Instructions (Signed)
Apixaban oral tablets °What is this medicine? °APIXABAN (a PIX a ban) is an anticoagulant (blood thinner). It is used to lower the chance of stroke in people with a medical condition called atrial fibrillation. It is also used to treat or prevent blood clots in the lungs or in the veins. °This medicine may be used for other purposes; ask your health care provider or pharmacist if you have questions. °What should I tell my health care provider before I take this medicine? °They need to know if you have any of these conditions: °-bleeding disorders °-bleeding in the brain °-blood in your stools (black or tarry stools) or if you have blood in your vomit °-history of stomach bleeding °-kidney disease °-liver disease °-mechanical heart valve °-an unusual or allergic reaction to apixaban, other medicines, foods, dyes, or preservatives °-pregnant or trying to get pregnant °-breast-feeding °How should I use this medicine? °Take this medicine by mouth with a glass of water. Follow the directions on the prescription label. You can take it with or without food. If it upsets your stomach, take it with food. Take your medicine at regular intervals. Do not take it more often than directed. Do not stop taking except on your doctor's advice. Stopping this medicine may increase your risk of a blot clot. Be sure to refill your prescription before you run out of medicine. °Talk to your pediatrician regarding the use of this medicine in children. Special care may be needed. °Overdosage: If you think you have taken too much of this medicine contact a poison control center or emergency room at once. °NOTE: This medicine is only for you. Do not share this medicine with others. °What if I miss a dose? °If you miss a dose, take it as soon as you can. If it is almost time for your next dose, take only that dose. Do not take double or extra doses. °What may interact with this medicine? °This medicine may interact with the following: °-aspirin  and aspirin-like medicines °-certain medicines for fungal infections like ketoconazole and itraconazole °-certain medicines for seizures like carbamazepine and phenytoin °-certain medicines that treat or prevent blood clots like warfarin, enoxaparin, and dalteparin °-clarithromycin °-NSAIDs, medicines for pain and inflammation, like ibuprofen or naproxen °-rifampin °-ritonavir °-St. John's wort °This list may not describe all possible interactions. Give your health care provider a list of all the medicines, herbs, non-prescription drugs, or dietary supplements you use. Also tell them if you smoke, drink alcohol, or use illegal drugs. Some items may interact with your medicine. °What should I watch for while using this medicine? °Notify your doctor or health care professional and seek emergency treatment if you develop breathing problems; changes in vision; chest pain; severe, sudden headache; pain, swelling, warmth in the leg; trouble speaking; sudden numbness or weakness of the face, arm, or leg. These can be signs that your condition has gotten worse. °If you are going to have surgery, tell your doctor or health care professional that you are taking this medicine. °Tell your health care professional that you use this medicine before you have a spinal or epidural procedure. Sometimes people who take this medicine have bleeding problems around the spine when they have a spinal or epidural procedure. This bleeding is very rare. If you have a spinal or epidural procedure while on this medicine, call your health care professional immediately if you have back pain, numbness or tingling (especially in your legs and feet), muscle weakness, paralysis, or loss of bladder or bowel   control. °Avoid sports and activities that might cause injury while you are using this medicine. Severe falls or injuries can cause unseen bleeding. Be careful when using sharp tools or knives. Consider using an electric razor. Take special care  brushing or flossing your teeth. Report any injuries, bruising, or red spots on the skin to your doctor or health care professional. °What side effects may I notice from receiving this medicine? °Side effects that you should report to your doctor or health care professional as soon as possible: °-allergic reactions like skin rash, itching or hives, swelling of the face, lips, or tongue °-signs and symptoms of bleeding such as bloody or black, tarry stools; red or dark-brown urine; spitting up blood or brown material that looks like coffee grounds; red spots on the skin; unusual bruising or bleeding from the eye, gums, or nose °This list may not describe all possible side effects. Call your doctor for medical advice about side effects. You may report side effects to FDA at 1-800-FDA-1088. °Where should I keep my medicine? °Keep out of the reach of children. °Store at room temperature between 20 and 25 degrees C (68 and 77 degrees F). Throw away any unused medicine after the expiration date. °NOTE: This sheet is a summary. It may not cover all possible information. If you have questions about this medicine, talk to your doctor, pharmacist, or health care provider. °  °© 2016, Elsevier/Gold Standard. (2012-09-19 11:59:24) ° °Stroke Prevention °Some medical conditions and behaviors are associated with an increased chance of having a stroke. You may prevent a stroke by making healthy choices and managing medical conditions. °HOW CAN I REDUCE MY RISK OF HAVING A STROKE?  °· Stay physically active. Get at least 30 minutes of activity on most or all days. °· Do not smoke. It may also be helpful to avoid exposure to secondhand smoke. °· Limit alcohol use. Moderate alcohol use is considered to be: °¨ No more than 2 drinks per day for men. °¨ No more than 1 drink per day for nonpregnant women. °· Eat healthy foods. This involves: °¨ Eating 5 or more servings of fruits and vegetables a day. °¨ Making dietary changes that  address high blood pressure (hypertension), high cholesterol, diabetes, or obesity. °· Manage your cholesterol levels. °¨ Making food choices that are high in fiber and low in saturated fat, trans fat, and cholesterol may control cholesterol levels. °¨ Take any prescribed medicines to control cholesterol as directed by your health care provider. °· Manage your diabetes. °¨ Controlling your carbohydrate and sugar intake is recommended to manage diabetes. °¨ Take any prescribed medicines to control diabetes as directed by your health care provider. °· Control your hypertension. °¨ Making food choices that are low in salt (sodium), saturated fat, trans fat, and cholesterol is recommended to manage hypertension. °¨ Ask your health care provider if you need treatment to lower your blood pressure. Take any prescribed medicines to control hypertension as directed by your health care provider. °¨ If you are 18-39 years of age, have your blood pressure checked every 3-5 years. If you are 40 years of age or older, have your blood pressure checked every year. °· Maintain a healthy weight. °¨ Reducing calorie intake and making food choices that are low in sodium, saturated fat, trans fat, and cholesterol are recommended to manage weight. °· Stop drug abuse. °· Avoid taking birth control pills. °¨ Talk to your health care provider about the risks of taking birth control pills if   you are over 35 years old, smoke, get migraines, or have ever had a blood clot. °· Get evaluated for sleep disorders (sleep apnea). °¨ Talk to your health care provider about getting a sleep evaluation if you snore a lot or have excessive sleepiness. °· Take medicines only as directed by your health care provider. °¨ For some people, aspirin or blood thinners (anticoagulants) are helpful in reducing the risk of forming abnormal blood clots that can lead to stroke. If you have the irregular heart rhythm of atrial fibrillation, you should be on a blood  thinner unless there is a good reason you cannot take them. °¨ Understand all your medicine instructions. °· Make sure that other conditions (such as anemia or atherosclerosis) are addressed. °SEEK IMMEDIATE MEDICAL CARE IF:  °· You have sudden weakness or numbness of the face, arm, or leg, especially on one side of the body. °· Your face or eyelid droops to one side. °· You have sudden confusion. °· You have trouble speaking (aphasia) or understanding. °· You have sudden trouble seeing in one or both eyes. °· You have sudden trouble walking. °· You have dizziness. °· You have a loss of balance or coordination. °· You have a sudden, severe headache with no known cause. °· You have new chest pain or an irregular heartbeat. °Any of these symptoms may represent a serious problem that is an emergency. Do not wait to see if the symptoms will go away. Get medical help at once. Call your local emergency services (911 in [place] Do not drive yourself to the hospital. °  °This information is not intended to replace advice given to you by your health care provider. Make sure you discuss any questions you have with your health care provider. °  °Document Released: 02/23/2004 Document Revised: 02/05/2014 Document Reviewed: 07/18/2012 °Elsevier Interactive Patient Education ©2016 Elsevier Inc. ° ° °

## 2015-01-18 NOTE — PMR Pre-admission (Signed)
PMR Admission Coordinator Pre-Admission Assessment  Patient: Valerie Pham is an 81 y.o., female MRN: [id number] DOB: 07-Aug-1927 Height: 5' 7" (170.2 cm) Weight: 99 kg (218 lb 4.1 oz)              Insurance Information HMO:    PPO: Yes     PCP:       IPA:       80/20:       OTHER:  Group #[id number] PRIMARY: UHC medicare      Policy#: [id number]      Subscriber: Clarnce Flock CM Name: Sherlynn Stalls      Phone#: [phone]     Fax#:   Pre-Cert#: [id number] with updates by onsite reviewer      Employer: Retired Benefits:  Phone #: [phone]     Name:  Automated Eff. Date: 01/29/14     Deduct: $0      Out of Pocket Max: $4000 (met [phone]      Life Max: None CIR: $160 days 1-10      SNF: $0 days 1-20; $50 days 21-100 Outpatient: medical necessity     Co-Pay: $20 Home Health: 100%      Co-Pay: none DME: 80%     Co-Pay: 20% Providers: in network  Emergency Contact Information Contact Information    Name Relation Home Work Vadito Daughter [phone]  [phone]   Doristine Section [phone]  [phone]   Gilberto Better Daughter [phone]       Current Medical History  Patient Admitting Diagnosis: Right pontine CVA  History of Present Illness: A 81 y.o. right handed female with history of hypertension, hypothyroidism, diabetes mellitus and peripheral neuropathy. By report patient uses a cane and lives alone in [place] had caregivers 5 hours a day for help of activities and ADLs. Presented [facility]/13/2015 from [facility] with left-sided weakness and slurred speech. Patient did not receive TPA. MRI at outside hospital revealed right pontine CVA. Echocardiogram showed ejection fraction of 70% grade 1 diastolic dysfunction. No cardiac source of emboli. MRA of head and neck without large vessel occlusion or stenosis. Neurology follow-up maintained on aspirin for CVA prophylaxis. Subcutaneous Lovenox for DVT prophylaxis. Tolerating a regular consistency diet. Urine study  many bacteria and WBCs too numerous to count placed on empiric Cipro. Physical therapy evaluation completed 01/15/2015 with recommendations of physical medicine rehabilitation consult.    Total: 8=NIH  Past Medical History  Past Medical History  Diagnosis Date  . Benign essential HTN   . DM2 (diabetes mellitus, type 2) ([place])   . Hypothyroid   . Glaucoma     Family History  family history includes CAD in her father; Hypertension in her mother; Stroke in her mother.  Prior Rehab/Hospitalizations: No previous rehab  Has the patient had major surgery during 100 days prior to admission? No  Current Medications   Current facility-administered medications:  .  apixaban (ELIQUIS) tablet 5 mg, 5 mg, Oral, Q12H, Modena Jansky, MD, 5 mg at 01/18/15 [phone] .  atorvastatin (LIPITOR) tablet 20 mg, 20 mg, Oral, q1800, David L Rinehuls, PA-C, 20 mg at 01/17/15 1827 .  ciprofloxacin (CIPRO) tablet 250 mg, 250 mg, Oral, BID, David L Rinehuls, PA-C, 250 mg at 01/18/15 0815 .  dorzolamide-timolol (COSOPT) 22.3-6.8 MG/ML ophthalmic solution 1 drop, 1 drop, Both Eyes, BID, Dianne Dun, NP, 1 drop at 01/18/15 1002 .  insulin aspart (novoLOG) injection 0-5 Units, 0-5 Units, Subcutaneous, QHS, Harvette Evonnie Dawes, MD .  insulin aspart (  novoLOG) injection 0-9 Units, 0-9 Units, Subcutaneous, TID WC, Theressa Millard, MD, 2 Units at 01/18/15 1240 .  levothyroxine (SYNTHROID, LEVOTHROID) tablet 75 mcg, 75 mcg, Oral, QAC breakfast, Modena Jansky, MD, 75 mcg at 01/18/15 0815 .  metoprolol (LOPRESSOR) tablet 50 mg, 50 mg, Oral, BID, David L Rinehuls, PA-C, 50 mg at 01/18/15 1002 .  senna-docusate (Senokot-S) tablet 1 tablet, 1 tablet, Oral, QHS PRN, Theressa Millard, MD  Patients Current Diet: Diet heart healthy/carb modified Room service appropriate?: Yes; Fluid consistency:: Thin Diet - low sodium heart healthy Diet Carb Modified  Precautions / Restrictions Precautions Precautions:  Fall Precaution Comments:   Restrictions Weight Bearing Restrictions: No   Has the patient had 2 or more falls or a fall with injury in the past year?No.  However did have 1 fall in the hospital on 01/17/15  Prior Activity Level Community (5-7x/wk): Went out daily.  Rode of the [place] to [facility] for few hours a day.  Home Assistive Devices / Equipment Home Assistive Devices/Equipment: None  Prior Device Use: Indicate devices/aids used by the patient prior to current illness, exacerbation or injury? No device  Prior Functional Level Prior Function Level of Independence: Independent (had caregivers for 5 hours a day for help of activities ) Comments: has caregiver 2 days/week to assist with IADL tasks. [place] bus to [facility] 2 days/wk.  Self Care: Did the patient need help bathing, dressing, using the toilet or eating?  Needed some help.  Caregiver laid patient's clothes out, but patient likely could get her own clothes out PTA.  Indoor Mobility: Did the patient need assistance with walking from room to room (with or without device)? Independent  Stairs: Did the patient need assistance with internal or external stairs (with or without device)? Needed some help  Functional Cognition: Did the patient need help planning regular tasks such as shopping or remembering to take medications? Independent  Current Functional Level Cognition  Overall Cognitive Status: Impaired/Different from baseline Current Attention Level: Selective Orientation Level: Oriented X4 Following Commands: Follows one step commands consistently Safety/Judgement: Decreased awareness of safety, Decreased awareness of deficits General Comments: mod A with transfer and pt thinks she can walk to bathroom alone Attention: Sustained Sustained Attention: Impaired Memory: Impaired Awareness: Impaired Problem Solving: Impaired Problem Solving Impairment: Functional basic Behaviors: Impulsive Safety/Judgment:  Appears intact    Extremity Assessment (includes Sensation/Coordination)  Upper Extremity Assessment: LUE deficits/detail LUE Deficits / Details: isolated movement. generalized weakness. Able to complete @ 60 shoulder FF. gross grasp and release. minimal isolated finger movement. unable to complete hand to mouth with L hand. difficulty maintaingin grasp on object . Attempting to use during functional transfers. Poor positional awareness LUE at times.Daughter reposts LUE movement has improved since yesterday. LUE Sensation: decreased light touch LUE Coordination: decreased fine motor, decreased gross motor  Lower Extremity Assessment: LLE deficits/detail LLE Deficits / Details: weakness. isolated movemetn    ADLs  Overall ADL's : Needs assistance/impaired Eating/Feeding: Minimal assistance Grooming: Moderate assistance Upper Body Bathing: Moderate assistance Lower Body Bathing: Moderate assistance Upper Body Dressing : Moderate assistance Lower Body Dressing: Moderate assistance Lower Body Dressing Details (indicate cue type and reason): able to lean over to pull up socks BLE Toilet Transfer: Moderate assistance, Stand-pivot Toileting- Clothing Manipulation and Hygiene: Maximal assistance Toileting - Clothing Manipulation Details (indicate cue type and reason): incontinent urine Functional mobility during ADLs: Moderate assistance (+2 for ambulation) General ADL Comments: Poor insight/awareness into deficits. Daughter asking many quesitons regarding  rehab process and if her mother will be able to return to PLOF. Educaated daughter on rehab options.    Mobility  Overal bed mobility: Needs Assistance Bed Mobility: Supine to Sit Rolling: Min assist Sidelying to sit: Min assist Supine to sit: Mod assist General bed mobility comments: Assist to elevate trunk to full sitting position and to scoot around to the EOB. Bed pad used for assist.     Transfers  Overall transfer level: Needs  assistance Equipment used: Rolling walker (2 wheeled) Transfers: Sit to/from Stand Sit to Stand: Min assist, +2 physical assistance, From elevated surface Stand pivot transfers: Min assist, +2 physical assistance General transfer comment: +2 for power-up and safety with transition to [facility]. VC's for hand placement on seated surface for safety.     Ambulation / Gait / Stairs / Wheelchair Mobility  Ambulation/Gait Ambulation/Gait assistance: Mod assist Ambulation Distance (Feet): 35 Feet Assistive device: Rolling walker (2 wheeled) Gait Pattern/deviations: Step-through pattern, Decreased stride length, Decreased weight shift to left, Decreased step length - left General Gait Details: VC's for improved posture, looking up, and increased step length on the L. Pt fatigued quickly but was able to sequence better with the RW today.  Gait velocity: Decreased Gait velocity interpretation: Below normal speed for age/gender    Posture / Balance Balance Overall balance assessment: Needs assistance Sitting-balance support: Feet supported, No upper extremity supported Sitting balance-Leahy Scale: Fair Postural control: Posterior lean Standing balance support: Bilateral upper extremity supported, During functional activity Standing balance-Leahy Scale: Poor    Special needs/care consideration BiPAP/CPAP No CPM No Continuous Drip IV No Dialysis No        Life Vest No Oxygen No Special Bed No Trach Size No Wound Vac (area) No     Skin Left hand is swollen and right arm with bruising.                            Bowel mgmt: Last BM 01/17/15 Bladder mgmt: Incontinent at times.  Wears pull-ups at times at home. Diabetic mgmt Yes, on oral medications at home, but on insulin in hospital.    Previous Home Environment Living Arrangements: [place]: No  Discharge Living Setting Plans for Discharge Living Setting: Patient's home, Alone, House (Lives alone, but has a caregiver.) Type  of Home at Discharge: House Discharge Home Layout: Two level, Able to live on main level with bedroom/bathroom Alternate Level Stairs-Number of Steps: Flight Discharge Home Access: Stairs to enter Entrance Stairs-Number of Steps: 2-3 steps side entry, front has about 10 steps Does the patient have any problems obtaining your medications?: No  Social/Family/Support Systems Patient Roles: Parent (Has 5 children, 4 are local.) Contact Information: Threasa Heads - daughter [phone] Anticipated Caregiver: Roberts Gaudy Anticipated Caregiver's Contact Information: Bethena Roys - caregiver (h) [phone] Ability/Limitations of Caregiver: Caregiver comes 2 hrs in am and 3 hrs in pm.  Bethena Roys can increase her hours.  Also other family will assist as needed. Caregiver Availability: Other (Comment) (Family will provide amount of care needed for patient.) Discharge Plan Discussed with Primary Caregiver: Yes Is Caregiver In Agreement with Plan?: Yes Does Caregiver/Family have Issues with Lodging/Transportation while Pt is in Rehab?: No  Goals/Additional Needs Patient/Family Goal for Rehab: PT/OT/ST mod I and supervision goals Expected length of stay: 12-14 days Cultural Considerations: [facility] Dietary Needs: Heart healthy, carb mod, thin liquids Equipment Needs: TBD Pt/Family Agrees to Admission and willing to participate:  Yes Program Orientation Provided & Reviewed with Pt/Caregiver Including Roles  & Responsibilities: Yes  Decrease burden of Care through IP rehab admission: N/A  Possible need for SNF placement upon discharge: Not planned  Patient Condition: This patient's condition remains as documented in the consult dated 01/17/15, in which the Rehabilitation Physician determined and documented that the patient's condition is appropriate for intensive rehabilitative care in an inpatient rehabilitation facility. Will admit to inpatient rehab today.  Preadmission Screen Completed By:   [name], 01/18/2015 2:25 PM ______________________________________________________________________   Discussed status with Dr. Naaman Plummer on 01/18/15 at 1425 and received telephone approval for admission today.  Admission Coordinator:  [name], time1425/Date12/20/16

## 2015-01-18 NOTE — Progress Notes (Signed)
   01/18/15 0516  What Happened  Was fall witnessed? No  Was patient injured? No  Patient found on floor  Found by Staff-comment Guy Franco(Cindy Maeley Matton RN)  Stated prior activity to/from bed, chair, or stretcher (with assist only)  Follow Up  MD notified Veronia BeetsK Shorr NP  Time MD notified [phone]  Family notified Yes-comment (daughter called)  Time family notified 0600  Additional tests No  Simple treatment Ice (Left index finger)  Progress note created (see row info) Yes

## 2015-01-18 NOTE — Progress Notes (Signed)
Pt transported to rehab 4M02. Pt stable, no noted distress. Pt denies pain or discomfort. Report given to nurse

## 2015-01-18 NOTE — Progress Notes (Signed)
Inpatient Diabetes Program Recommendations  AACE/ADA: New Consensus Statement on Inpatient Glycemic Control (2015)  Target Ranges:  Prepandial:   less than 140 mg/dL      Peak postprandial:   less than 180 mg/dL (1-2 hours)      Critically ill patients:  140 - 180 mg/dL   Results for Valerie Pham, Shavaughn H (MRN [id number]) as of 01/18/2015 09:10  Ref. Range 01/17/2015 06:38 01/17/2015 11:40 01/17/2015 17:04 01/17/2015 21:46 01/18/2015 06:34  Glucose-Capillary Latest Ref Range: 65-99 mg/dL 409154 (H) 811289 (H) 914166 (H) 159 (H) 252 (H)   Review of Glycemic Control  Outpatient Diabetes medications: Glipizide 10 Daily, Metformin 500 mg BID Current orders for Inpatient glycemic control: Novolog Sensitive + HS  Inpatient Diabetes Program Recommendations: Correction (SSI): Patient's glucose trending high 100's-200 range. Please consider increasing correction scale to Novolog Moderate Correction.  Thanks,  Christena DeemShannon Mylah Baynes RN, MSN, [facility] Inpatient Diabetes Coordinator Team Pager [phone] (8a-5p)  Thanks,  Christena DeemShannon Deveon Kisiel RN, MSN, [facility] Inpatient Diabetes Coordinator Team Pager [phone] (8a-5p)

## 2015-01-19 ENCOUNTER — Inpatient Hospital Stay (HOSPITAL_COMMUNITY): Payer: Medicare Other | Admitting: Speech Pathology

## 2015-01-19 ENCOUNTER — Inpatient Hospital Stay (HOSPITAL_COMMUNITY): Payer: Medicare Other | Admitting: Occupational Therapy

## 2015-01-19 ENCOUNTER — Inpatient Hospital Stay (HOSPITAL_COMMUNITY): Payer: Medicare Other

## 2015-01-19 ENCOUNTER — Inpatient Hospital Stay (HOSPITAL_COMMUNITY): Payer: Medicare Other | Admitting: Physical Therapy

## 2015-01-19 DIAGNOSIS — E039 Hypothyroidism, unspecified: Secondary | ICD-10-CM

## 2015-01-19 DIAGNOSIS — M10042 Idiopathic gout, left hand: Secondary | ICD-10-CM

## 2015-01-19 DIAGNOSIS — M159 Polyosteoarthritis, unspecified: Secondary | ICD-10-CM

## 2015-01-19 DIAGNOSIS — M109 Gout, unspecified: Secondary | ICD-10-CM | POA: Diagnosis present

## 2015-01-19 LAB — COMPREHENSIVE METABOLIC PANEL
ALBUMIN: 3.2 g/dL — AB (ref 3.5–5.0)
ALK PHOS: 58 U/L (ref 38–126)
ALT: 16 U/L (ref 14–54)
AST: 16 U/L (ref 15–41)
Anion gap: 10 (ref 5–15)
BILIRUBIN TOTAL: 0.7 mg/dL (ref 0.3–1.2)
BUN: 27 mg/dL — ABNORMAL HIGH (ref 6–20)
CALCIUM: 8.5 mg/dL — AB (ref 8.9–10.3)
CO2: 23 mmol/L (ref 22–32)
CREATININE: 0.83 mg/dL (ref 0.44–1.00)
Chloride: 105 mmol/L (ref 101–111)
GFR calc Af Amer: >60 mL/min (ref >60)
GFR calc non Af Amer: >60 mL/min (ref >60)
GLUCOSE: 226 mg/dL — AB (ref 65–99)
Potassium: 3.6 mmol/L (ref 3.5–5.1)
SODIUM: 138 mmol/L (ref 135–145)
TOTAL PROTEIN: 6.5 g/dL (ref 6.5–8.1)

## 2015-01-19 LAB — CBC WITH DIFFERENTIAL/PLATELET
BASOS ABS: 0 10*3/uL (ref 0.0–0.1)
BASOS PCT: 1 %
Eosinophils Absolute: 0.2 10*3/uL (ref 0.0–0.7)
Eosinophils Relative: 2 %
HEMATOCRIT: 40 % (ref 36.0–46.0)
HEMOGLOBIN: 12.9 g/dL (ref 12.0–15.0)
Lymphocytes Relative: 22 %
Lymphs Abs: 1.6 10*3/uL (ref 0.7–4.0)
MCH: 29.6 pg (ref 26.0–34.0)
MCHC: 32.3 g/dL (ref 30.0–36.0)
MCV: 91.7 fL (ref 78.0–100.0)
Monocytes Absolute: 0.7 10*3/uL (ref 0.1–1.0)
Monocytes Relative: 9 %
NEUTROS ABS: 4.8 10*3/uL (ref 1.7–7.7)
NEUTROS PCT: 66 %
Platelets: 194 10*3/uL (ref 150–400)
RBC: 4.36 MIL/uL (ref 3.87–5.11)
RDW: 15.2 % (ref 11.5–15.5)
WBC: 7.3 10*3/uL (ref 4.0–10.5)

## 2015-01-19 LAB — GLUCOSE, CAPILLARY
Glucose-Capillary: 200 mg/dL — ABNORMAL HIGH (ref 65–99)
Glucose-Capillary: 167 mg/dL — ABNORMAL HIGH (ref 65–99)
Glucose-Capillary: 139 mg/dL — ABNORMAL HIGH (ref 65–99)
Glucose-Capillary: 175 mg/dL — ABNORMAL HIGH (ref 65–99)

## 2015-01-19 LAB — URIC ACID: Uric Acid, Serum: 7.6 mg/dL — ABNORMAL HIGH (ref 2.3–6.6)

## 2015-01-19 MED ORDER — BRIMONIDINE TARTRATE 0.2 % OP SOLN
1.0000 [drp] | Freq: Two times a day (BID) | OPHTHALMIC | Status: DC
  Administered 2015-01-19 – 2015-02-03 (×30): 1 [drp] via OPHTHALMIC
  Filled 2015-01-19 (×2): qty 5

## 2015-01-19 MED ORDER — AMLODIPINE BESYLATE 2.5 MG PO TABS
2.5000 mg | ORAL_TABLET | Freq: Every day | ORAL | Status: DC
  Administered 2015-01-19: 2.5 mg via ORAL
  Filled 2015-01-19 (×2): qty 1

## 2015-01-19 MED ORDER — COLCHICINE 0.6 MG PO TABS: 0.6000 mg | ORAL_TABLET | Freq: Every day | ORAL | Status: DC

## 2015-01-19 MED ORDER — OCUVITE-LUTEIN PO CAPS
1.0000 | ORAL_CAPSULE | Freq: Every day | ORAL | Status: DC
  Filled 2015-01-19: qty 1

## 2015-01-19 MED ORDER — COLCHICINE 0.6 MG PO TABS: 1.2000 mg | ORAL_TABLET | Freq: Once | ORAL | Status: DC

## 2015-01-19 MED ORDER — OCUVITE PO TABS
1.0000 | ORAL_TABLET | Freq: Every day | ORAL | Status: DC
  Administered 2015-01-19 – 2015-02-03 (×16): 1 via ORAL
  Filled 2015-01-19 (×19): qty 1

## 2015-01-19 NOTE — Progress Notes (Signed)
Patient information reviewed and entered into eRehab system by Kista Robb, RN, CRRN, PPS Coordinator.  Information including medical coding and functional independence measure will be reviewed and updated through discharge.     Per nursing patient was given "Data Collection Information Summary for Patients in Inpatient Rehabilitation Facilities with attached "Privacy Act Statement-Health Care Records" upon admission.  

## 2015-01-19 NOTE — Plan of Care (Signed)
Problem: RH BLADDER ELIMINATION Goal: RH STG MANAGE BLADDER WITH ASSISTANCE STG Manage Bladder With Assistance. Mod I  Outcome: Not Progressing Patient incontinent and requires toileting every 2 hours

## 2015-01-19 NOTE — Evaluation (Signed)
Speech Language Pathology Assessment and Plan  Patient Details  Name: REBIE Pham MRN: [id number] Date of Birth: 1927/08/09  SLP Diagnosis: Dysarthria  Rehab Potential: Good ELOS: 14-17 days     Today's Date: 01/19/2015 SLP Individual Time: 3354-5625 SLP Individual Time Calculation (min): 58 min   Problem List:  Patient Active Problem List   Diagnosis Date Noted  . Gout attack 01/19/2015  . Bacterial UTI   . Generalized OA   . DM type 2 with diabetic peripheral neuropathy ([place])   . Hypomagnesemia   . Urinary tract infection, site not specified   . Hemiparesis affecting nondominant side as late effect of stroke ([place])   . CVA (cerebral vascular accident) ([place]) 01/14/2015  . Essential hypertension 01/14/2015  . DM type 2 (diabetes mellitus, type 2) ([place]) 01/14/2015  . Hypothyroidism 01/14/2015  . Right pontine cerebrovascular accident [facility]) 01/14/2015   Past Medical History:  Past Medical History  Diagnosis Date  . Benign essential HTN   . DM2 (diabetes mellitus, type 2) ([place])   . Hypothyroid   . Glaucoma    Past Surgical History:  Past Surgical History  Procedure Laterality Date  . Abdominal hysterectomy    . Tonsillectomy    . Appendectomy    . Cataract extraction, bilateral      Assessment / Plan / Recommendation Clinical Impression   Valerie Pham is a 81 yo fmale who presented 01/13/2015 from [facility] with left-sided weakness and slurred speech. MRI at outside hospital revealed right pontine CVA.  Patient was admitted for comprehensive rehabilitation program on 01/18/2015.  SLP evaluation completed on 01/19/2015 with the following results: Pt presents with grossly intact cognitive function when baseline mentation is taken into consideration.  Pt's receptive and expressive language also appear grossly intact within a functional context.  Pt exhibits a mild, mixed dysarthria characterized by impaired coordination for articulation and left sided labial and  facial weakness which impacts her articulatory precision and results in decreased intelligibility at the conversational level.  Pt would benefit from skilled ST while inpatient 3x/week in order to maximize functional independence and reduce burden of care prior to discharge.  Pt may benefit from outpatient ST follow up upon discharge pending progress made while inpatient.    Skilled Therapeutic Interventions          Cognitive-linguistic evaluation completed with results and recommendations reviewed with family.     SLP Assessment  Patient will need skilled [place] [facility] during CIR admission    Recommendations  Patient destination: Home Follow up Recommendations: 24 hour supervision/assistance;Outpatient SLP Equipment Recommended: None recommended by SLP    SLP Frequency 1 to 3 out of 7 days   SLP Treatment/Interventions Cueing hierarchy;Environmental controls;Functional tasks;Patient/family education;Internal/external aids;Speech/Language facilitation   Pain Pain Assessment Pain Assessment: No/denies pain Prior Functioning Cognitive/Linguistic Baseline: Baseline deficits Baseline deficit details: pt had a paid caregiver 5 hours daily, attended "adult day care," family managed medications and finances  Type of Home: House  Lives With: Alone Available Help at Discharge: Family;Personal care attendant;Available PRN/intermittently Education: Bachelor's degree Education  Vocation: Retired  Function:  [employer]                 Cognition Comprehension Comprehension assist level: Understands basic 90% of the time/cues < 10% of the time  Expression   Expression assist level: Expresses basic 75 - 89% of the time/requires cueing 10 - 24% of the time. Needs helper to occlude trach/needs to repeat words.  Social  Interaction Social Interaction assist level: Interacts appropriately 90% of the time - Needs monitoring or encouragement for participation or  interaction.  Problem Solving Problem solving assist level: Solves basic 75 - 89% of the time/requires cueing 10 - 24% of the time  Memory Memory assist level: Recognizes or recalls 50 - 74% of the time/requires cueing 25 - 49% of the time   Short Term Goals: Week 1: SLP Short Term Goal 1 (Week 1): Pt will achieve intelligibility in conversations with supervision cues over 3 targeted sessions.   SLP Short Term Goal 2 (Week 1): Pt will utilize compensatory dysarthria strategies in conversations wtih supervision cues.    Refer to Care Plan for Long Term Goals  Recommendations for other services: None  Discharge Criteria: Patient will be discharged from SLP if patient refuses treatment 3 consecutive times without medical reason, if treatment goals not met, if there is a change in medical status, if patient makes no progress towards goals or if patient is discharged from hospital.  The above assessment, treatment plan, treatment alternatives and goals were discussed and mutually agreed upon: by patient and by family  Emilio Math 01/19/2015, 4:04 PM

## 2015-01-19 NOTE — IPOC Note (Signed)
Overall Plan of Care [facility](IPOC) Patient Details Name: Valerie Pham MRN: [id number] DOB: [birth year]  Admitting Diagnosis: R CVA  Hospital Problems: Principal Problem:   Right pontine cerebrovascular accident [facility](HCC) Active Problems:   Essential hypertension   Hypothyroidism   Generalized OA   DM type 2 with diabetic peripheral neuropathy (HCC)   Urinary tract infection, site not specified   Hemiparesis affecting nondominant side as late effect of stroke (HCC)   Bacterial UTI   Gout attack     Functional Problem List: Nursing Safety, Skin Integrity  PT Balance, Safety, Endurance, Motor  OT Balance, Cognition, Endurance, Motor, Pain, Perception, Safety, Vision  SLP Linguistic  TR         Basic ADL's: OT Eating, Grooming, Bathing, Dressing, Toileting     Advanced  ADL's: OT       Transfers: PT Bed Mobility, Bed to Chair, Furniture, Customer service managerCar  OT Toilet, Research scientist (life sciences)Tub/Shower     Locomotion: PT Ambulation, Psychologist, prison and probation servicesWheelchair Mobility, Stairs     Additional Impairments: OT Fuctional Use of Upper Extremity  SLP Communication expression    TR      Anticipated Outcomes Item Anticipated Outcome  Self Feeding Supervision/setup  Swallowing      Basic self-care  Marketing executiveupervision  Toileting  Supervision   Bathroom Transfers Supervision  Bowel/Bladder  Mod I  Transfers  supervision  Locomotion  supervision with LRAD  Communication  mod I   Cognition     Pain  <3  Safety/Judgment  Supervision   Therapy Plan: PT Intensity: Minimum of 1-2 x/day ,45 to 90 minutes PT Frequency: 5 out of 7 days PT Duration Estimated Length of Stay: 14-17 days OT Intensity: Minimum of 1-2 x/day, 45 to 90 minutes OT Frequency: 5 out of 7 days OT Duration/Estimated Length of Stay: 14-17 days SLP Intensity: Minumum of 1-2 x/day, 30 to 90 minutes SLP Frequency: 1 to 3 out of 7 days SLP Duration/Estimated Length of Stay: 14-17 days        Team Interventions: Nursing Interventions Patient/Family Education,  Disease Management/Prevention, Skin Care/Wound Management  PT interventions Ambulation/gait training, Warden/rangerBalance/vestibular training, FirefighterCommunity reintegration, Discharge planning, Disease management/prevention, Fish farm managerDME/adaptive equipment instruction, Neuromuscular re-education, Functional mobility training, Patient/family education, Psychosocial support, Splinting/orthotics, Museum/gallery curatortair training, Therapeutic Exercise, Therapeutic Activities, UE/LE Strength taining/ROM, UE/LE Coordination activities, Wheelchair propulsion/positioning  OT Interventions Warden/rangerBalance/vestibular training, FirefighterCommunity reintegration, Discharge planning, DME/adaptive equipment instruction, Functional mobility training, Neuromuscular re-education, Pain management, Patient/family education, Psychosocial support, Self Care/advanced ADL retraining, Skin care/wound managment, Therapeutic Activities, Therapeutic Exercise, UE/LE Strength taining/ROM, UE/LE Coordination activities, Visual/perceptual remediation/compensation  SLP Interventions Cueing hierarchy, Environmental controls, Functional tasks, Patient/family education, Internal/external aids, Speech/Language facilitation  TR Interventions    SW/CM Interventions Discharge Planning, Psychosocial Support, Patient/Family Education    Team Discharge Planning: Destination: PT-Home ,OT- Home , SLP-Home Projected Follow-up: PT-Home health PT, OT-  Home health OT, 24 hour supervision/assistance, SLP-24 hour supervision/assistance, Outpatient SLP Projected Equipment Needs: PT-To be determined, OT- 3 in 1 bedside comode, Tub/shower bench, To be determined, SLP-None recommended by SLP Equipment Details: PT-has no equipment, RW vs w/c TBD, OT-  Patient/family involved in discharge planning: PT- Patient, Family Adult nursemember/caregiver,  OT-Patient, Family member/caregiver, SLP-Patient, Family member/caregiver  MD ELOS: 14-17 days Medical Rehab Prognosis:  Good Assessment: 81 y.o. right handed female with history of  hypertension, atrial flutter maintained on aspirin, hypothyroidism, diabetes mellitus and peripheral neuropathy. By report patient uses a cane [place] had caregivers 5 hours a day for help of activities and ADLs. Presented  01/13/2015 from [facility] with left-sided weakness and slurred speech. MRI at outside hospital revealed right pontine CVA. Echocardiogram showed ejection fraction of 70% grade 1 diastolic dysfunction. Neurology follow-up and placed on Eliquis for CVA prophylaxis . Patient with unwitnessed fall on 01/18/2015 and found on floor with complaints of left index finger pain and no other trauma sustained. X-rays of left hand showed soft tissue swelling about the index finger without fracture.    See Team Conference Notes for weekly updates to the plan of care

## 2015-01-19 NOTE — Progress Notes (Signed)
Valerie Diones, RN Rehab Admission Coordinator Signed Physical Medicine and Rehabilitation PMR Pre-admission 01/18/2015 1:55 PM  Related encounter: Admission (Discharged) from 01/14/2015 in [place] Collapse All   PMR Admission Coordinator Pre-Admission Assessment  Patient: Valerie Pham is an 81 y.o., female MRN: [id number] DOB: 1927-11-08 Height: 5' 7"  (170.2 cm) Weight: 99 kg (218 lb 4.1 oz)  Insurance Information HMO: PPO: Yes PCP: IPA: 80/20: OTHER: Group #[id number] PRIMARY: UHC medicare Policy#: [id number] Subscriber: Valerie Pham CM Name: Valerie Pham Phone#: [phone] Fax#:  Pre-Cert#: [id number] with updates by onsite reviewer Employer: Retired Benefits: Phone #: [phone] Name: Automated Eff. Date: 01/29/14 Deduct: $0 Out of Pocket Max: $4000 (met [phone] Life Max: None CIR: $160 days 1-10 SNF: $0 days 1-20; $50 days 21-100 Outpatient: medical necessity Co-Pay: $20 Home Health: 100% Co-Pay: none DME: 80% Co-Pay: 20% Providers: in network  Emergency Contact Information Contact Information    Name Relation Home Work [place] Daughter [phone]  [phone]   Doristine Section [phone]  [phone]   Gilberto Better Daughter [phone]       Current Medical History  Patient Admitting Diagnosis: Right pontine CVA  History of Present Illness: A 81 y.o. right handed female with history of hypertension, hypothyroidism, diabetes mellitus and peripheral neuropathy. By report patient uses a cane and lives alone in [place] had caregivers 5 hours a day for help of activities and ADLs. Presented 01/13/2015 from [facility] with left-sided weakness  and slurred speech. Patient did not receive TPA. MRI at outside hospital revealed right pontine CVA. Echocardiogram showed ejection fraction of 54% grade 1 diastolic dysfunction. No cardiac source of emboli. MRA of head and neck without large vessel occlusion or stenosis. Neurology follow-up maintained on aspirin for CVA prophylaxis. Subcutaneous Lovenox for DVT prophylaxis. Tolerating a regular consistency diet. Urine study many bacteria and WBCs too numerous to count placed on empiric Cipro. Physical therapy evaluation completed 01/15/2015 with recommendations of physical medicine rehabilitation consult.   Total: 8=NIH  Past Medical History  Past Medical History  Diagnosis Date  . Benign essential HTN   . DM2 (diabetes mellitus, type 2) ([place])   . Hypothyroid   . Glaucoma     Family History  family history includes CAD in her father; Hypertension in her mother; Stroke in her mother.  Prior Rehab/Hospitalizations: No previous rehab  Has the patient had major surgery during 100 days prior to admission? No  Current Medications   Current facility-administered medications:  . apixaban (ELIQUIS) tablet 5 mg, 5 mg, Oral, Q12H, Modena Jansky, MD, 5 mg at 01/18/15 [phone] . atorvastatin (LIPITOR) tablet 20 mg, 20 mg, Oral, q1800, David L Rinehuls, PA-C, 20 mg at 01/17/15 1827 . ciprofloxacin (CIPRO) tablet 250 mg, 250 mg, Oral, BID, David L Rinehuls, PA-C, 250 mg at 01/18/15 0815 . dorzolamide-timolol (COSOPT) 22.3-6.8 MG/ML ophthalmic solution 1 drop, 1 drop, Both Eyes, BID, Dianne Dun, NP, 1 drop at 01/18/15 1002 . insulin aspart (novoLOG) injection 0-5 Units, 0-5 Units, Subcutaneous, QHS, Harvette C Jenkins, MD . insulin aspart (novoLOG) injection 0-9 Units, 0-9 Units, Subcutaneous, TID WC, Theressa Millard, MD, 2 Units at 01/18/15 1240 . levothyroxine (SYNTHROID, LEVOTHROID) tablet 75 mcg, 75 mcg, Oral, QAC breakfast, Modena Jansky, MD, 75 mcg at  01/18/15 0815 . metoprolol (LOPRESSOR) tablet 50 mg, 50 mg, Oral, BID, David L Rinehuls, PA-C, 50 mg at 01/18/15 1002 . senna-docusate (Senokot-S) tablet 1 tablet, 1 tablet,  Oral, QHS PRN, Theressa Millard, MD  Patients Current Diet: Diet heart healthy/carb modified Room service appropriate?: Yes; Fluid consistency:: Thin Diet - low sodium heart healthy Diet Carb Modified  Precautions / Restrictions Precautions Precautions: Fall Precaution Comments:  Restrictions Weight Bearing Restrictions: No   Has the patient had 2 or more falls or a fall with injury in the past year?No. However did have 1 fall in the hospital on 01/17/15  Prior Activity Level Community (5-7x/wk): Went out daily. [place] to [facility] for few hours a day.  Home Assistive Devices / Equipment Home Assistive Devices/Equipment: None  Prior Device Use: Indicate devices/aids used by the patient prior to current illness, exacerbation or injury? No device  Prior Functional Level Prior Function Level of Independence: Independent (had caregivers for 5 hours a day for help of activities ) Comments: has caregiver 2 days/week to assist with IADL tasks. [place] bus to [employer] 2 days/wk.  Self Care: Did the patient need help bathing, dressing, using the toilet or eating? Needed some help. Caregiver laid patient's clothes out, but patient likely could get her own clothes out PTA.  Indoor Mobility: Did the patient need assistance with walking from room to room (with or without device)? Independent  Stairs: Did the patient need assistance with internal or external stairs (with or without device)? Needed some help  Functional Cognition: Did the patient need help planning regular tasks such as shopping or remembering to take medications? Independent  Current Functional Level Cognition  Overall Cognitive Status: Impaired/Different from baseline Current Attention Level: Selective Orientation Level:  Oriented X4 Following Commands: Follows one step commands consistently Safety/Judgement: Decreased awareness of safety, Decreased awareness of deficits General Comments: mod A with transfer and pt thinks she can walk to bathroom alone Attention: Sustained Sustained Attention: Impaired Memory: Impaired Awareness: Impaired Problem Solving: Impaired Problem Solving Impairment: Functional basic Behaviors: Impulsive Safety/Judgment: Appears intact   Extremity Assessment (includes Sensation/Coordination)  Upper Extremity Assessment: LUE deficits/detail LUE Deficits / Details: isolated movement. generalized weakness. Able to complete @ 60 shoulder FF. gross grasp and release. minimal isolated finger movement. unable to complete hand to mouth with L hand. difficulty maintaingin grasp on object . Attempting to use during functional transfers. Poor positional awareness LUE at times.Daughter reposts LUE movement has improved since yesterday. LUE Sensation: decreased light touch LUE Coordination: decreased fine motor, decreased gross motor  Lower Extremity Assessment: LLE deficits/detail LLE Deficits / Details: weakness. isolated movemetn    ADLs  Overall ADL's : Needs assistance/impaired Eating/Feeding: Minimal assistance Grooming: Moderate assistance Upper Body Bathing: Moderate assistance Lower Body Bathing: Moderate assistance Upper Body Dressing : Moderate assistance Lower Body Dressing: Moderate assistance Lower Body Dressing Details (indicate cue type and reason): able to lean over to pull up socks BLE Toilet Transfer: Moderate assistance, Stand-pivot Toileting- Clothing Manipulation and Hygiene: Maximal assistance Toileting - Clothing Manipulation Details (indicate cue type and reason): incontinent urine Functional mobility during ADLs: Moderate assistance (+2 for ambulation) General ADL Comments: Poor insight/awareness into deficits. Daughter asking many quesitons regarding  rehab process and if her mother will be able to return to PLOF. Educaated daughter on rehab options.    Mobility  Overal bed mobility: Needs Assistance Bed Mobility: Supine to Sit Rolling: Min assist Sidelying to sit: Min assist Supine to sit: Mod assist General bed mobility comments: Assist to elevate trunk to full sitting position and to scoot around to the EOB. Bed pad used for assist.     Transfers  Overall transfer level: Needs assistance Equipment used: Rolling walker (2 wheeled) Transfers: Sit to/from Stand Sit to Stand: Min assist, +2 physical assistance, From elevated surface Stand pivot transfers: Min assist, +2 physical assistance General transfer comment: +2 for power-up and safety with transition to [facility]. VC's for hand placement on seated surface for safety.     Ambulation / Gait / Stairs / Wheelchair Mobility  Ambulation/Gait Ambulation/Gait assistance: Mod assist Ambulation Distance (Feet): 35 Feet Assistive device: Rolling walker (2 wheeled) Gait Pattern/deviations: Step-through pattern, Decreased stride length, Decreased weight shift to left, Decreased step length - left General Gait Details: VC's for improved posture, looking up, and increased step length on the L. Pt fatigued quickly but was able to sequence better with the RW today.  Gait velocity: Decreased Gait velocity interpretation: Below normal speed for age/gender    Posture / Balance Balance Overall balance assessment: Needs assistance Sitting-balance support: Feet supported, No upper extremity supported Sitting balance-Leahy Scale: Fair Postural control: Posterior lean Standing balance support: Bilateral upper extremity supported, During functional activity Standing balance-Leahy Scale: Poor    Special needs/care consideration BiPAP/CPAP No CPM No Continuous Drip IV No Dialysis No  Life Vest No Oxygen No Special Bed No Trach Size No Wound Vac (area) No  Skin Left hand  is swollen and right arm with bruising.  Bowel mgmt: Last BM 01/17/15 Bladder mgmt: Incontinent at times. Wears pull-ups at times at home. Diabetic mgmt Yes, on oral medications at home, but on insulin in hospital.    Previous Home Environment Living Arrangements: [place]: No  Discharge Living Setting Plans for Discharge Living Setting: Patient's home, Alone, House (Lives alone, but has a caregiver.) Type of Home at Discharge: House Discharge Home Layout: Two level, Able to live on main level with bedroom/bathroom Alternate Level Stairs-Number of Steps: Flight Discharge Home Access: Stairs to enter Entrance Stairs-Number of Steps: 2-3 steps side entry, front has about 10 steps Does the patient have any problems obtaining your medications?: No  Social/Family/Support Systems Patient Roles: Parent (Has 5 children, 4 are local.) Contact Information: Threasa Heads - daughter [phone] Anticipated Caregiver: Roberts Gaudy Anticipated Caregiver's Contact Information: Bethena Roys - caregiver (h) [phone] Ability/Limitations of Caregiver: Caregiver comes 2 hrs in am and 3 hrs in pm. Bethena Roys can increase her hours. Also other family will assist as needed. Caregiver Availability: Other (Comment) (Family will provide amount of care needed for patient.) Discharge Plan Discussed with Primary Caregiver: Yes Is Caregiver In Agreement with Plan?: Yes Does Caregiver/Family have Issues with Lodging/Transportation while Pt is in Rehab?: No  Goals/Additional Needs Patient/Family Goal for Rehab: PT/OT/ST mod I and supervision goals Expected length of stay: 12-14 days Cultural Considerations: [facility] Dietary Needs: Heart healthy, carb mod, thin liquids Equipment Needs: TBD Pt/Family Agrees to Admission and willing to participate: Yes Program Orientation Provided & Reviewed with Pt/Caregiver Including Roles & Responsibilities: Yes  Decrease  burden of Care through IP rehab admission: N/A  Possible need for SNF placement upon discharge: Not planned  Patient Condition: This patient's condition remains as documented in the consult dated 01/17/15, in which the Rehabilitation Physician determined and documented that the patient's condition is appropriate for intensive rehabilitative care in an inpatient rehabilitation facility. Will admit to inpatient rehab today.  Preadmission Screen Completed By: Valerie Pham, 01/18/2015 2:25 PM ______________________________________________________________________  Discussed status with Dr. Naaman Plummer on 01/18/15 at 1425 and received telephone approval for admission today.  Admission Coordinator: Valerie Pham, time1425/Date12/20/16  Cosigned by: Meredith Staggers, MD at 01/18/2015 3:30 PM  Revision History     Date/Time User Provider Type Action   01/18/2015 3:30 PM Meredith Staggers, MD Physician Cosign   01/18/2015 2:26 PM Valerie Diones, RN Rehab Admission Coordinator Sign

## 2015-01-19 NOTE — Evaluation (Signed)
Physical Therapy Assessment and Plan  Patient Details  Name: Valerie Pham MRN: [id number] Date of Birth: [birth year]  PT Diagnosis: Abnormal posture, Abnormality of gait, Cognitive deficits, Difficulty walking, Hemiparesis non-dominant and Muscle weakness Rehab Potential: Good ELOS: 14-17 days   Today's Date: 01/19/2015 PT Individual Time: 0900-1000 PT Individual Time Calculation (min): 60 min    Problem List:  Patient Active Problem List   Diagnosis Date Noted  . Gout attack 01/19/2015  . Bacterial UTI   . Generalized OA   . DM type 2 with diabetic peripheral neuropathy ([place])   . Hypomagnesemia   . Urinary tract infection, site not specified   . Hemiparesis affecting nondominant side as late effect of stroke ([place])   . CVA (cerebral vascular accident) ([place]) 01/14/2015  . Essential hypertension 01/14/2015  . DM type 2 (diabetes mellitus, type 2) ([place]) 01/14/2015  . Hypothyroidism 01/14/2015  . Right pontine cerebrovascular accident [facility]) 01/14/2015    Past Medical History:  Past Medical History  Diagnosis Date  . Benign essential HTN   . DM2 (diabetes mellitus, type 2) ([place])   . Hypothyroid   . Glaucoma    Past Surgical History:  Past Surgical History  Procedure Laterality Date  . Abdominal hysterectomy    . Tonsillectomy    . Appendectomy    . Cataract extraction, bilateral      Assessment & Plan Clinical Impression: Valerie Pham is a 81 y.o. right handed female with history of hypertension, hypothyroidism, diabetes mellitus and peripheral neuropathy. By report patient uses a cane and lives alone in [place] had caregivers 5 hours a day for help of activities and ADLs. [facility]/13/[facility] with left-sided weakness and slurred speech. Patient did not receive TPA. MRI at outside hospital revealed right pontine CVA. Echocardiogram showed ejection fraction of 20% grade 1 diastolic dysfunction. No cardiac source of emboli. MRA of head and neck  without large vessel occlusion or stenosis. Neurology follow-up maintained on aspirin for CVA prophylaxis. Subcutaneous Lovenox for DVT prophylaxis. Tolerating a regular consistency diet. Urine study many bacteria and WBCs too numerous to count placed on empiric Cipro. Physical therapy evaluation completed 01/15/2015 with recommendations of physical medicine rehabilitation consult. Patient transferred to CIR on 01/18/2015 .   Patient currently requires max with mobility secondary to muscle weakness, decreased cardiorespiratoy endurance, decreased coordination, decreased awareness, decreased problem solving, decreased safety awareness, decreased memory and delayed processing and decreased sitting balance, decreased standing balance, decreased postural control, hemiplegia and decreased balance strategies.  Prior to hospitalization, patient was modified independent  with mobility and lived with Alone in a House home.  Home access is 3Stairs to enter (son willing to build a ramp).  Patient will benefit from skilled PT intervention to maximize safe functional mobility, minimize fall risk and decrease caregiver burden for planned discharge home with 24 hour supervision.  Anticipate patient will benefit from follow up [place] at discharge.  PT - End of Session Activity Tolerance: Tolerates 30+ min activity with multiple rests Endurance Deficit: Yes PT Assessment Rehab Potential (ACUTE/IP ONLY): Good Barriers to Discharge: Inaccessible home environment;Decreased caregiver support Barriers to Discharge Comments: home alone PT Patient demonstrates impairments in the following area(s): Balance;Safety;Endurance;Motor PT Transfers Functional Problem(s): Bed Mobility;Bed to Chair;Furniture;Car PT Locomotion Functional Problem(s): Ambulation;Wheelchair Mobility;Stairs PT Plan PT Intensity: Minimum of 1-2 x/day ,45 to 90 minutes PT Frequency: 5 out of 7 days PT Duration Estimated Length of Stay: 14-17 days PT  Treatment/Interventions: Ambulation/gait training;Balance/vestibular training;Community reintegration;Discharge planning;Disease  management/prevention;DME/adaptive equipment instruction;Neuromuscular re-education;Functional mobility training;Patient/family education;Psychosocial support;Splinting/orthotics;Stair training;Therapeutic Exercise;Therapeutic Activities;UE/LE Strength taining/ROM;UE/LE Coordination activities;Wheelchair propulsion/positioning PT Transfers Anticipated Outcome(s): supervision PT Locomotion Anticipated Outcome(s): supervision with LRAD PT Recommendation Recommendations for Other Services: Neuropsych consult Follow Up Recommendations: Home health PT Patient destination: Home Equipment Recommended: To be determined Equipment Details: has no equipment, RW vs w/c TBD  Skilled Therapeutic Intervention Pt received supine in bed with daughter present, second daughter arriving later during session. C/o pain in LUE with movement however no pain at rest, and pt/daughter reporting pre-medicated which is helping. Performed initial PT evaluation. Assessed all mobility as described below and in care tool. Pt requires mod/maxA for mobility due to LE strength deficits, generalized fatigue. Pt and family educated in rehab process, estimated length of stay, anticipated goals and outcomes. Also advised family on importance of safety in hospital, use of call bell system and waiting for staff prior to getting up, yellow socks and quick release belt. Daughters report 3 STE home, however son able to build ramp if necessary. Pt remained seated in w/c at completion of session with all needs in reach; no quick release belt donned with daughter present.   PT Evaluation Precautions/Restrictions Precautions Precautions: Fall Restrictions Weight Bearing Restrictions: No General Chart Reviewed: Yes Response to Previous Treatment: Not applicable  Pain Pain Assessment Pain Assessment: No/denies  pain Pain Score: 6  Pain Type: Acute pain Pain Location: Hand Pain Orientation: Left Pain Descriptors / Indicators: Shooting Pain Onset: On-going Patients Stated Pain Goal: 0 Pain Intervention(s): Other (Comment) (Pre-medicated) Home Living/Prior Functioning Home Living Available Help at Discharge: Family;Personal care attendant;Available PRN/intermittently (family is working on 24/7) Type of Home: House Home Access: Stairs to enter (son willing to build a ramp) Technical brewer of Steps: 3 Entrance Stairs-Rails: Can reach both;Left;Right Home Layout: Two level;Able to live on main level with bedroom/bathroom Alternate Level Stairs-Number of Steps: Has not gone stairs in "years" Bathroom Shower/Tub: Tub/shower unit;Curtain (typically would take a bath) Bathroom Toilet: Handicapped height Bathroom Accessibility: Yes  Lives With: Alone Prior Function Level of Independence: Independent with transfers;Needs assistance with ADLs;Needs assistance with homemaking;Independent with gait (furniture walking)  Able to Take Stairs?: Yes (on/off bus) Driving: No Vocation: Retired Leisure: Hobbies-yes (Comment) (reading, playing bingo, word searches, going out to eat) Comments: Has caregiver 4 hours a day (2 hrs AM, 2 hrs PM), went to senior center during the afternoon Vision/Perception  Vision - Assessment Eye Alignment: Within Functional Limits Ocular Range of Motion: Within Functional Limits Alignment/Gaze Preference: Within Defined Limits Tracking/Visual Pursuits: Decreased smoothness of horizontal tracking (reports mild dizziness with vertical tracking) Saccades: Additional head turns occurred during testing Convergence: Within functional limits Additional Comments: Rt gaze preference, question decreased Lt visual attention  Cognition Overall Cognitive Status: Impaired/Different from baseline Orientation Level: Oriented to person;Oriented to situation;Oriented to place (did not  know year, reported 2004) Attention: Sustained Awareness: Impaired Problem Solving: Impaired Problem Solving Impairment: Functional basic Behaviors: Impulsive Safety/Judgment: Appears intact Sensation Sensation Light Touch: Appears Intact Stereognosis: Not tested Hot/Cold: Not tested Proprioception: Appears Intact Coordination Gross Motor Movements are Fluid and Coordinated: Yes Heel Shin Test: decreased excursion BLEs Motor  Motor Motor: Hemiplegia;Abnormal postural alignment and control Motor - Skilled Clinical Observations: generalized weakness, mild L hemiparesis, decreased postural control   Mobility Bed Mobility Bed Mobility: Rolling Right;Rolling Left;Supine to Sit Rolling Right: 5: Supervision;With rail Rolling Right Details: Verbal cues for sequencing;Verbal cues for technique Rolling Left: 5: Supervision;With rail Rolling Left Details: Visual cues/gestures for sequencing;Verbal cues for  technique Supine to Sit: 3: Mod assist Supine to Sit Details: Verbal cues for technique;Verbal cues for sequencing;Tactile cues for weight shifting;Tactile cues for placement Transfers Transfers: Yes Sit to Stand: 2: Max assist;From bed;With upper extremity assist Sit to Stand Details: Tactile cues for posture;Tactile cues for placement;Verbal cues for technique;Verbal cues for precautions/safety Stand Pivot Transfers: 3: Mod assist Stand Pivot Transfer Details: Visual cues for safe use of DME/AE;Verbal cues for sequencing;Verbal cues for technique;Verbal cues for precautions/safety;Manual facilitation for weight shifting;Manual facilitation for placement Locomotion  Ambulation Ambulation: Yes Ambulation/Gait Assistance: 3: Mod assist;1: +2 Total assist (w/c follow) Ambulation Distance (Feet): 5 Feet Assistive device: Rolling walker Ambulation/Gait Assistance Details: Verbal cues for sequencing;Verbal cues for technique;Tactile cues for posture;Tactile cues for weight  beaing Gait Gait: Yes Gait Pattern: Impaired Gait Pattern: Right flexed knee in stance;Left flexed knee in stance;Shuffle;Poor foot clearance - left;Poor foot clearance - right Gait velocity: decreased for age/gender norms Stairs / Additional Locomotion Stairs: Yes Stairs Assistance: 3: Mod assist Stairs Assistance Details: Verbal cues for technique;Verbal cues for precautions/safety;Verbal cues for sequencing;Manual facilitation for placement;Manual facilitation for weight shifting Stair Management Technique: Two rails;Step to pattern;Forwards;Backwards (forward ascent, backward descent) Number of Stairs: 1 Height of Stairs: 3 Wheelchair Mobility Wheelchair Mobility: Yes Wheelchair Assistance: 3: Mod assist Wheelchair Assistance Details: Verbal cues for technique;Visual cues/gestures for sequencing;Verbal cues for precautions/safety;Verbal cues for sequencing;Manual facilitation for placement Wheelchair Propulsion: Right upper extremity (BLEs unable to reach floor in w/c, LUE unable to A d/t pain) Wheelchair Parts Management: Needs assistance Distance: 50  Trunk/Postural Assessment  Cervical Assessment Cervical Assessment: Within Functional Limits Thoracic Assessment Thoracic Assessment: Exceptions to [facility] (thoracic kyphosis, rounded shoulders) Lumbar Assessment Lumbar Assessment: Exceptions to [facility] (reduced lumbar lordosis, posterior pelvic tilt) Postural Control Postural Control: Deficits on evaluation (decreased righting reactions, impaired reaching/stepping strategies to recover LOB)  Balance Balance Balance Assessed: Yes Static Sitting Balance Static Sitting - Balance Support: Right upper extremity supported Static Sitting - Level of Assistance: 5: Stand by assistance Dynamic Sitting Balance Dynamic Sitting - Balance Support: No upper extremity supported Dynamic Sitting - Level of Assistance: 4: Min assist Dynamic Sitting - Balance Activities: Reaching for objects;Forward  lean/weight shifting;Reaching across midline Static Standing Balance Static Standing - Balance Support: Bilateral upper extremity supported Static Standing - Level of Assistance: 4: Min assist Dynamic Standing Balance Dynamic Standing - Balance Support: Bilateral upper extremity supported;During functional activity Dynamic Standing - Level of Assistance: 3: Mod assist Extremity Assessment  RUE Assessment RUE Assessment: Within Functional Limits LUE Assessment LUE Assessment: Exceptions to WFL LUE AROM (degrees) Overall AROM Left Upper Extremity: Deficits LUE Overall AROM Comments: shoulder flexion approx 80 degrees, elbow and wrist WNL LUE PROM (degrees) LUE Overall PROM Comments: WFL LUE Strength LUE Overall Strength Comments: strength grossly 3/5, inconsistent with functional use and assessment RLE Assessment RLE Assessment: Within Functional Limits (grossly 4+/5 to 5/5 throughout) LLE Assessment LLE Assessment: Exceptions to [facility] (grossly 4/5 to 4+/5 throughout)   See Function Navigator for Current Functional Status.   Refer to Care Plan for Long Term Goals  Recommendations for other services: None  Discharge Criteria: Patient will be discharged from PT if patient refuses treatment 3 consecutive times without medical reason, if treatment goals not met, if there is a change in medical status, if patient makes no progress towards goals or if patient is discharged from hospital.  The above assessment, treatment plan, treatment alternatives and goals were discussed and mutually agreed upon: by patient and by family  Benjiman Core Tygielski 01/19/2015, 12:41 PM

## 2015-01-19 NOTE — Plan of Care (Signed)
Problem: RH SKIN INTEGRITY Goal: RH STG SKIN FREE OF INFECTION/BREAKDOWN Mod I  Outcome: Not Progressing No awareness of skin issues

## 2015-01-19 NOTE — Progress Notes (Signed)
Valerie Karis Juba, MD Physician Signed Physical Medicine and Rehabilitation Consult Note 01/17/2015 5:46 AM  Related encounter: Admission (Discharged) from 01/14/2015 in [facility] 55[facility] NEURO MEDICAL    Expand All Collapse All        Physical Medicine and Rehabilitation Consult Reason for Consult: Right CVA Referring Physician: [facility]   HPI: Valerie Pham is a 81 y.o. right handed female with history of hypertension, hypothyroidism, diabetes mellitus and peripheral neuropathy. By report patient uses a cane and lives alone in [place] had caregivers 5 hours a day for help of activities and ADLs. Presented 01/13/2015 from [facility] with left-sided weakness and slurred speech. Patient did not receive TPA. MRI at outside hospital revealed right pontine CVA. Echocardiogram showed ejection fraction of 70% grade 1 diastolic dysfunction. No cardiac source of emboli. MRA of head and neck without large vessel occlusion or stenosis. Neurology follow-up maintained on aspirin for CVA prophylaxis. Subcutaneous Lovenox for DVT prophylaxis. Tolerating a regular consistency diet. Urine study many bacteria and WBCs too numerous to count placed on empiric Cipro. Physical therapy evaluation completed 01/15/2015 with recommendations of physical medicine rehabilitation consult.  Review of Systems  Constitutional: Negative for fever and chills.  HENT: Negative for hearing loss.  Eyes: Negative for blurred vision and double vision.  Respiratory: Negative for cough and shortness of breath.  Cardiovascular: Negative for chest pain, palpitations and leg swelling.  Gastrointestinal: Positive for constipation. Negative for nausea and vomiting.  Genitourinary: Positive for urgency. Negative for dysuria and hematuria.  Musculoskeletal: Positive for myalgias, joint pain and falls.  Neurological: Positive for focal weakness. Negative for dizziness, seizures, loss of consciousness and headaches.    All other systems reviewed and are negative.  Past Medical History  Diagnosis Date  . Benign essential HTN   . DM2 (diabetes mellitus, type 2) (HCC)   . Hypothyroid   . Glaucoma    Past Surgical History  Procedure Laterality Date  . Abdominal hysterectomy    . Tonsillectomy    . Appendectomy    . Cataract extraction, bilateral     Family History  Problem Relation Age of Onset  . Stroke Mother   . Hypertension Mother   . CAD Father    Social History:  reports that she has never smoked. She does not have any smokeless tobacco history on file. She reports that she does not drink alcohol or use illicit drugs. Allergies:  Allergies  Allergen Reactions  . Penicillins Hives   Medications Prior to Admission  Medication Sig Dispense Refill  . amLODipine (NORVASC) 5 MG tablet Take 5 mg by mouth daily.    Marland Kitchen atorvastatin (LIPITOR) 20 MG tablet Take 20 mg by mouth daily.    . dorzolamide-timolol (COSOPT) 22.3-6.8 MG/ML ophthalmic solution Place 1 drop into both eyes 2 (two) times daily.    Marland Kitchen glipiZIDE (GLUCOTROL) 10 MG tablet Take 10 mg by mouth daily before breakfast.    . levothyroxine (SYNTHROID, LEVOTHROID) 75 MCG tablet Take 75 mcg by mouth daily before breakfast.    . losartan (COZAAR) 100 MG tablet Take 100 mg by mouth daily.    . metFORMIN (GLUCOPHAGE) 500 MG tablet Take 500 mg by mouth 2 (two) times daily with a meal.    . metoprolol (LOPRESSOR) 100 MG tablet Take 100 mg by mouth 2 (two) times daily.      Home: Home Living Family/patient expects to be discharged to:: Inpatient rehab Living Arrangements: Alone  Functional History: Prior Function Level  of Independence: Independent (had caregivers for 5 hours a day for help of activities ) Comments: has caregiver 2 days/week to assist with IADL tasks. 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 Functional Status:   Mobility: Bed Mobility Overal bed mobility: Needs Assistance Bed Mobility: Supine to Sit Supine to sit: Mod assist General bed mobility comments: min assist to get legs over and cue sequence, mod to lift under trunk Transfers Overall transfer level: Needs assistance Equipment used: 1 person hand held assist Transfers: Sit to/from Stand, Stand Pivot Transfers Sit to Stand: Mod assist, From elevated surface (to power up from each extremity) Stand pivot transfers: Mod assist, From elevated surface (with gait belt and PT) Ambulation/Gait Ambulation/Gait assistance: Mod assist, Min assist Ambulation Distance (Feet): 3 Feet Assistive device: 1 person hand held assist Gait Pattern/deviations: Step-to pattern, Leaning posteriorly, Ataxic, Shuffle, Wide base of support Gait velocity: reduced, halting Gait velocity interpretation: Below normal speed for age/gender    ADL: ADL Overall ADL's : Needs assistance/impaired Eating/Feeding: Minimal assistance Grooming: Moderate assistance Upper Body Bathing: Moderate assistance Lower Body Bathing: Moderate assistance Upper Body Dressing : Moderate assistance Lower Body Dressing: Moderate assistance Lower Body Dressing Details (indicate cue type and reason): able to lean over to pull up socks BLE Toilet Transfer: Moderate assistance, Stand-pivot Toileting- Clothing Manipulation and Hygiene: Maximal assistance Toileting - Clothing Manipulation Details (indicate cue type and reason): incontinent urine Functional mobility during ADLs: Moderate assistance (+2 for ambulation) General ADL Comments: Poor insight/awareness into deficits. Daughter asking many quesitons regarding rehab process and if her mother will be able to return to PLOF. Educaated daughter on rehab options.  Cognition: Cognition Overall Cognitive Status: Impaired/Different from baseline Orientation Level: Oriented to person, Oriented to time, Oriented to  situation Cognition Arousal/Alertness: Awake/alert Behavior During Therapy: Flat affect Overall Cognitive Status: Impaired/Different from baseline Area of Impairment: Attention, Memory, Following commands, Safety/judgement, Awareness, Problem solving, Orientation Orientation Level: Situation, Time, Place (not aware she had a CVA) Current Attention Level: Selective Memory: Decreased short-term memory, Decreased recall of precautions Following Commands: Follows one step commands consistently Safety/Judgement: Decreased awareness of safety, Decreased awareness of deficits Awareness: Emergent Problem Solving: Slow processing, Difficulty sequencing, Requires verbal cues, Requires tactile cues General Comments: mod A with transfer and pt thinks she can walk to bathroom alone  Blood pressure 171/73, pulse 73, temperature 98.4 F (36.9 C), temperature source Oral, resp. rate 18, height  (1.702 m), weight 99 kg (218 lb 4.1 oz), SpO2 96 %. Physical Exam  Vitals reviewed. Constitutional: She is oriented to person, place, and time. She appears well-developed and well-nourished.  HENT:  Head: Normocephalic and atraumatic.  Eyes: Conjunctivae and EOM are normal.  Neck: Normal range of motion. Neck supple. No thyromegaly present.  Cardiovascular: Normal rate and regular rhythm.  Murmur heard. Respiratory: Effort normal and breath sounds normal. No respiratory distress.  GI: Soft. Bowel sounds are normal. She exhibits no distension.  Musculoskeletal: She exhibits no edema or tenderness.  Neurological: She is alert and oriented to person, place, and time.  Follows commands.  Fair awareness of deficits Sensation intact to light touch Left hemifacial weakness 3+ DTR LUE Dysarthria Motor: LUE: Shoulder abduction 3/5, elbow flexion and extension 3+/5, finger grip 3+/5 LLE: Hip flexion 3+/5, ankle dorsi/plantar flexion 4 -/5 RUE/RLE: 5/5  Skin: Skin is warm and dry.  Psychiatric: She has a  normal mood and affect. Her behavior is normal.     Lab Results Last 24 Hours    Results  for orders placed or performed during the hospital encounter of 01/14/15 (from the past 24 hour(s))  Glucose, capillary Status: Abnormal   Collection Time: 01/16/15 6:31 AM  Result Value Ref Range   Glucose-Capillary 147 (H) 65 - 99 mg/dL   Comment 1 Notify RN    Comment 2 Document in Chart   Glucose, capillary Status: Abnormal   Collection Time: 01/16/15 11:45 AM  Result Value Ref Range   Glucose-Capillary 173 (H) 65 - 99 mg/dL  Glucose, capillary Status: Abnormal   Collection Time: 01/16/15 4:25 PM  Result Value Ref Range   Glucose-Capillary 190 (H) 65 - 99 mg/dL  Glucose, capillary Status: Abnormal   Collection Time: 01/16/15 9:32 PM  Result Value Ref Range   Glucose-Capillary 195 (H) 65 - 99 mg/dL   Comment 1 Notify RN    Comment 2 Document in Chart       Imaging Results (Last 48 hours)    No results found.    Assessment/Plan: Diagnosis: right pontine CVA Labs independently reviewed. Records reviewed and summated above. Stroke: Continue secondary stroke prophylaxis and Risk Factor Modification listed below:  Antiplatelet therapy Blood Pressure Management: Continue current medication with prn's with permisive HTN per primary team Statin Agent Diabetes management Motor recovery: Fluoxetine  1. Does the need for close, 24 hr/day medical supervision in concert with the patient's rehab needs make it unreasonable for this patient to be served in a less intensive setting? Yes  2. Co-Morbidities requiring supervision/potential complications: HTN (monitor and provide prns in accordance with increased physical exertion and pain), hypothyroidism (ventricular mood and energy do not limit therapies, continue meds), diabetes mellitus and peripheral neuropathy (Monitor in accordance with exercise and adjust meds  as necessary), OA (ensure pain does not limit therapies), hypomagnesemia (continue to monitor and replete as necessary), recent UTI (Continue abx) 3. Due to bladder management, safety, disease management, medication administration and patient education, does the patient require 24 hr/day rehab nursing? Yes 4. Does the patient require coordinated care of a physician, rehab nurse, PT (1-2 hrs/day, 5 days/week), OT (1-2 hrs/day, 5 days/week) and SLP (1-2 hrs/day, 5 days/week) to address physical and functional deficits in the context of the above medical diagnosis(es)? Yes Addressing deficits in the following areas: balance, endurance, locomotion, strength, transferring, bowel/bladder control, bathing, dressing, toileting, speech and psychosocial support 5. Can the patient actively participate in an intensive therapy program of at least 3 hrs of therapy per day at least 5 days per week? Yes 6. The potential for patient to make measurable gains while on inpatient rehab is good 7. Anticipated functional outcomes upon discharge from inpatient rehab are modified independent and supervision with PT, modified independent with OT, modified independent and supervision with SLP. 8. Estimated rehab length of stay to reach the above functional goals is: 12-14 days. 9. Does the patient have adequate social supports and living environment to accommodate these discharge functional goals? Yes 10. Anticipated D/C setting: Home 11. Anticipated post D/C treatments: HH therapy and Home excercise program 12. Overall Rehab/Functional Prognosis: good  RECOMMENDATIONS: This patient's condition is appropriate for continued rehabilitative care in the following setting: CIR Patient has agreed to participate in recommended program. Yes Note that insurance prior authorization may be required for reimbursement for recommended care.  Comment: Rehab Admissions Coordinator to follow up.  Maryla MorrowAnkit Patel, MD 01/17/2015

## 2015-01-19 NOTE — Progress Notes (Signed)
[facility]ICINE [facility] RE[facility]ABILITATION     PROGRESS NOTE    Subjective/Complaints:  Patient seen this morning resting comfortably in bed. She notes swelling and edema the second digit of her left hand.  ROS: + Pain and edema secondary to left upper extremity. Denies CP, SOB, N/V/D  Objective: Vital Signs: Blood pressure 170/69, pulse 68, temperature 98.7 F (37.1 C), temperature source Oral, resp. rate 18, weight 95.6 kg (210 lb 12.2 oz), SpO2 97 %. Dg Hand Complete Left  01/18/2015  CLINICAL DATA:  Patient with redness and swelling to the left index and left little fingers. EXAM: LEFT HAND - COMPLETE 3+ VIEW COMPARISON:  None. FINDINGS: Degenerative changes of the DIP joints as well as the first [facility] joint. Osseous demineralization. No evidence for acute fracture. Soft tissue swelling about the index finger. No underlying osseous erosion. IMPRESSION: Soft tissue swelling about the index finger concerning for acute infectious process. Degenerative changes as above. No acute osseous abnormality. Electronically Signed   By: Annia Belt M.D.   On: 01/18/2015 12:27    Recent Labs  01/19/15 0630  WBC 7.3  HGB 12.9  HCT 40.0  PLT 194    Recent Labs  01/18/15 0540 01/19/15 0630  NA 139 138  K 3.8 3.6  CL 105 105  GLUCOSE 288* 226*  BUN 25* 27*  CREATININE 0.88 0.83  CALCIUM 8.7* 8.5*   CBG (last 3)   Recent Labs  01/18/15 1632 01/18/15 2142 01/19/15 0710  GLUCAP 192* 194* 200*    Wt Readings from Last 3 Encounters:  01/18/15 95.6 kg (210 lb 12.2 oz)  01/14/15 99 kg (218 lb 4.1 oz)    Physical Exam:  BP 170/69 mmHg  Pulse 68  Temp(Src) 98.7 F (37.1 C) (Oral)  Resp 18  Wt 95.6 kg (210 lb 12.2 oz)  SpO2 97% Constitutional: She appears well-developed and well-nourished. Vital signs reviewed HENT: Normocephalic and atraumatic. Conjunctivae and EOM are normal.  Neck: Normal range of motion. No thyromegaly present.  Cardiovascular: Normal rate and  regular rhythm.  Systolic Murmur heard. Respiratory: Effort normal and breath sounds normal. No respiratory distress.  GI: Soft. Bowel sounds are normal. She exhibits no distension.  Musculoskeletal: + Edema and TTP over second digit of left upper extremity  Neurological: She is alert and oriented.  Follows commands.  Fair awareness of deficits Left hemifacial weakness, speech dysarthric Motor: LUE: Shoulder abduction 3/5, elbow flexion and extension 3+/5, finger grip 3/5 and somewhat limited by pain.  LLE: Hip flexion 3+/5, , ke 3+, ankle dorsi/plantar flexion 4 -/5 RUE/RLE: 5/5 prox to distal.  Skin: Skin is warm and dry.  Psychiatric: She has a normal mood and affect. Her behavior is normal. Reasonable insight and awareness  Assessment/Plan: 1. 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 Physiatrist is providing close team supervision and 24 hour management of active medical problems listed below. Physiatrist and rehab team continue to assess barriers to discharge/monitor patient progress toward functional and medical goals.  Function:  Bathing Bathing position      Bathing parts      Bathing assist        Upper Body Dressing/Undressing Upper body dressing                    Upper body assist  Lower Body Dressing/Undressing Lower body dressing                                  Lower body assist        Toileting Toileting          Toileting assist     Transfers Chair/bed transfer             Locomotion Ambulation           Wheelchair          Cognition Comprehension Comprehension assist level: Understands basic 50 - 74% of the time/ requires cueing 25 - 49% of the time  Expression Expression assist level: Expresses basic 75 - 89% of the time/requires cueing 10  - 24% of the time. Needs helper to occlude trach/needs to repeat words.  Social Interaction Social Interaction assist level: Interacts appropriately 90% of the time - Needs monitoring or encouragement for participation or interaction.  Problem Solving Problem solving assist level: Solves basic 50 - 74% of the time/requires cueing 25 - 49% of the time  Memory Memory assist level: Recognizes or recalls 50 - 74% of the time/requires cueing 25 - 49% of the time    Medical Problem List and Plan: 1.Left side weakness with gait instability secondary to right pontine infarct felt to be embolic  2.DVT Prophylaxis/Anticoagulation: Eliquis 3. Pain Management: Tylenol as needed 4. Hypertension. Lopressor 50 mg twice a day. Monitor with increased mobility  Will gradually restart home meds.  Will start Amlodipine 2.5 today and increase to 5.  5. Neuropsych: This patient is capable of making decisions on her own behalf. 6. Skin/Wound Care: Routine skin checks 7. Fluids/Electrolytes/Nutrition: Routine I&O   CMP on 12/21 within acceptable limits 8. Diabetes mellitus with peripheral neuropathy. Hemoglobin A1c 7.6. Check blood sugars before meals and at bedtime.   Patient on Glucotrol 10 mg daily and glucophage 500 mg twice daily at home. Restarted her medications on 12/21  Diabetic teaching 9. Hyperlipidemia. Lipitor 10. Hypothyroidism. Synthroid 11. UTI. Urine culture multi-species. Awaiting recollection of urine. Cipro discontinued. 12. Gout: Pt with hx and swelling of 2nd digit LUE.  Xray reviewed from 12/20.   Ultrasound, Uric acid ordered to further evaluate gout versus infection  Will follow results and management accordingly   LOS (Days) 1 A FACE TO FACE EVALUATION WAS PERFORMED  Brie Eppard Karis Jubanil Chemika [place] 01/19/2015 8:10 AM

## 2015-01-19 NOTE — Discharge Instructions (Addendum)
Inpatient Rehab Discharge Instructions  Valerie Pham Discharge date and time: No discharge date for patient encounter.   Activities/Precautions/ Functional Status: Activity: activity as tolerated Diet: diabetic diet Wound Care: none needed Functional status:  ___ No restrictions     ___ Walk up steps independently ___ 24/7 supervision/assistance   ___ Walk up steps with assistance ___ Intermittent supervision/assistance  ___ Bathe/dress independently ___ Walk with walker     ___ Bathe/dress with assistance ___ Walk Independently    ___ Shower independently _x STROKE/TIA DISCHARGE INSTRUCTIONS SMOKING Cigarette smoking nearly doubles your risk of having a stroke & is the single most alterable risk factor  If you smoke or have smoked in the last 12 months, you are advised to quit smoking for your health.  Most of the excess cardiovascular risk related to smoking disappears within a year of stopping.  Ask you doctor about anti-smoking medications  [place] Quit Line: 1-800-QUIT NOW  Free Smoking Cessation Classes [phone]  CHOLESTEROL Know your levels; limit fat & cholesterol in your diet  Lipid Panel     Component Value Date/Time   CHOL 134 01/15/2015 0139   TRIG 136 01/15/2015 0139   HDL 35* 01/15/2015 0139   CHOLHDL 3.8 01/15/2015 0139   VLDL 27 01/15/2015 0139   LDLCALC 72 01/15/2015 0139      Many patients benefit from treatment even if their cholesterol is at goal.  Goal: Total Cholesterol (CHOL) less than 160  Goal:  Triglycerides (TRIG) less than 150  Goal:  HDL greater than 40  Goal:  LDL (LDLCALC) less than 100   BLOOD PRESSURE American Stroke Association blood pressure target is less that 120/80 mm/Hg  Your discharge blood pressure is:  BP: (!) 170/69 mmHg  Monitor your blood pressure  Limit your salt and alcohol intake  Many individuals will require more than one medication for high blood pressure  DIABETES (A1c is a blood sugar average for last 3  months) Goal HGBA1c is under 7% (HBGA1c is blood sugar average for last 3 months)  Diabetes:     Lab Results  Component Value Date   HGBA1C 7.6* 01/15/2015     Your HGBA1c can be lowered with medications, healthy diet, and exercise.  Check your blood sugar as directed by your physician  Call your physician if you experience unexplained or low blood sugars.  PHYSICAL ACTIVITY/REHABILITATION Goal is 30 minutes at least 4 days per week  Activity: Increase activity slowly, Therapies: Physical Therapy: Home Health Return to work:   Activity decreases your risk of heart attack and stroke and makes your heart stronger.  It helps control your weight and blood pressure; helps you relax and can improve your mood.  Participate in a regular exercise program.  Talk with your doctor about the best form of exercise for you (dancing, walking, swimming, cycling).  DIET/WEIGHT Goal is to maintain a healthy weight  Your discharge diet is: Diet heart healthy/carb modified Room service appropriate?: Yes; Fluid consistency:: Thin  liquids Your height is:    Your current weight is: Weight: 95.6 kg (210 lb 12.2 oz) Your Body Mass Index (BMI) is:     Following the type of diet specifically designed for you will help prevent another stroke.  Your goal weight range is:    Your goal Body Mass Index (BMI) is 19-24.  Healthy food habits can help reduce 3 risk factors for stroke:  High cholesterol, hypertension, and excess weight.  RESOURCES Stroke/Support Group:  Call [phone]  STROKE EDUCATION PROVIDED/REVIEWED AND GIVEN TO PATIENT Stroke warning signs and symptoms How to activate emergency medical system (call 911). Medications prescribed at discharge. Need for follow-up after discharge. Personal risk factors for stroke. Pneumonia vaccine given:  Flu vaccine given:  My questions have been answered, the writing is legible, and I understand these instructions.  I will adhere to these goals &  educational materials that have been provided to me after my discharge from the hospital.    __ Walk with assistance    ___ Shower with assistance ___ No alcohol     ___ Return to work/school ________  COMMUNITY REFERRALS UPON DISCHARGE:   Home Health:   PT    OT     RN  Agency:  [facility] Phone:  [phone] Medical Equipment/Items Ordered: 20"x18" lightweight wheelchair with basic cushion; rolling walker; tub bench; bedside commode  Agency/Supplier:  [facility]   Phone:  [phone](336) [phone]  GENERAL COMMUNITY RESOURCES FOR PATIENT/FAMILY: Support Groups:  [facility]                              2nd Thursday of the month at [facility]3PM                               In the dayroom of [facility]:  Drs. McDermott and Wylene SimmerPollard, PsyD saw you in the hospital and recommend follow up in 8-12 months for follow up testing                                [facility], [facility]                                [address]                                [place], [place]  [id number]                                [phone]               Special Instructions:    My questions have been answered and I understand these instructions. I will adhere to these goals and the provided educational materials after my discharge from the hospital.  Patient/Caregiver Signature _______________________________ Date __________  Clinician Signature _______________________________________ Date __________  Please bring this form and your medication list with you to all your follow-up doctor's appointments.  Information on my medicine - ELIQUIS (apixaban)  This medication education was reviewed with me or my healthcare representative as part of my discharge preparation.  The pharmacist that spoke with me during my hospital stay was:  Elwin Sleightowell, Lisa Kay, [facility]  Why was Eliquis prescribed for you? Eliquis was prescribed for you  to reduce the risk of a blood clot forming that can cause a stroke if you have a medical condition called atrial fibrillation (a type of irregular heartbeat).  What do You need to know about Eliquis ? Take your Eliquis TWICE DAILY - one tablet in the morning and one tablet in the evening with or  without food. If you have difficulty swallowing the tablet whole please discuss with your pharmacist how to take the medication safely.  Take Eliquis exactly as prescribed by your doctor and DO NOT stop taking Eliquis without talking to the doctor who prescribed the medication.  Stopping may increase your risk of developing a stroke.  Refill your prescription before you run out.  After discharge, you should have regular check-up appointments with your healthcare provider that is prescribing your Eliquis.  In the future your dose may need to be changed if your kidney function or weight changes by a significant amount or as you get older.  What do you do if you miss a dose? If you miss a dose, take it as soon as you remember on the same day and resume taking twice daily.  Do not take more than one dose of ELIQUIS at the same time to make up a missed dose.  Important Safety Information A possible side effect of Eliquis is bleeding. You should call your healthcare provider right away if you experience any of the following: ? Bleeding from an injury or your nose that does not stop. ? Unusual colored urine (red or dark brown) or unusual colored stools (red or black). ? Unusual bruising for unknown reasons. ? A serious fall or if you hit your head (even if there is no bleeding).  Some medicines may interact with Eliquis and might increase your risk of bleeding or clotting while on Eliquis. To help avoid this, consult your healthcare provider or pharmacist prior to using any new prescription or non-prescription medications, including herbals, vitamins, non-steroidal anti-inflammatory drugs (NSAIDs) and  supplements.  This website has more information on Eliquis (apixaban): http://www.eliquis.com/eliquis/home

## 2015-01-19 NOTE — Evaluation (Signed)
Occupational Therapy Assessment and Plan  Patient Details  Name: Valerie Pham MRN: [id number] Date of Birth: 1927-05-26  OT Diagnosis: hemiplegia affecting non-dominant side and muscle weakness (generalized) Rehab Potential: Rehab Potential (ACUTE ONLY): Good ELOS: 14-17 days   Today's Date: 01/19/2015 OT Individual Time: 1035-1200 OT Individual Time Calculation (min): 85 min     Problem List:  Patient Active Problem List   Diagnosis Date Noted  . Gout attack 01/19/2015  . Bacterial UTI   . Generalized OA   . DM type 2 with diabetic peripheral neuropathy ([place])   . Hypomagnesemia   . Urinary tract infection, site not specified   . Hemiparesis affecting nondominant side as late effect of stroke ([place])   . CVA (cerebral vascular accident) ([place]) 01/14/2015  . Essential hypertension 01/14/2015  . DM type 2 (diabetes mellitus, type 2) ([place]) 01/14/2015  . Hypothyroidism 01/14/2015  . Right pontine cerebrovascular accident [facility]) 01/14/2015    Past Medical History:  Past Medical History  Diagnosis Date  . Benign essential HTN   . DM2 (diabetes mellitus, type 2) ([place])   . Hypothyroid   . Glaucoma    Past Surgical History:  Past Surgical History  Procedure Laterality Date  . Abdominal hysterectomy    . Tonsillectomy    . Appendectomy    . Cataract extraction, bilateral      Assessment & Plan Clinical Impression: Patient is a 81 y.o. right handed female with history of hypertension, atrial flutter maintained on aspirin, hypothyroidism, diabetes mellitus and peripheral neuropathy. By report patient uses a cane and lives alone in [place] had caregivers 5 hours a day for help of activities and ADLs. Presented [facility]/13/2015 from [facility] with left-sided weakness and slurred speech. Patient did not receive TPA. MRI at outside hospital revealed right pontine CVA. Echocardiogram showed ejection fraction of 78% grade 1 diastolic dysfunction. No cardiac source of emboli. MRA of  head and neck without large vessel occlusion or stenosis. Neurology follow-up and placed on Eliquis for CVA prophylaxis . Tolerating a regular consistency diet. Urine study with negative nitrite cultures multiple species initially on Cipro and discontinued. Patient with unwitnessed fall on 01/18/2015 and found on floor with complaints of left index finger pain and no other trauma sustained. X-rays of left hand showed soft tissue swelling about the index finger without fracture. Physical and occupational therapy evaluations completed 01/15/2015 with recommendations of physical medicine rehabilitation consult.  Patient transferred to CIR on 01/18/2015 .    Patient currently requires total with basic self-care skills secondary to muscle weakness, impaired timing and sequencing, abnormal tone, unbalanced muscle activation and decreased coordination, decreased visual perceptual skills, Rt gaze preference and decreased standing balance, decreased postural control and decreased balance strategies.  Prior to hospitalization, patient could complete ADLs with supervision/setup.  Patient will benefit from skilled intervention to decrease level of assist with basic self-care skills prior to discharge home with care partner.  Anticipate patient will require 24 hour supervision and follow up home health.  OT - End of Session Activity Tolerance: Tolerates 30+ min activity with multiple rests Endurance Deficit: Yes Endurance Deficit Description: falling asleep towards end of session OT Assessment Rehab Potential (ACUTE ONLY): Good Barriers to Discharge: Decreased caregiver support Barriers to Discharge Comments: family is trying to work out 24 hour assist OT Patient demonstrates impairments in the following area(s): Balance;Cognition;Endurance;Motor;Pain;Perception;Safety;Vision OT Basic ADL's Functional Problem(s): Eating;Grooming;Bathing;Dressing;Toileting OT Transfers Functional Problem(s): Toilet;Tub/Shower OT  Additional Impairment(s): Fuctional Use of Upper Extremity OT Plan OT  Intensity: Minimum of 1-2 x/day, 45 to 90 minutes OT Frequency: 5 out of 7 days OT Duration/Estimated Length of Stay: 14-17 days OT Treatment/Interventions: Medical illustrator training;Community reintegration;Discharge planning;DME/adaptive equipment instruction;Functional mobility training;Neuromuscular re-education;Pain management;Patient/family education;Psychosocial support;Self Care/advanced ADL retraining;Skin care/wound managment;Therapeutic Activities;Therapeutic Exercise;UE/LE Strength taining/ROM;UE/LE Coordination activities;Visual/perceptual remediation/compensation OT Self Feeding Anticipated Outcome(s): Supervision/setup OT Basic Self-Care Anticipated Outcome(s): Supervision OT Toileting Anticipated Outcome(s): Supervision OT Bathroom Transfers Anticipated Outcome(s): Supervision OT Recommendation Patient destination: Home Follow Up Recommendations: Home health OT;24 hour supervision/assistance Equipment Recommended: 3 in 1 bedside comode;Tub/shower bench;To be determined   Skilled Therapeutic Intervention OT eval completed with discussion of rehab process, OT purpose, POC, goals, and ELOS.  ADL assessment completed at sit > stand level at sink with pt requiring total assist for LB dressing and pt only able to maintain standing < 1 minute even with UE support when standing at sink.  Simulated tub/shower transfer with pt requiring mod assist transfer and assist to lift both legs over tub ledge with use of tub transfer bench.  Pt falling asleep during session, reports tired for PT session.    OT Evaluation Precautions/Restrictions  Precautions Precautions: Fall Pain Pain Assessment Pain Assessment: No/denies pain Home Living/Prior Functioning Home Living Available Help at Discharge: Family, Personal care attendant, Available PRN/intermittently Type of Home: House Home Access: Stairs to enter (son willing  to build a ramp) Technical brewer of Steps: 3 Entrance Stairs-Rails: Can reach both, Left, Right Home Layout: Two level, Able to live on main level with bedroom/bathroom Alternate Level Stairs-Number of Steps: Has not gone stairs in "years" Bathroom Shower/Tub: Tub/shower unit, Curtain (typically would take a bath) Bathroom Toilet: Handicapped height Bathroom Accessibility: Yes  Lives With: Alone IADL History Homemaking Responsibilities: Yes Meal Prep Responsibility: Secondary Laundry Responsibility: No Cleaning Responsibility: No Bill Paying/Finance Responsibility: Secondary Shopping Responsibility: No Child Care Responsibility: No Current License: No Education: Therapist, art  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 Prior Function Level of Independence: Independent with transfers, Needs assistance with ADLs, Needs assistance with homemaking, Independent with gait (furniture walking)  Able to Take Stairs?: Yes (on/off bus) Driving: No Vocation: Retired Leisure: Hobbies-yes (Comment) (reading, playing bingo, word searches, going out to eat) Comments: Has caregiver 4 hours a day (2 hrs AM, 2 hrs PM), went to senior center during the afternoon ADL   Vision/Perception  Vision- History Baseline Vision/History: Wears glasses;Macular Degeneration;Cataracts (had cataract surgery) Wears Glasses: At all times Patient Visual Report: No change from baseline Vision- Assessment Vision Assessment?: Yes Eye Alignment: Within Functional Limits Ocular Range of Motion: Within Functional Limits Alignment/Gaze Preference: Within Defined Limits Tracking/Visual Pursuits: Decreased smoothness of horizontal tracking (reports mild dizziness with vertical tracking) Saccades: Additional head turns occurred during testing Convergence: Within functional limits Visual Fields: No apparent deficits Additional Comments: Rt gaze preference, question  impaired Lt visual attention  Cognition Overall Cognitive Status: History of cognitive impairments - at baseline Arousal/Alertness: Awake/alert Orientation Level: Person;Place (reports in hospital due to a fall, did not recall stroke until further discussion) Year: 2016 Month: December Day of Week: Incorrect Memory: Impaired Memory Impairment: Decreased short term memory Decreased Short Term Memory: Verbal complex Immediate Memory Recall: Sock;Blue;Bed Memory Recall: Sock;Blue;Bed Memory Recall Sock: Without Cue Memory Recall Blue: Without Cue Memory Recall Bed: Without Cue Attention: Selective Selective Attention: Appears intact Awareness: Impaired Awareness Impairment: Intellectual impairment Problem Solving: Impaired Problem Solving Impairment: Functional basic Behaviors: Impulsive Safety/Judgment: Impaired Comments: pt had fall on another unit prior to transfer to CIR  Sensation Sensation Light Touch: Appears Intact Stereognosis: Not tested Hot/Cold: Not tested Proprioception: Appears Intact Coordination Gross Motor Movements are Fluid and Coordinated: Yes Fine Motor Movements are Fluid and Coordinated: No Finger Nose Finger Test: decreased on Lt due to hemiplegia Heel Shin Test: decreased excursion BLEs Motor  Motor Motor: Hemiplegia;Abnormal postural alignment and control Motor - Skilled Clinical Observations: generalized weakness, mild L hemiparesis, decreased postural control  Mobility  Bed Mobility Bed Mobility: Rolling Right;Rolling Left;Supine to Sit Rolling Right: 5: Supervision;With rail Rolling Right Details: Verbal cues for sequencing;Verbal cues for technique Rolling Left: 5: Supervision;With rail Rolling Left Details: Visual cues/gestures for sequencing;Verbal cues for technique Supine to Sit: 3: Mod assist Supine to Sit Details: Verbal cues for technique;Verbal cues for sequencing;Tactile cues for weight shifting;Tactile cues for  placement Transfers Sit to Stand: 2: Max assist;From bed;With upper extremity assist Sit to Stand Details: Tactile cues for posture;Tactile cues for placement;Verbal cues for technique;Verbal cues for precautions/safety  Trunk/Postural Assessment  Cervical Assessment Cervical Assessment: Within Functional Limits Thoracic Assessment Thoracic Assessment: Exceptions to [facility] (thoracic kyphosis, rounded shoulders) Lumbar Assessment Lumbar Assessment: Exceptions to [facility] (reduced lumbar lordosis, posterior pelvic tilt) Postural Control Postural Control: Deficits on evaluation (decreased righting reactions, impaired reaching/stepping strategies to recover LOB)  Balance Balance Balance Assessed: Yes Static Sitting Balance Static Sitting - Balance Support: Right upper extremity supported Static Sitting - Level of Assistance: 5: Stand by assistance Dynamic Sitting Balance Dynamic Sitting - Balance Support: No upper extremity supported Dynamic Sitting - Level of Assistance: 4: Min assist Dynamic Sitting - Balance Activities: Reaching for objects;Forward lean/weight shifting;Reaching across midline Static Standing Balance Static Standing - Balance Support: Bilateral upper extremity supported Static Standing - Level of Assistance: 4: Min assist Dynamic Standing Balance Dynamic Standing - Balance Support: Bilateral upper extremity supported;During functional activity Dynamic Standing - Level of Assistance: 3: Mod assist Extremity/Trunk Assessment RUE Assessment RUE Assessment: Within Functional Limits LUE Assessment LUE Assessment: Exceptions to WFL LUE AROM (degrees) Overall AROM Left Upper Extremity: Deficits LUE Overall AROM Comments: shoulder flexion approx 80 degrees, elbow and wrist WNL LUE PROM (degrees) LUE Overall PROM Comments: WFL LUE Strength LUE Overall Strength Comments: strength grossly 3/5, inconsistent with functional use and assessment   See Function Navigator for Current  Functional Status.   Refer to Care Plan for Long Term Goals  Recommendations for other services: None  Discharge Criteria: Patient will be discharged from OT if patient refuses treatment 3 consecutive times without medical reason, if treatment goals not met, if there is a change in medical status, if patient makes no progress towards goals or if patient is discharged from hospital.  The above assessment, treatment plan, treatment alternatives and goals were discussed and mutually agreed upon: by patient and by family  Ellwood Dense [facility] 01/19/2015, 4:10 PM

## 2015-01-19 NOTE — Plan of Care (Signed)
Problem: RH SAFETY Goal: RH STG ADHERE TO SAFETY PRECAUTIONS W/ASSISTANCE/DEVICE STG Adhere to Safety Precautions With Assistance/Device. Supervision  Outcome: Not Progressing Patient requires max assist

## 2015-01-20 ENCOUNTER — Inpatient Hospital Stay (HOSPITAL_COMMUNITY): Payer: Medicare Other | Admitting: Occupational Therapy

## 2015-01-20 ENCOUNTER — Inpatient Hospital Stay (HOSPITAL_COMMUNITY): Payer: Medicare Other | Admitting: Speech Pathology

## 2015-01-20 ENCOUNTER — Encounter (HOSPITAL_COMMUNITY): Payer: Medicare Other

## 2015-01-20 ENCOUNTER — Inpatient Hospital Stay (HOSPITAL_COMMUNITY): Payer: Medicare Other | Admitting: Physical Therapy

## 2015-01-20 DIAGNOSIS — M19049 Primary osteoarthritis, unspecified hand: Secondary | ICD-10-CM | POA: Insufficient documentation

## 2015-01-20 DIAGNOSIS — M19042 Primary osteoarthritis, left hand: Secondary | ICD-10-CM

## 2015-01-20 LAB — GLUCOSE, CAPILLARY
GLUCOSE-CAPILLARY: 156 mg/dL — AB (ref 65–99)
GLUCOSE-CAPILLARY: 151 mg/dL — AB (ref 65–99)
GLUCOSE-CAPILLARY: 129 mg/dL — AB (ref 65–99)
Glucose-Capillary: 134 mg/dL — ABNORMAL HIGH (ref 65–99)

## 2015-01-20 MED ORDER — AMLODIPINE BESYLATE 5 MG PO TABS: 5.0000 mg | ORAL_TABLET | Freq: Every day | ORAL | Status: DC

## 2015-01-20 MED ORDER — COLCHICINE 0.6 MG PO TABS
1.2000 mg | ORAL_TABLET | Freq: Once | ORAL | Status: AC
  Administered 2015-01-20: 1.2 mg via ORAL
  Filled 2015-01-20: qty 2

## 2015-01-20 MED ORDER — COLCHICINE 0.6 MG PO TABS
0.6000 mg | ORAL_TABLET | Freq: Every day | ORAL | Status: AC
  Administered 2015-01-20 – 2015-01-26 (×7): 0.6 mg via ORAL
  Filled 2015-01-20 (×6): qty 1

## 2015-01-20 MED ORDER — AMLODIPINE BESYLATE 2.5 MG PO TABS: 2.5000 mg | ORAL_TABLET | Freq: Once | ORAL | Status: DC

## 2015-01-20 MED ORDER — AMLODIPINE BESYLATE 5 MG PO TABS
5.0000 mg | ORAL_TABLET | Freq: Every day | ORAL | Status: DC
  Administered 2015-01-20 – 2015-01-26 (×7): 5 mg via ORAL
  Filled 2015-01-20 (×7): qty 1

## 2015-01-20 NOTE — Progress Notes (Signed)
Occupational Therapy Session Note  Patient Details  Name: Valerie Pham MRN: [id number] Date of Birth: 12-11-1927  Today's Date: 01/20/2015 OT Individual Time: 1035-1200 OT Individual Time Calculation (min): 85 min    Short Term Goals: Week 1:  OT Short Term Goal 1 (Week 1): Pt will complete LB dressing with max assist OT Short Term Goal 2 (Week 1): Pt will complete bathing with mod assist OT Short Term Goal 3 (Week 1): Pt will complete toilet transfers with min assist OT Short Term Goal 4 (Week 1): Pt will complete 2 grooming tasks in standing with min assist  Skilled Therapeutic Interventions/Progress Updates:    Treatment session with focus on LB dressing, functional use of LUE, dynamic sitting balance, and trunk control.  Pt declined bathing this session but willing to change shirts, don bra, and attempt donning socks and shoes.  Pt required assist with fastening bra in front and was unable to reach forward and don socks this session, pt reports mostly due to difficulty with grasp secondary to gout.  Completed 9 hole peg test with Rt: 43 seconds and Lt: only placing 1 peg in 2 minutes.  Modified task to use with large pegs with pt able to place and remove with increased time, however demonstrating increased control and manipulation.  Increased challenge to using medium sized pegs with coban wrapping to provide increased grip with pt able to place and remove 9 in 3:16.  Utilized mirror infront of pt to provide visual input on Rt lean in unsupported sitting with pt requiring initial tactile cues to correct sitting balance and then able to use mirror to correct.  Engaged in reaching activity with RUE to reach across midline to promote weight shifting to Lt.  Squat pivot transfer back to w/c with mod assist and cues to lift buttocks.  Encouraged use of LUE as gross assist with all self-feeding and family positioned to pt's Lt to promote Lt weight shift and attention.  Therapy  Documentation Precautions:  Precautions Precautions: Fall Restrictions Weight Bearing Restrictions: No General:   Vital Signs: Therapy Vitals BP: (!) 154/80 mmHg Pain:  Pt with no c/o pain  See Function Navigator for Current Functional Status.   Therapy/Group: Individual Therapy  Rosalio LoudHOXIE, Jayveon Convey 01/20/2015, 12:26 PM

## 2015-01-20 NOTE — Progress Notes (Signed)
Speech Language Pathology Daily Session Note  Patient Details  Name: Valerie Pham MRN: [id number] Date of Birth: Sep 22, 1927  Today's Date: 01/20/2015 SLP Individual Time: 0909-1005 SLP Individual Time Calculation (min): 56 min  Short Term Goals: Week 1: SLP Short Term Goal 1 (Week 1): Pt will achieve intelligibility in conversations with supervision cues over 3 targeted sessions.   SLP Short Term Goal 2 (Week 1): Pt will utilize compensatory dysarthria strategies in conversations wtih supervision cues.    Skilled Therapeutic Interventions:  Pt was seen for skilled ST targeting communication.  SLP facilitated the session with a structured barrier task targeting speech intelligibility.  Pt produced minimally distinct word pairs intelligibly in 100% of opportunities.  Pt required min assist verbal cues for intelligibility during a structured verbal picture description task due to rapid rate of speech and imprecise articulation of consonants.  Intelligibility and self regulation of verbal errors improved during loosely structured conversations with SLP and pt was able to achieve intelligibility at the sentence and conversation level with supervision verbal cues.  SLP provided skilled education regarding compensatory intelligibility strategies.  Handout was provided and posted in pt's room to maximize carryover of skills in between therapy sessions.  Continue per current plan of care.    Function:  Eating Eating                 Cognition Comprehension Comprehension assist level: Understands basic 90% of the time/cues < 10% of the time  Expression   Expression assist level: Expresses basic 75 - 89% of the time/requires cueing 10 - 24% of the time. Needs helper to occlude trach/needs to repeat words.  Social Interaction Social Interaction assist level: Interacts appropriately 90% of the time - Needs monitoring or encouragement for participation or interaction.  Problem Solving Problem  solving assist level: Solves basic 75 - 89% of the time/requires cueing 10 - 24% of the time  Memory Memory assist level: Recognizes or recalls 50 - 74% of the time/requires cueing 25 - 49% of the time    Pain Pain Assessment Pain Assessment: No/denies pain Pain Score: 0-No pain  Therapy/Group: Individual Therapy  Zondra Lawlor, Melanee SpryNicole L 01/20/2015, 6:05 PM

## 2015-01-20 NOTE — Progress Notes (Signed)
[place] PHYSICAL MEDICINE & REHABILITATION     PROGRESS NOTE    Subjective/Complaints:  Patient seen this AM sitting up in her recliner with another daughter present.  Daughter has several questions regarding acute events and rehab plan.   ROS: + Pain and edema secondary to left upper extremity. Denies CP, SOB, N/V/D  Objective: Vital Signs: Blood pressure 179/78, pulse 64, temperature 98.3 F (36.8 C), temperature source Oral, resp. rate 16, weight 95.6 kg (210 lb 12.2 oz), SpO2 98 %. [place] Extrem Up Left Ltd  01/20/2015  CLINICAL DATA:  Left hand index finger and small finger inflammation. Possible gout EXAM: ULTRASOUND LEFT UPPER EXTREMITY LIMITED TECHNIQUE: Ultrasound examination of the upper extremity soft tissues was performed in the area of clinical concern. COMPARISON:  Radiographs 01/18/2015. FINDINGS: Evaluation was limited to the index and small fingers. No focal fluid collections are identified. No large joint effusion, synovial thickening or echogenic periarticular foci identified to suggest gouty tophi. Radiographs demonstrate osteoarthritic changes in the index and small fingers. IMPRESSION: No focal fluid collections or specific signs of gout identified within the index or small fingers. Electronically Signed   By: Carey Bullocks M.D.   On: 01/20/2015 08:13   Dg Hand Complete Left  01/18/2015  CLINICAL DATA:  Patient with redness and swelling to the left index and left little fingers. EXAM: LEFT HAND - COMPLETE 3+ VIEW COMPARISON:  None. FINDINGS: Degenerative changes of the DIP joints as well as the first [facility] joint. Osseous demineralization. No evidence for acute fracture. Soft tissue swelling about the index finger. No underlying osseous erosion. IMPRESSION: Soft tissue swelling about the index finger concerning for acute infectious process. Degenerative changes as above. No acute osseous abnormality. Electronically Signed   By: Annia Belt M.D.   On: 01/18/2015 12:27     Recent Labs  01/19/15 0630  WBC 7.3  HGB 12.9  HCT 40.0  PLT 194    Recent Labs  01/18/15 0540 01/19/15 0630  NA 139 138  K 3.8 3.6  CL 105 105  GLUCOSE 288* 226*  BUN 25* 27*  CREATININE 0.88 0.83  CALCIUM 8.7* 8.5*   CBG (last 3)   Recent Labs  01/19/15 1702 01/19/15 2106 01/20/15 0646  GLUCAP 139* 175* 156*    Wt Readings from Last 3 Encounters:  01/18/15 95.6 kg (210 lb 12.2 oz)  01/14/15 99 kg (218 lb 4.1 oz)    Physical Exam:  BP 179/78 mmHg  Pulse 64  Temp(Src) 98.3 F (36.8 C) (Oral)  Resp 16  Wt 95.6 kg (210 lb 12.2 oz)  SpO2 98% Constitutional: She appears well-developed and well-nourished. Vital signs reviewed HENT: Normocephalic and atraumatic. Conjunctivae and EOM are normal.  Neck: Normal range of motion. No thyromegaly present.  Cardiovascular: Normal rate and regular rhythm.  Systolic Murmur heard. Respiratory: Effort normal and breath sounds normal. No respiratory distress.  GI: Soft. Bowel sounds are normal. She exhibits no distension.  Musculoskeletal: + Edema and TTP over second digit of left upper extremity  Neurological: She is alert and oriented.  Follows commands.  Fair awareness of deficits Left hemifacial weakness, speech dysarthric Motor: LUE: Shoulder abduction 3/5, elbow flexion and extension 3+/5, finger grip 3/5  limited by pain.  LLE: Hip flexion 3/5, , ke 3+, ankle dorsi/plantar flexion 3+-/5 RUE/RLE: 5/5 prox to distal.  Skin: Skin is warm and dry.  Psychiatric: She has a normal mood and affect. Her behavior is normal. Reasonable insight and awareness  Assessment/Plan: 1.  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 Physiatrist is providing close team supervision and 24 hour management of active medical problems listed below. Physiatrist and rehab  team continue to assess barriers to discharge/monitor patient progress toward functional and medical goals.  Function:  Bathing Bathing position   Position: Wheelchair/chair at sink  Bathing parts Body parts bathed by patient: Left arm, Chest, Abdomen Body parts bathed by helper: Buttocks, Back, Right arm  Bathing assist Assist Level:  (Max assist)      Upper Body Dressing/Undressing Upper body dressing   What is the patient wearing?: Pull over shirt/dress     Pull over shirt/dress - Perfomed by patient: Thread/unthread right sleeve, Put head through opening Pull over shirt/dress - Perfomed by helper: Thread/unthread left sleeve, Pull shirt over trunk        Upper body assist Assist Level:  (Mod assist)      Lower Body Dressing/Undressing Lower body dressing   What is the patient wearing?: Pants       Pants- Performed by helper: Thread/unthread right pants leg, Thread/unthread left pants leg, Pull pants up/down                      Lower body assist Assist for lower body dressing:  (Total assist)      Toileting Toileting          Toileting assist     Transfers Chair/bed transfer   Chair/bed transfer method: Stand pivot Chair/bed transfer assist level: 2 helpers Chair/bed transfer assistive device: Mechanical lift Mechanical lift: Landscape architecttedy   Locomotion Ambulation     Max distance: 5 Assist level: 2 helpers (modA with w/c follow)   Wheelchair   Type: Manual Max wheelchair distance: 50 Assist Level: Moderate assistance (Pt 50 - 74%)  Cognition Comprehension Comprehension assist level: Understands basic 90% of the time/cues < 10% of the time  Expression Expression assist level: Expresses basic 75 - 89% of the time/requires cueing 10 - 24% of the time. Needs helper to occlude trach/needs to repeat words.  Social Interaction Social Interaction assist level: Interacts appropriately 90% of the time - Needs monitoring or encouragement for participation or  interaction.  Problem Solving Problem solving assist level: Solves basic 75 - 89% of the time/requires cueing 10 - 24% of the time  Memory Memory assist level: Recognizes or recalls 50 - 74% of the time/requires cueing 25 - 49% of the time    Medical Problem List and Plan: 1.Left side weakness with gait instability secondary to right pontine infarct felt to be embolic  2.DVT Prophylaxis/Anticoagulation: Eliquis 3. Pain Management: Tylenol as needed 4. Hypertension. Lopressor 50 mg twice a day. Monitor with increased mobility  Will gradually restart home meds.    Will increase Amlodipine to home 5mg  today.  5. Neuropsych: This patient is capable of making decisions on her own behalf. 6. Skin/Wound Care: Routine skin checks 7. Fluids/Electrolytes/Nutrition: Routine I&O   CMP on 12/21 within acceptable limits 8. Diabetes mellitus with peripheral neuropathy. Hemoglobin A1c 7.6. Check blood sugars before meals and at bedtime.   Patient on Glucotrol 10 mg daily and glucophage 500 mg twice daily at home.  Restarted her medications on 12/21, will cont to monitor and make further adjustments if necessary by next week  Diabetic teaching 9. Hyperlipidemia. Lipitor 10. Hypothyroidism. Synthroid 11. UTI. Urine culture multi-species.  Cipro discontinued.  Repeat Ucx pending.  12. Gout: Pt with hx and swelling of 2nd digit LUE.  Xray reviewed from 12/20.   Ultrasound reviewed, Uric acid elevated  Will start treatment for gout   LOS (Days) 2 A FACE TO FACE EVALUATION WAS PERFORMED  [name] 01/20/2015 8:17 AM

## 2015-01-20 NOTE — Patient Care Conference (Signed)
Inpatient RehabilitationTeam Conference and Plan of Care Update Date: 01/19/2015   Time: 1:30 PM    Patient Name: Valerie Pham      Medical Record Number: [id number]  Date of Birth: Mar 02, 1927 Sex: Female         Room/Bed: 4M02C/4M02C-01 Payor Info: Payor: Advertising copywriter MEDICARE / Plan: [employer] MEDICARE / Product Type: *No Product type* /    Admitting Diagnosis: R CVA  Admit Date/Time:  01/18/2015  7:29 PM Admission Comments: No comment available   Primary Diagnosis:  Right pontine cerebrovascular accident [facility]) Principal Problem: Right pontine cerebrovascular accident [facility])  Patient Active Problem List   Diagnosis Date Noted  . Inflammation of joint of finger   . Gout attack 01/19/2015  . Bacterial UTI   . Generalized OA   . DM type 2 with diabetic peripheral neuropathy (HCC)   . Hypomagnesemia   . Urinary tract infection, site not specified   . Hemiparesis affecting nondominant side as late effect of stroke (HCC)   . CVA (cerebral vascular accident) (HCC) 01/14/2015  . Essential hypertension 01/14/2015  . DM type 2 (diabetes mellitus, type 2) (HCC) 01/14/2015  . Hypothyroidism 01/14/2015  . Right pontine cerebrovascular accident [facility]) 01/14/2015    Expected Discharge Date:    Team Members Present: Physician leading conference: Dr. Maryla Morrow Social Worker Present: Staci Acosta, LCSW Nurse Present: Chana Bode, RN PT Present: Alyson Reedy, PT OT Present: Rosalio Loud, Marye Round, OT SLP Present: Jackalyn Lombard, SLP PPS Coordinator present : Tora Duck, RN, CRRN     Current Status/Progress Goal Weekly Team Focus  Medical   Left side weakness with gait instability secondary to right pontine infarct felt to be embolic  Improve BP, DM, pain in left 2nd digit  see above   Bowel/Bladder   incontinent attempt toileting every 2 hours  LBM 12-19 continent   manage B&B  educate on bowel and bladder management   Swallow/Nutrition/ Hydration   Pending ST  evaluation          ADL's   mod assist overall  supervision overall  ADL retraining, sit > stand, standing balance, functional transfers, LUE NMR   Mobility   ModA bed mobility, transfers, w/c propulsion, 1 stair with B handrails, +2A gait with w/c follow for 5'  S bed mobility, transfers, gait with LRAD, minA stairs  activity tolerance, safety awareness, general strengthening, LLE NMR   Communication   Pending ST evaluation          Safety/Cognition/ Behavioral Observations  Pending ST evaluation          Pain   left hand index finger and "pinky finger" tylenol 650 mg po every 4 hours prn  less than 3  monitor effectiveness of tylenol   Skin   n/a          Rehab Goals Patient on target to meet rehab goals: Yes Rehab Goals Revised: none as this is pt's first conference *See Care Plan and progress notes for long and short-term goals.  Barriers to Discharge: Elevated BP, hyperglycemia, pain in finger, ?UTI    Possible Resolutions to Barriers:  Optimize Bp meds, DM meds, workup for etiology of finger pain and edema, follow urine studies    Discharge Planning/Teaching Needs:  Pt's d/c plan is still to be determined by her family.  Pt has a paid caregiver 5 hours/day, but therapists are recommending 24/7 supervision at this point.  Family trying to figure out if they can provide that or  if pt needs another d/c plan.  They would like to see pt's progress after being on CIR for a week before they make final decisions.  Pt's dtrs are here regularly and they and paid caregiver can participate in family education, as needed.   Team Discussion:  Pt is just being evaluated today and she has significant fatigue, so therapists would like to see how pt does over the week, but they anticipate her being able to reach supervision level goals with occasional minimal assistance.  Pt walked 5' with PT an did one stair.  BP is running high and pt has been incontinent of urine with frequency.  MD is  checking UA again.  Anticipated LOS is 14-17 days at this time.  Revisions to Treatment Plan:  none   Continued Need for Acute Rehabilitation Level of Care: The patient requires daily medical management by a physician with specialized training in physical medicine and rehabilitation for the following conditions: Daily direction of a multidisciplinary physical rehabilitation program to ensure safe treatment while eliciting the highest outcome that is of practical value to the patient.: Yes Daily medical management of patient stability for increased activity during participation in an intensive rehabilitation regime.: Yes Daily analysis of laboratory values and/or radiology reports with any subsequent need for medication adjustment of medical intervention for : Neurological problems;Other  Tyshun Tuckerman, Vista DeckJennifer Capps 01/20/2015, 11:11 AM

## 2015-01-20 NOTE — Progress Notes (Signed)
Inpatient Rehabilitation Center Individual Statement of Services  Patient Name:  Valerie Pham  Date:  01/20/2015  Welcome to the Inpatient Rehabilitation Center.  Our goal is to provide you with an individualized program based on your diagnosis and situation, designed to meet your specific needs.  With this comprehensive rehabilitation program, you will be expected to participate in at least 3 hours of rehabilitation therapies Monday-Friday, with modified therapy programming on the weekends.  Your rehabilitation program will include the following services:  Physical Therapy (PT), Occupational Therapy (OT), Speech Therapy (ST), 24 hour per day rehabilitation nursing, Neuropsychology, Case Management (Social Worker), Rehabilitation Medicine, Nutrition Services and Pharmacy Services  Weekly team conferences will be held on Wednesdays to discuss your progress.  Your Social Worker will talk with you frequently to get your input and to update you on team discussions.  Team conferences with you and your family in attendance may also be held.  Expected length of stay:  14 to 17 days  Overall anticipated outcome:  Supervision with minimal assistance for stairs  Depending on your progress and recovery, your program may change. Your Social Worker will coordinate services and will keep you informed of any changes. Your Social Worker's name and contact numbers are listed  below.  The following services may also be recommended but are not provided by the Inpatient Rehabilitation Center:   Driving Evaluations  Home Health Rehabiltiation Services  Outpatient Rehabilitation Services   Arrangements will be made to provide these services after discharge if needed.  Arrangements include referral to agencies that provide these services.  Your insurance has been verified to be:  [employer] Your primary doctor is:  Dr. Lois HuxleyJames Davis  Pertinent information will be shared with your doctor and  your insurance company.  Social Worker:  Staci AcostaJenny Arel Tippen, LCSW  [phone] or ([phone]) [phone]  Information discussed with and copy given to patient by: Elvera LennoxPrevatt, Isiaih Hollenbach Capps, 01/20/2015, 11:41 AM

## 2015-01-20 NOTE — Progress Notes (Signed)
Social Work Patient ID: Doran Durand, female   DOB: May 26, 1927, 81 y.o.   MRN: [id number]   CSW met with pt and her dtr, Earnest Bailey, to update them on team conference discussion.  Pt was asleep during most of the visit.  CSW explained that the team was not ready to set a d/c date yet due to the fact that not everyone had met pt and because pt is so fatigued that they want to give her a chance to show therapists what she can do prior to them setting a date.  Dtr is concerned about pt's gout flare in her left hand.  CSW told her that CSW would f/u with medical team to make sure they are aware.  CSW spoke with Marlowe Shores, PA and they are aware of it and are waiting to see pt's uric acid levels and then MD will act accordingly.  Team will set a d/c date at next conference on 01-26-15.  Dtr expressed understanding and family will make decisions after goals are set and d/c date is known.  CSW will continue to follow and assist as needed.

## 2015-01-20 NOTE — Progress Notes (Signed)
Physical Therapy Session Note  Patient Details  Name: Valerie Pham MRN: [id number] Date of Birth: April 15, 1927  Today's Date: 01/20/2015 PT Individual Time: 1300-1400 PT Individual Time Calculation (min): 60 min   Short Term Goals: Week 1:  PT Short Term Goal 1 (Week 1): Pt will perfor bed mobility S and min verbal cues PT Short Term Goal 2 (Week 1): Pt will perform sit <>stand with consistent minA PT Short Term Goal 3 (Week 1): Pt will perform stand pivot transfer close S with RW PT Short Term Goal 4 (Week 1): Pt will perform gait x50' with LRAD and modA PT Short Term Goal 5 (Week 1): Pt will perform ascent/descent of 8 3-inch stairs with modA  Skilled Therapeutic Interventions/Progress Updates:    Pt received seated in w/c with daughter and caregiver present; no c/o pain and agreeable to treatment. Reports needing to use restroom; stand pivot modA to/from w/c using grab bars. TotalA for donning/doffing pants and brief. Noted incontinent bowel movement and required brief change, totalA for hygiene. Gait with RW x10' modA before pt fatigued. Talked with daughter and caregiver about PLOF; reports pt would walk through grocery store with UEs on cart. Gait with weighted grocery cart with min/modA x40'. Sit <>stand x5 trials with standing balance while performing BUE reaching overhead to match cards. Min verbal cues for visual scanning to locate matching card, min guard for standing balance. Pt returned to room, transferred w/c >bed minA with RW. Sit>supine minA to A with LLE into bed. Remained supine in bed with all needs in reach at completion of session.   Therapy Documentation Precautions:  Precautions Precautions: Fall Restrictions Weight Bearing Restrictions: No Pain: Pain Assessment Pain Assessment: No/denies pain Pain Score: 0-No pain   See Function Navigator for Current Functional Status.   Therapy/Group: Individual Therapy  Vista Lawmanlizabeth J Tygielski 01/20/2015, 2:30 PM

## 2015-01-21 ENCOUNTER — Inpatient Hospital Stay (HOSPITAL_COMMUNITY): Payer: Medicare Other | Admitting: Physical Therapy

## 2015-01-21 ENCOUNTER — Inpatient Hospital Stay (HOSPITAL_COMMUNITY): Payer: Medicare Other | Admitting: Speech Pathology

## 2015-01-21 ENCOUNTER — Inpatient Hospital Stay (HOSPITAL_COMMUNITY): Payer: Medicare Other | Admitting: Occupational Therapy

## 2015-01-21 LAB — URINE CULTURE: Culture: >=100000

## 2015-01-21 LAB — GLUCOSE, CAPILLARY
GLUCOSE-CAPILLARY: 152 mg/dL — AB (ref 65–99)
Glucose-Capillary: 155 mg/dL — ABNORMAL HIGH (ref 65–99)
Glucose-Capillary: 118 mg/dL — ABNORMAL HIGH (ref 65–99)
Glucose-Capillary: 166 mg/dL — ABNORMAL HIGH (ref 65–99)

## 2015-01-21 MED ORDER — LOSARTAN POTASSIUM 25 MG PO TABS
25.0000 mg | ORAL_TABLET | Freq: Every day | ORAL | Status: DC
  Administered 2015-01-21: 25 mg via ORAL
  Filled 2015-01-21: qty 1

## 2015-01-21 NOTE — Progress Notes (Signed)
Patient noted with malodorous urine . Color dark yellow incontinent at times. Encouraged increase in fluids. Awaiting pending UA culture results. Cipriano MileP. Love, PA aware. Continue with plan of care.  Cleotilde NeerJoyce, Darwin Rothlisberger S

## 2015-01-21 NOTE — Progress Notes (Signed)
Speech Language Pathology Daily Session Note  Patient Details  Name: Valerie Pham H Godbolt MRN: [id number] Date of Birth: 1927-07-21  Today's Date: 01/21/2015 SLP Individual Time: [id number] SLP Individual Time Calculation (min): 40 min  Short Term Goals: Week 1: SLP Short Term Goal 1 (Week 1): Pt will achieve intelligibility in conversations with supervision cues over 3 targeted sessions.   SLP Short Term Goal 2 (Week 1): Pt will utilize compensatory dysarthria strategies in conversations wtih supervision cues.    Skilled Therapeutic Interventions:  Pt was seen for skilled ST targeting communication goals.  SLP facilitated the session with a structured verbal description task targeting intelligibility in conversations.  Pt was ~80% intelligible in a noisy, distracting environment but improved to >90% intelligible with min assist verbal cues for overarticulation and slow rate.  Pt was returned to room and left in wheelchair with daughters present at bedside.  Continue per current plan of care.    Function:  Eating Eating                 Cognition Comprehension Comprehension assist level: Understands basic 90% of the time/cues < 10% of the time  Expression   Expression assist level: Expresses basic 75 - 89% of the time/requires cueing 10 - 24% of the time. Needs helper to occlude trach/needs to repeat words.  Social Interaction Social Interaction assist level: Interacts appropriately 90% of the time - Needs monitoring or encouragement for participation or interaction.  Problem Solving Problem solving assist level: Solves basic 75 - 89% of the time/requires cueing 10 - 24% of the time  Memory Memory assist level: Recognizes or recalls 50 - 74% of the time/requires cueing 25 - 49% of the time    Pain Pain Assessment Pain Assessment: No/denies pain  Therapy/Group: Individual Therapy  Kingstyn Deruiter, Melanee SpryNicole L 01/21/2015, 4:30 PM

## 2015-01-21 NOTE — Progress Notes (Signed)
Physical Therapy Session Note  Patient Details  Name: Valerie MangoSara H Crisafulli MRN: [id number] Date of Birth: 1927/12/15  Today's Date: 01/21/2015 PT Individual Time: 0800-0930 PT Individual Time Calculation (min): 90 min   Short Term Goals: Week 1:  PT Short Term Goal 1 (Week 1): Pt will perfor bed mobility S and min verbal cues PT Short Term Goal 2 (Week 1): Pt will perform sit <>stand with consistent minA PT Short Term Goal 3 (Week 1): Pt will perform stand pivot transfer close S with RW PT Short Term Goal 4 (Week 1): Pt will perform gait x50' with LRAD and modA PT Short Term Goal 5 (Week 1): Pt will perform ascent/descent of 8 3-inch stairs with modA  Skilled Therapeutic Interventions/Progress Updates:    Pt received supine in bed, asleep but easily aroused to lights/sounds. No c/o pain and agreeable to treatment. Supine>sit with HOB elevated and bedrails with S and increased time. Seated on EOB, performed upper and lower body dressing with minA. Requires assistance for pulling shirt over trunk, donning shoes and R sock. TEDs donned totalA. Sit <>stand for A with donning pants; noted incontinent bowel movement. Hygiene and changing brief performed totalA with pt standing BUE support on RW. Requires several seated rest breaks while changing pants and brief due to fatigue. Squat pivot transfer bed>w/c min guard and verbal cues for hand placement. W/c propulsion x50' with minA using R hemi technique, verbal cues for long push with RUE and steering with RLE. Squat pivot transfer w/c <>mat table min guard. Seated and supine LE strengthening exercises for 2 sets 10-15 each, and pt given handout for performance outside of regularly scheduled therapy sessions. Exercises included seated hip flexion marching, long arc quad, hip adduction isometric, glute sets, heel slides, straight leg raise (R only), bridging. Min verbal cues for slowing performance, especially with isometric holds and long arc quad. Encouraged pt  to perform with a family member present. Gait with RW x15' to Nustep with min A, and sit >stand min guard. Nustep x7 min with BUE/BLE on level 2 with average speed 20 steps/min. Pt requests to use restroom; returned to room totalA. Stand pivot w/c <>toilet with grab bars and minA. Therapist performed donning/doffing brief and pants, pt performed hygiene. Pt remained seated in w/c with all needs in reach at completion of session.  Therapy Documentation Precautions:  Precautions Precautions: Fall Restrictions Weight Bearing Restrictions: No Pain: Pain Assessment Patients Stated Pain Goal: 2   See Function Navigator for Current Functional Status.   Therapy/Group: Individual Therapy  Vista Lawmanlizabeth J Tygielski 01/21/2015, 10:49 AM

## 2015-01-21 NOTE — Progress Notes (Signed)
Occupational Therapy Session Note  Patient Details  Name: Valerie Pham MRN: [id number] Date of Birth: 08/18/1927  Today's Date: 01/21/2015 OT Individual Time: [id number] OT Individual Time Calculation (min): 60 min    Short Term Goals: Week 1:  OT Short Term Goal 1 (Week 1): Pt will complete LB dressing with max assist OT Short Term Goal 2 (Week 1): Pt will complete bathing with mod assist OT Short Term Goal 3 (Week 1): Pt will complete toilet transfers with min assist OT Short Term Goal 4 (Week 1): Pt will complete 2 grooming tasks in standing with min assist  Skilled Therapeutic Interventions/Progress Updates:    Treatment session with focus on trunk control, sit > stand, and Lt attention.  Pt declined bathing and dressing this session.  Completed squat pivot transfer to therapy mat with min assist and tactile cues for anterior weight shift.  Utilized Ship brokermirror with focus on visual input for weight shifting to Lt, as pt tends to sit with Rt bias.  Engaged in reaching activity in sitting to encourage weight shift onto Lt buttock, progressing to sit > stand with reaching to Lt with LUE with mirror providing visual input for midline orientation.  Engaged in reaching across midline with RUE to further promote weight shifting to Lt and trunk control with weight shifting.  Increased challenge with reaching with LUE and then crossing midline with pt demonstrating difficulty with shoulder flexion and sustaining position to obtain items on Lt.  Pt required multiple rest breaks throughout standing task.  Engaged in word search activity in sitting, during rest breaks, with focus on scanning to Lt and weight shifting to Lt.  Therapy Documentation Precautions:  Precautions Precautions: Fall Restrictions Weight Bearing Restrictions: No General:   Vital Signs: Therapy Vitals Temp: 98.2 F (36.8 C) Temp Source: Oral Pulse Rate: 60 Resp: 18 BP: (!) 149/69 mmHg Patient Position (if appropriate):  Sitting Oxygen Therapy SpO2: 96 % O2 Device: Not Delivered Pain:  Pt with no c/o pain  See Function Navigator for Current Functional Status.   Therapy/Group: Individual Therapy  Rosalio LoudHOXIE, Norlene Lanes 01/21/2015, 3:50 PM

## 2015-01-21 NOTE — Progress Notes (Signed)
Social Work Assessment and Plan  Patient Details  Name: Valerie Pham MRN: [id number] Date of Birth: [birth year]  Today's Date: 01/21/2015  Problem List:  Patient Active Problem List   Diagnosis Date Noted  . Inflammation of joint of finger   . Gout attack 01/19/2015  . Bacterial UTI   . Generalized OA   . DM type 2 with diabetic peripheral neuropathy ([place])   . Hypomagnesemia   . Urinary tract infection, site not specified   . Hemiparesis affecting nondominant side as late effect of stroke ([place])   . CVA (cerebral vascular accident) ([place]) 01/14/2015  . Essential hypertension 01/14/2015  . DM type 2 (diabetes mellitus, type 2) ([place]) 01/14/2015  . Hypothyroidism 01/14/2015  . Right pontine cerebrovascular accident [facility]) 01/14/2015   Past Medical History:  Past Medical History  Diagnosis Date  . Benign essential HTN   . DM2 (diabetes mellitus, type 2) ([place])   . Hypothyroid   . Glaucoma    Past Surgical History:  Past Surgical History  Procedure Laterality Date  . Abdominal hysterectomy    . Tonsillectomy    . Appendectomy    . Cataract extraction, bilateral     Social History:  reports that she has never smoked. She does not have any smokeless tobacco history on file. She reports that she does not drink alcohol or use illicit drugs.  Family / Support Systems Marital Status: Widow/Widower How Long?: 44 years Patient Roles: Parent, Other (Comment) (grandmother) Children: Valerie Pham - dtr - [phone] (h); [phone] (m);  Valerie Pham - dtr - [phone] (h) Caregiver Availability: Other (Comment) (Family to determine if they can provide 24/7 supervision with their support and paid caregiver.) Family Dynamics: Supportive children and extended family.  Social History Preferred language: English Religion:  Education: college - teaching degree Read: Yes Write: Yes Employment Status: Retired Date Retired/Disabled/Unemployed: 1996  Age Retired: 85 Special educational needs teacher Issues: none reported Guardian/Conservator: N/A - MD has determined that the pt is capable of making her own decisions.   Abuse/Neglect Physical Abuse: Denies Verbal Abuse: Denies Sexual Abuse: Denies Exploitation of patient/patient's resources: Denies Self-Neglect: Denies  Emotional Status Pt's affect, behavior and adjustment status: Pt was very tired during assessment visit, but she was pleasant and glad to be on inpt rehab, as were her dtrs.  Dtrs are very pleased with therapies.  Pt is motivated to work, yet limited by fatigue. Recent Psychosocial Issues: none reported Psychiatric History: none reported Substance Abuse History: none reported  Patient / Family Perceptions, Expectations & Goals Pt/Family understanding of illness & functional limitations: Pt/family seem to have a good understanding of pt's condition and limitations, but wish to wait another week to see how pt progresses before deciding how to best meet her needs after d/c.  They have some unanswered questions for the MD and CSW has asked MD to f/u with pt/family. Premorbid pt/family roles/activities: Pt likes to watch TV, go to the senior Pham, spend time with family.  She used to like to read, but cannot read as easily as she used to. Anticipated changes in roles/activities/participation: Pt would like to resume activities as she is able. Pt/family expectations/goals: Pt/family want for pt to rehabilitate to her fullest potential and return to her previous living situation.  Community Resources [employer]: Other (Comment) (Pt goes to the senior Pham 2 days/week.) Premorbid Home Care/DME Agencies: Other (Comment) (Pt has a paid caregiver at home for 5 hours per day.) Transportation available at  discharge: family Resource referrals recommended: Neuropsychology, Support group (specify)  Discharge Planning Living Arrangements: Alone Support Systems: Children, Other relatives,  Friends/neighbors, Home care staff Type of Residence: Private residence Insurance Resources: Multimedia programmer (specify) (Marine scientist) Financial Resources: Luke Referred: No Money Management: Patient, Family Does the patient have any problems obtaining your medications?: No Home Management: Pt's caregiver and family can assist with this. Patient/Family Preliminary Plans: Family awaits another week of pt's progress to make final decisions about pt's discharge plans. Barriers to Discharge: Steps, Self care Social Work Anticipated Follow Up Needs: HH/OP, Support Group Expected length of stay: 14 to 17 days  Clinical Impression CSW met with pt and her two dtrs, Valerie Pham and Valerie Pham, to introduce self and role of CSW, as well as to complete assessment.  Pt/family pleased pt was accepted to CIR and hope for her to be able to return to her home at d/c.  Pt had a caregiver for 5 hours/day and attended a senior Pham a couple days a week.  They hope, in time, she can return to doing these things, but recognize pt has to make some progress before that can happen.  They would like to see pt's progress over the next week prior to making any decisions about pt's care at home.  CSW did tell dtrs that we expect that pt will need 24/7 supervision, at a minimum.  They were unsure if pt could afford to up her paid caregiver hours that much and they were thinking of different family members who could assist, perhaps setting up a schedule.  CSW will continue to follow pt and assist them with d/c plan, keeping them updated on pt's progress, although family is often present.  Valerie Pham, Silvestre Mesi 01/21/2015, 12:30 PM

## 2015-01-21 NOTE — Progress Notes (Signed)
[place] PHYSICAL MEDICINE & REHABILITATION     PROGRESS NOTE    Subjective/Complaints:  Patient seen this morning sitting up in bed eating breakfast. She had a good night last night. Her finger still bothers her.  ROS: + Pain and edema secondary to left upper extremity. Denies CP, SOB, N/V/D  Objective: Vital Signs: Blood pressure 158/80, pulse 68, temperature 98.9 F (37.2 C), temperature source Axillary, resp. rate 18, weight 95.6 kg (210 lb 12.2 oz), SpO2 98 %. [place] Extrem Up Left Ltd  01/20/2015  CLINICAL DATA:  Left hand index finger and small finger inflammation. Possible gout EXAM: ULTRASOUND LEFT UPPER EXTREMITY LIMITED TECHNIQUE: Ultrasound examination of the upper extremity soft tissues was performed in the area of clinical concern. COMPARISON:  Radiographs 01/18/2015. FINDINGS: Evaluation was limited to the index and small fingers. No focal fluid collections are identified. No large joint effusion, synovial thickening or echogenic periarticular foci identified to suggest gouty tophi. Radiographs demonstrate osteoarthritic changes in the index and small fingers. IMPRESSION: No focal fluid collections or specific signs of gout identified within the index or small fingers. Electronically Signed   By: Carey BullocksWilliam  Veazey M.D.   On: 01/20/2015 08:13    Recent Labs  01/19/15 0630  WBC 7.3  HGB 12.9  HCT 40.0  PLT 194    Recent Labs  01/19/15 0630  NA 138  K 3.6  CL 105  GLUCOSE 226*  BUN 27*  CREATININE 0.83  CALCIUM 8.5*   CBG (last 3)   Recent Labs  01/20/15 1618 01/20/15 2124 01/21/15 0643  GLUCAP 129* 134* 155*    Wt Readings from Last 3 Encounters:  01/18/15 95.6 kg (210 lb 12.2 oz)  01/14/15 99 kg (218 lb 4.1 oz)    Physical Exam:  BP 158/80 mmHg  Pulse 68  Temp(Src) 98.9 F (37.2 C) (Axillary)  Resp 18  Wt 95.6 kg (210 lb 12.2 oz)  SpO2 98% Constitutional: She appears well-developed and well-nourished. Vital signs reviewed HENT: Normocephalic  and atraumatic. Conjunctivae and EOM are normal.  Neck: Normal range of motion. No thyromegaly present.  Cardiovascular: Normal rate and regular rhythm.  Systolic Murmur heard. Respiratory: Effort normal and breath sounds normal. No respiratory distress.  GI: Soft. Bowel sounds are normal. She exhibits no distension.  Musculoskeletal: + Edema and TTP over second digit of left upper extremity  Neurological: She is alert and oriented.  Follows commands.  Fair awareness of deficits Left hemifacial weakness, speech dysarthric Motor: LUE: Shoulder abduction 3/5, elbow flexion and extension 3+/5, finger grip 3/5  limited movement in first finger due to by pain.  LLE: Hip flexion 3/5, , ke 3+, ankle dorsi/plantar flexion 3+-/5 RUE/RLE: 5/5 prox to distal.  Skin: Skin is warm and dry.  Psychiatric: She has a normal mood and affect. Her behavior is normal. Reasonable insight and awareness  Assessment/Plan: 1. 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 Physiatrist is providing close team supervision and 24 hour management of active medical problems listed below. Physiatrist and rehab team continue to assess barriers to discharge/monitor patient progress toward functional and medical goals.  Function:  Bathing Bathing position   Position: Wheelchair/chair at sink  Bathing parts Body parts bathed by patient: Left arm, Chest, Abdomen Body parts bathed by helper: Buttocks, Back, Right arm  Bathing assist Assist Level:  (Max assist)  Upper Body Dressing/Undressing Upper body dressing   What is the patient wearing?: Pull over shirt/dress     Pull over shirt/dress - Perfomed by patient: Thread/unthread right sleeve, Put head through opening, Pull shirt over trunk Pull over shirt/dress - Perfomed by helper: Thread/unthread left  sleeve        Upper body assist Assist Level: Touching or steadying assistance(Pt > 75%)      Lower Body Dressing/Undressing Lower body dressing   What is the patient wearing?: Socks, [employer]- Performed by helper: Thread/unthread right pants leg, Thread/unthread left pants leg, Pull pants up/down       Socks - Performed by helper: Don/doff right sock, Don/doff left sock           TED Hose - Performed by helper: Don/doff right TED hose, Don/doff left TED hose  Lower body assist Assist for lower body dressing:  (Total assist)      Toileting Toileting          Toileting assist     Transfers Chair/bed transfer   Chair/bed transfer method: Stand pivot Chair/bed transfer assist level: Moderate assist (Pt 50 - 74%/lift or lower) Chair/bed transfer assistive device: Biochemist, clinical lift: Landscape architect     Max distance: 40 Assist level: Moderate assist (Pt 50 - 74%)   Wheelchair   Type: Manual Max wheelchair distance: 50 Assist Level: Moderate assistance (Pt 50 - 74%)  Cognition Comprehension Comprehension assist level: Understands basic 90% of the time/cues < 10% of the time  Expression Expression assist level: Expresses basic 75 - 89% of the time/requires cueing 10 - 24% of the time. Needs helper to occlude trach/needs to repeat words.  Social Interaction Social Interaction assist level: Interacts appropriately 90% of the time - Needs monitoring or encouragement for participation or interaction.  Problem Solving Problem solving assist level: Solves basic 75 - 89% of the time/requires cueing 10 - 24% of the time  Memory Memory assist level: Recognizes or recalls 50 - 74% of the time/requires cueing 25 - 49% of the time    Medical Problem List and Plan: 1.Left side weakness with gait instability secondary to right pontine infarct felt to be embolic  2.DVT Prophylaxis/Anticoagulation: Eliquis 3. Pain Management: Tylenol as  needed 4. Hypertension. Lopressor 50 mg twice a day. Monitor with increased mobility  Amlodipine  started 12/21  Will start losartan 25 today (home dose 100) and gradually increase  Will gradually restart home meds.  5. Neuropsych: This patient is capable of making decisions on her own behalf. 6. Skin/Wound Care: Routine skin checks 7. Fluids/Electrolytes/Nutrition: Routine I&O   CMP on 12/21 within acceptable limits 8. Diabetes mellitus with peripheral neuropathy. Hemoglobin A1c 7.6. Check blood sugars before meals and at bedtime.   Patient on Glucotrol 10 mg daily and glucophage 500 mg twice daily at home.  Restarted her medications on 12/21, will cont to monitor and make further adjustments if necessary by next week  Diabetic teaching 9. Hyperlipidemia. Lipitor 10. Hypothyroidism. Synthroid 11. UTI. Urine culture multi-species. Cipro discontinued.  Repeat Ucx suggesting >100K gram negative rods, await final result.  12. Gout: Pt with hx and swelling of 2nd digit LUE.  Xray reviewed from 12/20.   Ultrasound reviewed, Uric acid elevated  Cont Colchicine   LOS (Days) 3 A FACE TO FACE EVALUATION WAS PERFORMED  Shanard Treto Karis Juba 01/21/2015 9:14 AM

## 2015-01-22 ENCOUNTER — Inpatient Hospital Stay (HOSPITAL_COMMUNITY): Payer: Medicare Other | Admitting: Occupational Therapy

## 2015-01-22 ENCOUNTER — Inpatient Hospital Stay (HOSPITAL_COMMUNITY): Payer: Medicare Other | Admitting: Physical Therapy

## 2015-01-22 ENCOUNTER — Inpatient Hospital Stay (HOSPITAL_COMMUNITY): Payer: Medicare Other | Admitting: Speech Pathology

## 2015-01-22 LAB — GLUCOSE, CAPILLARY
GLUCOSE-CAPILLARY: 134 mg/dL — AB (ref 65–99)
GLUCOSE-CAPILLARY: 130 mg/dL — AB (ref 65–99)
Glucose-Capillary: 138 mg/dL — ABNORMAL HIGH (ref 65–99)
Glucose-Capillary: 99 mg/dL (ref 65–99)

## 2015-01-22 MED ORDER — LOSARTAN POTASSIUM 50 MG PO TABS
50.0000 mg | ORAL_TABLET | Freq: Every day | ORAL | Status: DC
Start: 2015-01-22 — End: 2015-01-24
  Administered 2015-01-22 – 2015-01-23 (×2): 50 mg via ORAL
  Filled 2015-01-22 (×3): qty 1

## 2015-01-22 NOTE — Progress Notes (Addendum)
[place] PHYSICAL MEDICINE & REHABILITATION     PROGRESS NOTE    Subjective/Complaints:  Patient feels well this morning. She is sitting up in bed eating breakfast. No complaints.  Objective: Vital Signs: Blood pressure 175/70, pulse 60, temperature 98.1 F (36.7 C), temperature source Oral, resp. rate 18, weight 210 lb 12.2 oz (95.6 kg), SpO2 96 %. CBG (last 3)   Recent Labs  01/21/15 1651 01/21/15 2046 01/22/15 0712  GLUCAP 152* 166* 138*   Physical Exam:  BP 175/70 mmHg  Pulse 60  Temp(Src) 98.1 F (36.7 C) (Oral)  Resp 18  Wt 210 lb 12.2 oz (95.6 kg)  SpO2 96%   elderly female in no acute distress. Neck is supple without lymphadenopathy HEENT exam: Atraumatic, normocephalic Chest is clear to auscultation without any increased work of breathing. Cardiac exam S1 and S2 are regular with a 2/6 oh systolic murmur heard best at the apex. Abdominal exam overweight, active bowel sounds, soft. Extremities no significant edema in legs. Left hand: Second and fifth fingers are swollen at the PIP joint. Minimally tender to palpation. She has trouble flexing at these joints. Assessment/Plan: 1. 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 Pain Management: Tylenol as needed 4. Hypertension. Lopressor 50 mg twice a day. Monitor with increased mobility  Amlodipine 5mg  started 12/21  Increase losartan to 50 (home dose 100) 5. Neuropsych: This patient is capable of making decisions on her own behalf. 6. Skin/Wound Care: Routine skin checks 7. Fluids/Electrolytes/Nutrition: Routine I&O    Basic Metabolic Panel:    Component Value Date/Time   NA 138 01/19/2015 0630   K 3.6 01/19/2015 0630   CL 105 01/19/2015 0630   CO2 23 01/19/2015 0630   BUN 27* 01/19/2015  0630   CREATININE 0.83 01/19/2015 0630   GLUCOSE 226* 01/19/2015 0630   CALCIUM 8.5* 01/19/2015 0630    8. Diabetes mellitus with peripheral neuropathy. Hemoglobin A1c 7.6.  CBG (last 3)   Recent Labs  01/21/15 1651 01/21/15 2046 01/22/15 0712  GLUCAP 152* 166* 138*   Patient on Glucotrol 10 mg daily and glucophage 500 mg twice daily at home.  Restarted her medications on 12/21, will cont to monitor and make further adjustments if necessary by next week   9. Hyperlipidemia. Lipitor 10. Hypothyroidism. Synthroid 11. UTI. Urine culture multi-species. Cipro discontinued.  Repeat Ucx suggesting >100K gram negative rods, e.coli. Reviewed sensitivities. She has no symptoms and i suspect she is colonized. Will ask daily about symptoms before treating 12. Gout: Pt with hx and swelling of 2nd digit LUE.  Xray reviewed from 12/20.   Ultrasound reviewed, Uric acid elevated  Cont Colchicine    LOS (Days) 4 A FACE TO FACE EVALUATION WAS PERFORMED  [facility] HospitalWORDS,Avigayil Ton HENRY 01/22/2015 8:18 AM

## 2015-01-22 NOTE — Consult Note (Signed)
NEUROCOGNITIVE TESTING - CONFIDENTIAL [facility]   Ms. Valerie Pham is an 81 year old woman, who was seen for a brief neuropsychological assessment to evaluate her cognitive and emotional functioning in the setting of stroke.  According to her medical record, she was admitted on 01/13/15 with left-sided weakness and slurred speech.  MRI of her brain revealed a right pontine CVA.  She did not receive TPA.  Owing to evidence of cognitive difficulty at times, a neuropsychological consult was requested.      PROCEDURES: [3 units of 16109 on 01/20/15]  The following tests were performed during today's visit: Mini Mental Status Examination (brief version), Repeatable Battery for the Assessment of Neuropsychological Status (RBANS, form C), Geriatric Anxiety Inventory, and the Geriatric Depression Scale (short form).  Test results are as follows:   MMSE-2 brief Raw Score = 11/16 Description = Impaired    RBANS Indices Scaled Score Percentile Description  Immediate Memory  57 < 1 Profoundly Impaired  Visuospatial/Constructional 87 19 Below Average  Language 86 18 Below Average  Attention 72 3 Impaired   Delayed Memory 82 12 Below Average  Total Score 70 2 Profoundly Impaired   RBANS Subtests Raw Score Percentile Description  List Learning 12 1 Profoundly Impaired  Story Memory 7 2 Profoundly Impaired  Figure Copy 13 1 Profoundly Impaired  Line Orientation 17 70 Average  Picture Naming 9 45 Average  Semantic Fluency 10 2 Profoundly Impaired  Digit Span 9 45 Average  Coding 0 < 1 Profoundly Impaired  List Recall 0 < 1 Profoundly Impaired  List Recognition 20 55 Average  Story Recall 3 5 Impaired  Figure recall 0 < 1 Profoundly Impaired   Geriatric Depression Scale (short form) Raw Score = 0/15 Description = WNL   Geriatric Anxiety Inventory Raw Score = 0/20 Description = WNL   Although embedded effort indicators did not suggest suboptimal task engagement, Ms.  Valerie Pham was observed to quickly say that she did not know answers.  Many times, after she was pushed to provide a response, she knew the requested information.  In addition, she was observed to have trouble with hearing and vision that adversely impacted her test performances on occasion.  Therefore, the current scores should be interpreted with caution as they may represent an underestimation of her actual cognitive abilities at this time.    Ms. 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 stroke).  Her greatest areas of weakness were attention and processing speed; abilities that are commonly impacted post-stroke regardless of the specific location of the stroke and which could also be impacted by fatigue or pain.  Regarding memory, she performed better when she heard information multiple times and repeated it out loud.  Ms. Valerie Pham typically freely recalled a majority of the limited information that she initially encoded following delays and was also aided by provision of recognition cues.  In this way, her memory struggles seemed to be more related to inattention initially than to a true memory deficit.  She demonstrated relative strengths in visuospatial abilities (despite vision problems), and language skills.    From an emotional standpoint, Ms. Valerie Pham did not endorse symptoms suggestive of clinically significant depression or anxiety at this time.   In light of these findings, the following recommendations are provided and were discussed with Ms. Valerie Pham and her daughter immediately following the current appointment.    RECOMMENDATIONS:  Recommendations for treatment team:   .  When interacting with Ms. Valerie Pham, directions and information should be provided in a simple, straight forward manner, and the treatment team should avoid giving multiple instructions simultaneously.   . Ms. Valerie Pham may also benefit from being provided with multiple trials to learn new skills given the noted memory inefficiencies.  . If Ms. Valerie Pham does not immediately recall something that she learned, provision of reminder cues may be helpful.   . Ms. Valerie Pham will likely remember information better if she has said it out loud, so it may be beneficial to have her repeat important instructions back out loud as she is learning them.   . Be aware that Ms. Valerie Pham may be quick to give up or say that she does not know something when she actually does. Challenge her on this, as appropriate.   Marland Kitchen. Be aware that she may not recall information, in part, due to poor hearing; those interacting with her should be sure that they are speaking loud enough for her to hear.   . To the extent possible, multitasking should be avoided. . Ms. Valerie Pham requires more time than typical to process information. The treatment team may benefit from waiting for a verbal response to information before presenting additional information.  . Performance will generally be best in a structured, routine, and familiar environment, as opposed to situations involving complex problems.  . If time allows, a repeat neuropsychological evaluation prior to discharge may be useful to help determine safety needs from a cognitive standpoint.    Recommendations for discharge planning:  . Complete a comprehensive neuropsychological evaluation as an outpatient in 8-12 months to assess for interval change. . Maintain engagement in mentally, physically and cognitively stimulating activities.  . Strive to maintain a healthy lifestyle (e.g., proper diet and exercise) in order to promote physical, cognitive and emotional health.   Leavy CellaKaren Crysten Kaman, Psy.D.  Clinical Neuropsychologist

## 2015-01-22 NOTE — Progress Notes (Signed)
Occupational Therapy Session Note  Patient Details  Name: Valerie Pham MRN: [id number] Date of Birth: [birth year]  Today's Date: 01/22/2015 OT Individual Time:  -   1615-1700 (45 min)      Short Term Goals: Week 1:  OT Short Term Goal 1 (Week 1): Pt will complete LB dressing with max assist OT Short Term Goal 2 (Week 1): Pt will complete bathing with mod assist OT Short Term Goal 3 (Week 1): Pt will complete toilet transfers with min assist OT Short Term Goal 4 (Week 1): Pt will complete 2 grooming tasks in standing with min assist      Skilled Therapeutic Interventions/Progress Updates:    Addressed trunk control, sit > stand, and attention to left.  Completed squat pivot transfer to toilet  with min assist and tactile cues for anterior weight shift.   Stood to pull up pants with min assist for balance and reaching to back.  Transitioned to activities at the sink.  Practiced sit to stand at the sink and holding one hand up for seconds x5 on each side.  Had pt practiced shifting over to left to touch OT beside her. Used mirror for visual feedback  for midline orientation. Practiced reaching with LUE and minimal assist for upward elevation.  Pt reported no pain in LUE but stated it felt weak. She combed hair and washed hands while standing at min assist level.   Pt required multiple rest breaks throughout standing task.  Marland Kitchen. Performed transfer from wc to recliner with cues for set up and wc safety.  Pt completed dinner with set up assist for opening containers.     Therapy Documentation Precautions:  Precautions Precautions: Fall Restrictions Weight Bearing Restrictions: No General:   Vital Signs:  Pain: Pain Assessment Pain Assessment: No/denies pain       :    See Function Navigator for Current Functional Status.   Therapy/Group: Individual Therapy  Valerie Pham, Valerie Pham 01/22/2015, 4:18 PM

## 2015-01-22 NOTE — Progress Notes (Signed)
Physical Therapy Session Note  Patient Details  Name: Valerie Pham MRN: [id number] Date of Birth: 05/25/1927  Today's Date: 01/22/2015 PT Individual Time: 0835-0930 PT Individual Time Calculation (min): 55 min   Short Term Goals: Week 1:  PT Short Term Goal 1 (Week 1): Pt will perfor bed mobility S and min verbal cues PT Short Term Goal 2 (Week 1): Pt will perform sit <>stand with consistent minA PT Short Term Goal 3 (Week 1): Pt will perform stand pivot transfer close S with RW PT Short Term Goal 4 (Week 1): Pt will perform gait x50' with LRAD and modA PT Short Term Goal 5 (Week 1): Pt will perform ascent/descent of 8 3-inch stairs with modA  Skilled Therapeutic Interventions/Progress Updates:    Pt received seated in w/c, no c/o pain and agreeable to treatment. Questioned pt regarding L hand pain and pt states "It's coming along", and states no pain currently however declines using LUE to push w/c due to discomfort. W/c propulsion with R hemi technique x50' modA due to difficulty steering, poor traction on ground with RLE. Squat pivot transfer > mat table minA with assistance to ensure B hips cleared over arm rest. Sit <>stand and standing x3-4 min for 4 trials with close S while performing pipe tree. Requires min verbal cues to encourage functional LUE use. Gait to chair beside stairs x10'; requires cues for upright posture, increased step length and to maintain RW close to body when beginning to turn and avoid reaching for furniture. Stairs x4 total 3-inch height with B handrails; ascent forward, descent backward. Verbal cues for sequencing to lead with stronger LE, and for upright posture to reduce B elbows leaning on rails with forward flexed posture. Standing LE strengthening exercises in parallel bars including marching, hamstring curl, hip abduction for 2 sets 10 reps BLE. Repetitive cues throughout session to push up from support surface when standing and pt appears to have no recall of  previous cues, requires explanation each time for why this is important for safety. Also incorrectly reported during session that today is the first day working with therapy since her stroke. Returned to room totalA for energy conservation. Stand pivot to return to recliner with minA. Remained seated in recliner with quick release belt intact and all needs within reach.   Therapy Documentation Precautions:  Precautions Precautions: Fall Restrictions Weight Bearing Restrictions: No Pain: Pain Assessment Pain Assessment: No/denies pain Pain Score: 0-No pain   See Function Navigator for Current Functional Status.   Therapy/Group: Individual Therapy  Vista Lawmanlizabeth J Tygielski 01/22/2015, 9:33 AM

## 2015-01-22 NOTE — Progress Notes (Signed)
Speech Language Pathology Daily Session Note  Patient Details  Name: Valerie Pham MRN: [id number] Date of Birth: 1927-09-04  Today's Date: 01/22/2015 SLP Individual Time: [id number] SLP Individual Time Calculation (min): 44 min  Short Term Goals: Week 1: SLP Short Term Goal 1 (Week 1): Pt will achieve intelligibility in conversations with supervision cues over 3 targeted sessions.   SLP Short Term Goal 2 (Week 1): Pt will utilize compensatory dysarthria strategies in conversations wtih supervision cues.    Skilled Therapeutic Interventions:  Pt was seen for skilled ST targeting communication goals.  SLP facilitated the session with a novel card game targeting speech intelligibility in conversations with the added challenge of multitasking and distractions.  Pt was 80% intelligible at the conversational level during the abovementioned task and during loosely structured conversations with the SLP with supervision cues for overarticulation.  Pt was left in recliner with call bell left within reach.  Continue per current plan of care.     Function:  Eating Eating               Cognition Comprehension Comprehension assist level: Understands basic 90% of the time/cues < 10% of the time  Expression   Expression assist level: Expresses basic 75 - 89% of the time/requires cueing 10 - 24% of the time. Needs helper to occlude trach/needs to repeat words.  Social Interaction Social Interaction assist level: Interacts appropriately 90% of the time - Needs monitoring or encouragement for participation or interaction.  Problem Solving Problem solving assist level: Solves basic 75 - 89% of the time/requires cueing 10 - 24% of the time  Memory Memory assist level: Recognizes or recalls 50 - 74% of the time/requires cueing 25 - 49% of the time    Pain Pain Assessment Pain Assessment: No/denies pain  Therapy/Group: Individual Therapy  Ngoc Detjen, Melanee SpryNicole L 01/22/2015, 2:49 PM

## 2015-01-22 NOTE — Progress Notes (Signed)
Physical Therapy Session Note  Patient Details  Name: Valerie Pham MRN: [id number] Date of Birth: July 25, 1927  Today's Date: 01/22/2015 PT Individual Time: 1115-1200 PT Individual Time Calculation (min): 45 min   Short Term Goals: Week 1:  PT Short Term Goal 1 (Week 1): Pt will perfor bed mobility S and min verbal cues PT Short Term Goal 2 (Week 1): Pt will perform sit <>stand with consistent minA PT Short Term Goal 3 (Week 1): Pt will perform stand pivot transfer close S with RW PT Short Term Goal 4 (Week 1): Pt will perform gait x50' with LRAD and modA PT Short Term Goal 5 (Week 1): Pt will perform ascent/descent of 8 3-inch stairs with modA  Skilled Therapeutic Interventions/Progress Updates:    Pt received resting in recliner and agreeable to therapy session.  Squat/pivot recliner to w/c with min assist for balance and verbal cues for sequencing and hand placement.  PT propelled pt to therapy gym and squat/pivot from w/c>Nustep.  Pt performed 10 min on Nustep with BLEs only at level 3 for NMR and overall endurance.  Squat/pivot Nustep>w/c and pt propelled w/c to // bars.  Sit>stand at // x2 with steady assist and verbal cues for pushing up from w/c.  PT instructed patient in alternating toe taps 2x8 reps with seated rest break in between sets.  PT propelled pt back to room and squat/pivot from w/c>recliner with min assist. Pt left in recliner with call bell in reach and needs met.   Therapy Documentation Precautions:  Precautions Precautions: Fall Restrictions Weight Bearing Restrictions: No Pain: Pain Assessment Pain Assessment: No/denies pain Pain Score: 0-No pain   See Function Navigator for Current Functional Status.   Therapy/Group: Individual Therapy  Earnest Conroy Penven-Crew 01/22/2015, 12:38 PM

## 2015-01-23 LAB — GLUCOSE, CAPILLARY
GLUCOSE-CAPILLARY: 133 mg/dL — AB (ref 65–99)
GLUCOSE-CAPILLARY: 158 mg/dL — AB (ref 65–99)
Glucose-Capillary: 134 mg/dL — ABNORMAL HIGH (ref 65–99)
Glucose-Capillary: 106 mg/dL — ABNORMAL HIGH (ref 65–99)

## 2015-01-23 NOTE — Progress Notes (Signed)
[place] PHYSICAL MEDICINE & REHABILITATION     PROGRESS NOTE    Subjective/Complaints:  Patient feels well. She enjoyed therapy yesterday. She is sitting up in a chair and eating breakfast.  Objective: Vital Signs: Blood pressure 159/70, pulse 66, temperature 98.5 F (36.9 C), temperature source Oral, resp. rate 16, weight 210 lb 12.2 oz (95.6 kg), SpO2 99 %. CBG (last 3)   Recent Labs  01/22/15 1639 01/22/15 2108 01/23/15 0648  GLUCAP 99 130* 134*   Physical Exam:  BP 159/70 mmHg  Pulse 66  Temp(Src) 98.5 F (36.9 C) (Oral)  Resp 16  Wt 210 lb 12.2 oz (95.6 kg)  SpO2 99%   elderly female in no acute distress. Neck is supple without lymphadenopathy HEENT exam: Atraumatic, normocephalic Chest is clear to auscultation without any increased work of breathing. Cardiac exam S1 and S2 are regular with a 2/6 holo systolic murmur heard best at the apex. Abdominal exam overweight, active bowel sounds, soft. Extremities no significant edema in legs. Left hand: Second and fifth fingers are less swollen today. Assessment/Plan: 1. 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 Pain Management: Tylenol as needed 4. Hypertension.    155/63-159/70      Lopressor 50 mg twice a day. Monitor with increased mobility  Amlodipine 5mg  started 12/21  Increase losartan to 50mg  on 01/22/2015  (home dose 100) 5. Neuropsych: This patient is capable of making decisions on her own behalf. 6. Skin/Wound Care: Routine skin checks 7. Fluids/Electrolytes/Nutrition: Routine I&O    Basic Metabolic Panel:    Component Value Date/Time   NA 138 01/19/2015 0630   K 3.6 01/19/2015 0630   CL 105 01/19/2015 0630   CO2 23 01/19/2015 0630   BUN 27* 01/19/2015 0630   CREATININE 0.83  01/19/2015 0630   GLUCOSE 226* 01/19/2015 0630   CALCIUM 8.5* 01/19/2015 0630    8. Diabetes mellitus with peripheral neuropathy. Hemoglobin A1c 7.6.  CBG (last 3)   Recent Labs  01/22/15 1639 01/22/15 2108 01/23/15 0648  GLUCAP 99 130* 134*  reasonable control. Continue current medications.    9. Hyperlipidemia. Lipitor 10. Hypothyroidism. Synthroid 11. UTI. Urine culture multi-species. Cipro discontinued.  Repeat Ucx suggesting >100K gram negative rods, e.coli. Reviewed sensitivities. She has no symptoms and i suspect she is colonized. Will ask daily about symptoms before treating disease (no symptoms on 01/23/2015).  12. Gout: Pt with hx and swelling of 2nd digit LUE.   Uric acid elevated  Cont Colchicine    LOS (Days) 5 A FACE TO FACE EVALUATION WAS PERFORMED  [facility] StationWORDS,Tanveer Dobberstein HENRY 01/23/2015 8:56 AM

## 2015-01-24 ENCOUNTER — Inpatient Hospital Stay (HOSPITAL_COMMUNITY): Payer: Medicare Other | Admitting: Occupational Therapy

## 2015-01-24 ENCOUNTER — Inpatient Hospital Stay (HOSPITAL_COMMUNITY): Payer: Medicare Other | Admitting: *Deleted

## 2015-01-24 ENCOUNTER — Inpatient Hospital Stay (HOSPITAL_COMMUNITY): Payer: Medicare Other

## 2015-01-24 ENCOUNTER — Inpatient Hospital Stay (HOSPITAL_COMMUNITY): Payer: Medicare Other | Admitting: Speech Pathology

## 2015-01-24 LAB — GLUCOSE, CAPILLARY
GLUCOSE-CAPILLARY: 82 mg/dL (ref 65–99)
GLUCOSE-CAPILLARY: 118 mg/dL — AB (ref 65–99)
Glucose-Capillary: 141 mg/dL — ABNORMAL HIGH (ref 65–99)
Glucose-Capillary: 105 mg/dL — ABNORMAL HIGH (ref 65–99)

## 2015-01-24 MED ORDER — LOSARTAN POTASSIUM 50 MG PO TABS
100.0000 mg | ORAL_TABLET | Freq: Every day | ORAL | Status: DC
  Administered 2015-01-25 – 2015-02-03 (×10): 100 mg via ORAL
  Filled 2015-01-24 (×10): qty 2

## 2015-01-24 NOTE — Progress Notes (Signed)
[place] PHYSICAL MEDICINE & REHABILITATION     PROGRESS NOTE    Subjective/Complaints:  Patient seen this morning lying in bed. She states she slept well overnight. The pain, edema, range of motion have all improved in her finger.  ROS: Denies CP, SOB, N/V/D  Objective: Vital Signs: Blood pressure 195/83, pulse 69, temperature 98.2 F (36.8 C), temperature source Oral, resp. rate 18, weight 95.6 kg (210 lb 12.2 oz), SpO2 96 %. No results found. No results for input(s): WBC, HGB, HCT, PLT in the last 72 hours. No results for input(s): NA, K, CL, GLUCOSE, BUN, CREATININE, CALCIUM in the last 72 hours.  Invalid input(s): CO CBG (last 3)   Recent Labs  01/23/15 1642 01/23/15 2053 01/24/15 0638  GLUCAP 133* 158* 141*    Wt Readings from Last 3 Encounters:  01/18/15 95.6 kg (210 lb 12.2 oz)  01/14/15 99 kg (218 lb 4.1 oz)    Physical Exam:  BP 195/83 mmHg  Pulse 69  Temp(Src) 98.2 F (36.8 C) (Oral)  Resp 18  Wt 95.6 kg (210 lb 12.2 oz)  SpO2 96% Constitutional: She appears well-developed and well-nourished. Vital signs reviewed HENT: Normocephalic and atraumatic. Conjunctivae and EOM are normal.  Neck: Normal range of motion. No thyromegaly present.  Cardiovascular: Normal rate and regular rhythm.  Systolic Murmur heard. Respiratory: Effort normal and breath sounds normal. No respiratory distress.  GI: Soft. Bowel sounds are normal. She exhibits no distension.  Musculoskeletal: + Edema and TTP over second digit of left upper extremity (improving) Neurological: She is alert and oriented.  Follows commands.  Fair awareness of deficits Left hemifacial weakness, speech dysarthric Motor: LUE: Shoulder abduction 3/5, elbow flexion and extension 4-/5, finger grip 3+/5  limited movement in first finger due to by pain (significantly improved) LLE: Hip flexion 3/5, , ke 3+, ankle dorsi/plantar flexion 3+/5 RUE/RLE: 5/5 prox to distal.  Skin: Skin is warm and dry.   Psychiatric: She has a normal mood and affect. Her behavior is normal. Reasonable insight and awareness  Assessment/Plan: 1. 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 Physiatrist is providing close team supervision and 24 hour management of active medical problems listed below. Physiatrist and rehab team continue to assess barriers to discharge/monitor patient progress toward functional and medical goals.  Function:  Bathing Bathing position Bathing activity did not occur: Refused Position: Wheelchair/chair at sink  Bathing parts Body parts bathed by patient: Left arm, Chest, Abdomen Body parts bathed by helper: Buttocks, Back, Right arm  Bathing assist Assist Level:  (Max assist)      Upper Body Dressing/Undressing Upper body dressing   What is the patient wearing?: Pull over shirt/dress     Pull over shirt/dress - Perfomed by patient: Thread/unthread right sleeve, Thread/unthread left sleeve, Put head through opening Pull over shirt/dress - Perfomed by helper: Pull shirt over trunk        Upper body assist Assist Level: Touching or steadying assistance(Pt > 75%)      Lower Body Dressing/Undressing Lower body dressing   What is the patient wearing?: Socks, [employer]- Performed by helper: Thread/unthread right pants leg, Thread/unthread left pants leg, Pull pants up/down       Socks - Performed by helper: Don/doff right sock, Don/doff left sock  TED Hose - Performed by helper: Don/doff right TED hose, Don/doff left TED hose  Lower body assist Assist for lower body dressing:  (Total assist)      Toileting Toileting   Toileting steps completed by patient: Performs perineal hygiene, Adjust clothing prior to toileting Toileting steps completed by helper: Adjust clothing after  toileting Toileting Assistive Devices: Grab bar or rail  Toileting assist Assist level: Touching or steadying assistance (Pt.75%)   Transfers Chair/bed transfer   Chair/bed transfer method: Squat pivot Chair/bed transfer assist level: Touching or steadying assistance (Pt > 75%) Chair/bed transfer assistive device: Armrests Mechanical lift: Landscape architecttedy   Locomotion Ambulation     Max distance: 10 Assist level: Touching or steadying assistance (Pt > 75%)   Wheelchair   Type: Manual Max wheelchair distance: 50 Assist Level: Touching or steadying assistance (Pt > 75%)  Cognition Comprehension Comprehension assist level: Understands basic 90% of the time/cues < 10% of the time  Expression Expression assist level: Expresses basic 75 - 89% of the time/requires cueing 10 - 24% of the time. Needs helper to occlude trach/needs to repeat words.  Social Interaction Social Interaction assist level: Interacts appropriately 90% of the time - Needs monitoring or encouragement for participation or interaction.  Problem Solving Problem solving assist level: Solves basic 75 - 89% of the time/requires cueing 10 - 24% of the time  Memory Memory assist level: Recognizes or recalls 50 - 74% of the time/requires cueing 25 - 49% of the time    Medical Problem List and Plan: 1.Left side weakness with gait instability secondary to right pontine infarct felt to be embolic  2.DVT Prophylaxis/Anticoagulation: Eliquis 3. Pain Management: Tylenol as needed 4. Hypertension. Lopressor 50 mg twice a day. Monitor with increased mobility  Amlodipine 5mg  started 12/21  Losartan increased to 50 on 12/24, will increase to 100 today (home dose 100)   Will gradually restart home meds.  5. Neuropsych: This patient is capable of making decisions on her own behalf. 6. Skin/Wound Care: Routine skin checks 7. Fluids/Electrolytes/Nutrition: Routine I&O   CMP on 12/21 within acceptable limits  Will repeat labs tomorrow 8.  Diabetes mellitus with peripheral neuropathy. Hemoglobin A1c 7.6. Check blood sugars before meals and at bedtime.   Patient on Glucotrol 10 mg daily and glucophage 500 mg twice daily at home.  Restarted her medications on 12/21, will cont to monitor and make further adjustments if necessary this week  CBGs within acceptable range today, fasting 141  Diabetic teaching 9. Hyperlipidemia. Lipitor 10. Hypothyroidism. Synthroid 11. UTI. Urine culture multi-species. Cipro discontinued.  Repeat urine culture showing, E. Coli, however, pt asymptomatic (possibly contaminant).  Will cont to monitor and consider treatment if pt becomes symptomatic  12. Gout: Pt with hx and swelling of 2nd digit LUE.  Xray reviewed from 12/20.   Ultrasound reviewed, Uric acid elevated  Cont Colchicine  Improving   LOS (Days) 6 A FACE TO FACE EVALUATION WAS PERFORMED  Ankit Karis Jubanil Patel 01/24/2015 8:03 AM

## 2015-01-24 NOTE — Progress Notes (Signed)
Physical Therapy Session Note  Patient Details  Name: Valerie Pham MRN: [id number] Date of Birth: September 22, 1927  Today's Date: 01/24/2015 PT Individual Time: 1000-1030 PT Individual Time Calculation (min): 30 min   Short Term Goals: Week 1:  PT Short Term Goal 1 (Week 1): Pt will perfor bed mobility S and min verbal cues PT Short Term Goal 2 (Week 1): Pt will perform sit <>stand with consistent minA PT Short Term Goal 3 (Week 1): Pt will perform stand pivot transfer close S with RW PT Short Term Goal 4 (Week 1): Pt will perform gait x50' with LRAD and modA PT Short Term Goal 5 (Week 1): Pt will perform ascent/descent of 8 3-inch stairs with modA  Skilled Therapeutic Interventions/Progress Updates:    Session focused on neuro re-ed to address balance, strengthening (focus on hip abduction/adduction), sit <-> stands (with ball between knees of 2 different sizes to increase challenge) x 10 reps total without use of UE support, forced use of LLE during various mobility tasks, and side stepping with theraband around knees in wider stance for increased muscle activation x 2 trials with mod assist. Pt min assist with squat pivot transfers and up to mod assist for balance/movement in standing. W/c mobility training to engage LUE in functional movement re-training but pt with difficulty with sequencing and use (only BUE) of LUE due to weakness and required up to mod assist for propulsion. End of session left up in w/c with all needs in reach and safety belt donned.   Therapy Documentation Precautions:  Precautions Precautions: Fall Restrictions Weight Bearing Restrictions: No  Pain: Pain Assessment Pain Assessment: No/denies pain  See Function Navigator for Current Functional Status.   Therapy/Group: Individual Therapy  Karolee StampsGray, Magan Winnett Darrol PokeBrescia  Sallie Maker B. Mirai Greenwood, PT, DPT  01/24/2015, 10:58 AM

## 2015-01-24 NOTE — Progress Notes (Signed)
Physical Therapy Session Note  Patient Details  Name: Valerie Pham MRN: [id number] Date of Birth: 1927/06/25  Today's Date: 01/24/2015 PT Individual Time: 0800-0900 PT Individual Time Calculation (min): 60 min   Short Term Goals: Week 1:  PT Short Term Goal 1 (Week 1): Pt will perfor bed mobility S and min verbal cues PT Short Term Goal 2 (Week 1): Pt will perform sit <>stand with consistent minA PT Short Term Goal 3 (Week 1): Pt will perform stand pivot transfer close S with RW PT Short Term Goal 4 (Week 1): Pt will perform gait x50' with LRAD and modA PT Short Term Goal 5 (Week 1): Pt will perform ascent/descent of 8 3-inch stairs with modA Week 2:     Skilled Therapeutic Interventions/Progress Updates:  Tx focused on functional mobility training, gait with RW, and NMR via forced use, manual facilitation, and multi-modal cues. Pt resting in bed upon arrival, feeling well overall without pain.   Pt sat unsupported EOB for dressing, needing only S for dynamic sitting balance during functional tasks. See function tab for dressing details, needing only assist for bra strap and puling up pants in standing.   Mod A for lifting during multiple sit<>stands at RW through out tx with cues for safety, technique, and slow lowering. Min A once up for stand-step transfers with RW WC<>mat/bed, safety cues for stepping with LLE and completing turn prior to sitting.   WC propulsion with bil LEs and RUE for NMR and activity tolerance x50' with Min A for steering  Gait with RW x20' with +2 for WC follow and postural cues. Pt noted to have forward flexed posture and decreased hip/knee ext in stance, decreased L foot clearance and uneven step length. Distance limited by fatigue.   Seated NMR for LAQ on L with 5 sec holds.  Serial sit<>stands for hip strengthening and NMR x5 with hands for ascending/no hands descending. Would benefit from progressing to hip add ball squeeze during this task for increased  core strengthening.   [facility], all needs in reach.       Therapy Documentation Precautions:  Precautions Precautions: Fall Restrictions Weight Bearing Restrictions: No General:   Vital Signs: Therapy Vitals Temp: 98.2 F (36.8 C) Temp Source: Oral Pulse Rate: 68 Resp: 18 BP: (!) 166/72 mmHg Patient Position (if appropriate): Lying Oxygen Therapy SpO2: 96 % O2 Device: Not Delivered Pain:   Mobility:   Locomotion :    Trunk/Postural Assessment :    Balance:   Exercises:   Other Treatments:     See Function Navigator for Current Functional Status.   Therapy/Group: Individual Therapy  Virl CageyKAMPEN, Rain Wilhide M 01/24/2015, 8:10 AM

## 2015-01-24 NOTE — Plan of Care (Signed)
Problem: RH BLADDER ELIMINATION Goal: RH STG MANAGE BLADDER WITH ASSISTANCE STG Manage Bladder With Assistance. Mod I  Outcome: Not Progressing Incontinent x 1

## 2015-01-24 NOTE — Progress Notes (Signed)
Speech Language Pathology Daily Session Note  Patient Details  Name: Valerie Pham MRN: [id number] Date of Birth: August 15, 1927  Today's Date: 01/24/2015 SLP Individual Time: [id number] SLP Individual Time Calculation (min): 57 min  Short Term Goals: Week 1: SLP Short Term Goal 1 (Week 1): Pt will achieve intelligibility in conversations with supervision cues over 3 targeted sessions.   SLP Short Term Goal 2 (Week 1): Pt will utilize compensatory dysarthria strategies in conversations wtih supervision cues.    Skilled Therapeutic Interventions:  Pt was seen for skilled ST targeting communication goals.  SLP facilitated the session with a novel board game targeting speech intelligibility in conversations.  Pt was intelligible in conversations with both familiar and unfamiliar communication partners with overall supervision verbal cues.  Pt required increased min assist verbal cues for intelligibilty during conversations with added environmental distractions during a structured barrier task.   Pt was returned to room and left in recliner with call bell within reach.  Continue per current plan of care.    Function:  Eating Eating                 Cognition Comprehension Comprehension assist level: Understands basic 90% of the time/cues < 10% of the time  Expression   Expression assist level: Expresses basic 75 - 89% of the time/requires cueing 10 - 24% of the time. Needs helper to occlude trach/needs to repeat words.  Social Interaction Social Interaction assist level: Interacts appropriately 90% of the time - Needs monitoring or encouragement for participation or interaction.  Problem Solving Problem solving assist level: Solves basic 75 - 89% of the time/requires cueing 10 - 24% of the time  Memory Memory assist level: Recognizes or recalls 50 - 74% of the time/requires cueing 25 - 49% of the time    Pain Pain Assessment Pain Assessment: No/denies pain  Therapy/Group: Individual  Therapy  Joshua Zeringue, Melanee SpryNicole L 01/24/2015, 12:25 PM

## 2015-01-24 NOTE — Progress Notes (Signed)
Occupational Therapy Session Note  Patient Details  Name: Valerie MangoSara H Calloway MRN: [id number] Date of Birth: 11-02-1927  Today's Date: 01/24/2015 OT Individual Time: 1300-1400 OT Individual Time Calculation (min): 60 min    Short Term Goals: Week 1:  OT Short Term Goal 1 (Week 1): Pt will complete LB dressing with max assist OT Short Term Goal 2 (Week 1): Pt will complete bathing with mod assist OT Short Term Goal 3 (Week 1): Pt will complete toilet transfers with min assist OT Short Term Goal 4 (Week 1): Pt will complete 2 grooming tasks in standing with min assist  Skilled Therapeutic Interventions/Progress Updates:    Pt seen for OT therapy session  Focusing on functional mobility, functional standing balance, and UE coordination. Pt sitting up in recliner upon arrival, agreeable to tx session. She ambulated ~3440ft with RW towards therapy gym VCs required for management/ use of RW. She voiced fatigued and returned to w/c, propelling self remainder of way using B LEs, noted to be dragging L LE saying it was really difficult to use.  PT completed 9 hole peg test seated on mat. See results below. In standing, pt completed clothes pin tree. She was unable to manipulate black and blue  (highest resistant )clothes pin using L UE. Steadying assist required in standing with VCs for upright posture. She tolerated 2 trials of standing each ~ 3 minutes in duration before requiring seated rest break. Pt sat unsupported on EOM to complete fine motor peg task, requiring increased time due to decreased coordination.  She ambulated ~40 ft back to room. Pt attempting to hold onto hallway railing during ambulation while letting go of RW. Educated importance of using RW, fall risk while reaching for objects/ furniture when walking, and energy conservation. She required cuing during each transfer for hand placement and locking w/c brakes before standing. She demonstrated poor carry over of education throughout session.   Pt returned to room at end of session, requested return to supine requiring min A to bring LE on bed. Pt left in supine all needs in reach and bed alarm on.   9 Hole peg test:  R: 45 seconds L 2 min 40 sec  Therapy Documentation Precautions:  Precautions Precautions: Fall Restrictions Weight Bearing Restrictions: No Pain:   No/ denies pain  See Function Navigator for Current Functional Status.   Therapy/Group: Individual Therapy  Lewis, Ames Hoban C 01/24/2015, 7:20 AM

## 2015-01-25 ENCOUNTER — Inpatient Hospital Stay (HOSPITAL_COMMUNITY): Payer: Medicare Other | Admitting: Occupational Therapy

## 2015-01-25 ENCOUNTER — Inpatient Hospital Stay (HOSPITAL_COMMUNITY): Payer: Medicare Other

## 2015-01-25 ENCOUNTER — Inpatient Hospital Stay (HOSPITAL_COMMUNITY): Payer: Medicare Other | Admitting: Speech Pathology

## 2015-01-25 LAB — CBC WITH DIFFERENTIAL/PLATELET
BASOS ABS: 0 10*3/uL (ref 0.0–0.1)
BASOS PCT: 1 %
EOS PCT: 2 %
Eosinophils Absolute: 0.2 10*3/uL (ref 0.0–0.7)
HCT: 39.9 % (ref 36.0–46.0)
Hemoglobin: 13.3 g/dL (ref 12.0–15.0)
Lymphocytes Relative: 27 %
Lymphs Abs: 2.4 10*3/uL (ref 0.7–4.0)
MCH: 30.2 pg (ref 26.0–34.0)
MCHC: 33.3 g/dL (ref 30.0–36.0)
MCV: 90.5 fL (ref 78.0–100.0)
MONO ABS: 0.9 10*3/uL (ref 0.1–1.0)
Monocytes Relative: 10 %
Neutro Abs: 5.4 10*3/uL (ref 1.7–7.7)
Neutrophils Relative %: 60 %
PLATELETS: 251 10*3/uL (ref 150–400)
RBC: 4.41 MIL/uL (ref 3.87–5.11)
RDW: 14.5 % (ref 11.5–15.5)
WBC: 8.8 10*3/uL (ref 4.0–10.5)

## 2015-01-25 LAB — GLUCOSE, CAPILLARY
GLUCOSE-CAPILLARY: 121 mg/dL — AB (ref 65–99)
GLUCOSE-CAPILLARY: 155 mg/dL — AB (ref 65–99)
Glucose-Capillary: 134 mg/dL — ABNORMAL HIGH (ref 65–99)
Glucose-Capillary: 84 mg/dL (ref 65–99)

## 2015-01-25 LAB — BASIC METABOLIC PANEL
ANION GAP: 10 (ref 5–15)
BUN: 22 mg/dL — ABNORMAL HIGH (ref 6–20)
CALCIUM: 8.8 mg/dL — AB (ref 8.9–10.3)
CO2: 25 mmol/L (ref 22–32)
CREATININE: 0.66 mg/dL (ref 0.44–1.00)
Chloride: 106 mmol/L (ref 101–111)
GLUCOSE: 150 mg/dL — AB (ref 65–99)
Potassium: 3.6 mmol/L (ref 3.5–5.1)
Sodium: 141 mmol/L (ref 135–145)

## 2015-01-25 NOTE — Progress Notes (Signed)
Occupational Therapy Session Note  Patient Details  Name: Valerie Pham MRN: [id number] Date of Birth: Aug 21, 1927  Today's Date: 01/25/2015 OT Individual Time: [id number] OT Individual Time Calculation (min): 90 min    Short Term Goals: Week 1:  OT Short Term Goal 1 (Week 1): Pt will complete LB dressing with max assist OT Short Term Goal 2 (Week 1): Pt will complete bathing with mod assist OT Short Term Goal 3 (Week 1): Pt will complete toilet transfers with min assist OT Short Term Goal 4 (Week 1): Pt will complete 2 grooming tasks in standing with min assist  Skilled Therapeutic Interventions/Progress Updates:    Treatment session with focus on ADL retraining, sit > stand, LUE NMR, and overall activity tolerance.  Pt propelling w/c towards bathroom upon therapist arrival, reports needing to toilet.  Performed stand pivot transfer to toilet with min/steady assist and use of grab bars.  Pt required steady assist when completing clothing management and hygiene, with assist to pull pants over hips after toileting.  Mod encouragement to participate in bathing at sink level and cues for thoroughness and use of soap.  Supervision - min/steady assist with sit > stand and standing balance with UE support.  Pt able to pull pants over hips in standing at sink with alternating UE support.  Pt required multiple rest breaks throughout self-care tasks.  Engaged in LUE NMR in sitting with focus on forced use, as pt would tend to neglect LUE during self-care tasks this session.  Utilized theraputty for strengthening and [facility] as well as attention to Lt hand.  Gross motor and fine motor control with small animal figurines as well as reaching to retrieve items to facilitate weight shifting to Lt and increased ROM.  BUE activity with therapy ball with focus on symmetrical movements.  Pt required rest breaks throughout session.    Therapy Documentation Precautions:  Precautions Precautions:  Fall Restrictions Weight Bearing Restrictions: No General:   Vital Signs: Therapy Vitals Pulse Rate: 63 BP: 130/75 mmHg Patient Position (if appropriate): Sitting Pain:  Pt with no c/o pain  See Function Navigator for Current Functional Status.   Therapy/Group: Individual Therapy  Rosalio LoudHOXIE, Tina Gruner 01/25/2015, 12:22 PM

## 2015-01-25 NOTE — Progress Notes (Signed)
[facility] PHYSICAL MEDICINE & REHABILITATION     PROGRESS NOTE    Subjective/Complaints:  Pt states finger is doing better, no bowel or bladder issues  ROS: Denies CP, SOB, N/V/D  Objective: Vital Signs: Blood pressure 144/95, pulse 65, temperature 97.8 F (36.6 C), temperature source Oral, resp. rate 16, weight 95.6 kg (210 lb 12.2 oz), SpO2 100 %. No results found.  Recent Labs  01/25/15 0524  WBC 8.8  HGB 13.3  HCT 39.9  PLT 251    Recent Labs  01/25/15 0524  NA 141  K 3.6  CL 106  GLUCOSE 150*  BUN 22*  CREATININE 0.66  CALCIUM 8.8*   CBG (last 3)   Recent Labs  01/24/15 1639 01/24/15 2047 01/25/15 0646  GLUCAP 82 118* 134*    Wt Readings from Last 3 Encounters:  01/18/15 95.6 kg (210 lb 12.2 oz)  01/14/15 99 kg (218 lb 4.1 oz)    Physical Exam:  BP 144/95 mmHg  Pulse 65  Temp(Src) 97.8 F (36.6 C) (Oral)  Resp 16  Wt 95.6 kg (210 lb 12.2 oz)  SpO2 100% Constitutional: She appears well-developed and well-nourished. Vital signs reviewed HENT: Normocephalic and atraumatic. Conjunctivae and EOM are normal.  Neck: Normal range of motion. No thyromegaly present.  Cardiovascular: Normal rate and regular rhythm.  Systolic Murmur heard. Respiratory: Effort normal and breath sounds normal. No respiratory distress.  GI: Soft. Bowel sounds are normal. She exhibits no distension.  Musculoskeletal: + Edema and TTP over second digit of left upper extremity (improving)-centered at PIP Neurological: She is alert and oriented.  Follows commands.  Fair awareness of deficits Left hemifacial weakness, speech dysarthric Motor: LUE: Shoulder abduction 3/5, elbow flexion and extension 4-/5, finger grip 3+/5  limited movement in first finger due to swelling LLE: Hip flexion 3/5, , ke 3+, ankle dorsi/plantar flexion 3+/5 RUE/RLE: 5/5 prox to distal.  Skin: Skin is warm and dry.  Psychiatric: She has a normal mood and affect. Her behavior is normal.  Reasonable insight and awareness  Assessment/Plan: 1. 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 Physiatrist is providing close team supervision and 24 hour management of active medical problems listed below. Physiatrist and rehab team continue to assess barriers to discharge/monitor patient progress toward functional and medical goals.  Function:  Bathing Bathing position Bathing activity did not occur: Refused Position: Wheelchair/chair at sink  Bathing parts Body parts bathed by patient: Left arm, Chest, Abdomen Body parts bathed by helper: Buttocks, Back, Right arm  Bathing assist Assist Level:  (Max assist)      Upper Body Dressing/Undressing Upper body dressing   What is the patient wearing?: Bra, Pull over shirt/dress Bra - Perfomed by patient: Thread/unthread right bra strap, Thread/unthread left bra strap Bra - Perfomed by helper: Hook/unhook bra (pull down sports bra) Pull over shirt/dress - Perfomed by patient: Pull shirt over trunk, Put head through opening, Thread/unthread left sleeve, Thread/unthread right sleeve Pull over shirt/dress - Perfomed by helper: Pull shirt over trunk        Upper body assist Assist Level: Touching or steadying assistance(Pt > 75%)      Lower Body Dressing/Undressing Lower body dressing   What is the patient wearing?: Pants, Socks, Shoes, [employer] Station[employer]ed Hose     Pants- Performed by patient: Thread/unthread right pants leg, Thread/unthread left pants leg Pants- Performed by helper:  Pull pants up/down     Socks - Performed by patient: Don/doff right sock, Don/doff left sock Socks - Performed by helper: Don/doff right sock, Don/doff left sock Shoes - Performed by patient: Don/doff right shoe, Don/doff left shoe, Fasten right, Fasten left         TED Hose - Performed by helper: Don/doff  right TED hose, Don/doff left TED hose  Lower body assist Assist for lower body dressing: Set up      Toileting Toileting   Toileting steps completed by patient: Performs perineal hygiene, Adjust clothing prior to toileting Toileting steps completed by helper: Adjust clothing after toileting Toileting Assistive Devices: Grab bar or rail  Toileting assist Assist level: Touching or steadying assistance (Pt.75%)   Transfers Chair/bed transfer   Chair/bed transfer method: Squat pivot Chair/bed transfer assist level: Touching or steadying assistance (Pt > 75%) Chair/bed transfer assistive device: Armrests Mechanical lift: Landscape architect     Max distance: 20 Assist level: 2 helpers (WC close follow)   Wheelchair   Type: Manual Max wheelchair distance: 50 Assist Level: Touching or steadying assistance (Pt > 75%)  Cognition Comprehension Comprehension assist level: Understands basic 90% of the time/cues < 10% of the time  Expression Expression assist level: Expresses basic 75 - 89% of the time/requires cueing 10 - 24% of the time. Needs helper to occlude trach/needs to repeat words.  Social Interaction Social Interaction assist level: Interacts appropriately 90% of the time - Needs monitoring or encouragement for participation or interaction.  Problem Solving Problem solving assist level: Solves basic 75 - 89% of the time/requires cueing 10 - 24% of the time  Memory Memory assist level: Recognizes or recalls 50 - 74% of the time/requires cueing 25 - 49% of the time    Medical Problem List and Plan: 1.Left side weakness with gait instability secondary to right pontine infarct felt to be embolic  2.DVT Prophylaxis/Anticoagulation: Eliquis 3. Pain Management: Tylenol as needed 4. Hypertension. Lopressor 50 mg twice a day. Monitor with increased mobility  Amlodipine  started 12/21- May need to increase to   Losartan increased to 50 on 12/24, increased to 100  12/26(home dose 100) , 144/95 today   5. Neuropsych: This patient is capable of making decisions on her own behalf. 6. Skin/Wound Care: Routine skin checks 7. Fluids/Electrolytes/Nutrition: Routine I&O   BMP looks ok  CBC ok 8. Diabetes mellitus with peripheral neuropathy. Hemoglobin A1c 7.6. Check blood sugars before meals and at bedtime.   Patient on Glucotrol 10 mg daily and glucophage 500 mg twice daily at home.  Restarted her medications on 12/21, will cont to monitor and increase   CBGs , fasting BG 150  Diabetic teaching 9. Hyperlipidemia. Lipitor 10. Hypothyroidism. Synthroid 11. UTI. Urine culture multi-species. Cipro discontinued.  Repeat urine culture showing, E. Coli, however, pt asymptomatic (possibly contaminant).  Will cont to monitor and consider treatment if pt becomes symptomatic  12. Gout: Pt with hx and swelling of 2nd digit LUE.  Xray reviewed from 12/20.   Ultrasound reviewed, Uric acid elevated  Cont Colchicine  Still with reduced ROM    LOS (Days) 7 A FACE TO FACE EVALUATION WAS PERFORMED  Dantonio Justen E 01/25/2015 7:03 AM

## 2015-01-25 NOTE — Progress Notes (Signed)
Speech Language Pathology Daily Session Note  Patient Details  Name: Valerie Pham MRN: [id number] Date of Birth: 1927/05/11  Today's Date: 01/25/2015 SLP Individual Time: [id number] SLP Individual Time Calculation (min): 41 min  Short Term Goals: Week 1: SLP Short Term Goal 1 (Week 1): Pt will achieve intelligibility in conversations with supervision cues over 3 targeted sessions.   SLP Short Term Goal 2 (Week 1): Pt will utilize compensatory dysarthria strategies in conversations wtih supervision cues.    Skilled Therapeutic Interventions:  Pt was seen for skilled ST targeting communication goals.  SLP facilitated the session with a verbal reasoning task in a mildly distracting environment to address speech intelligibility in conversations.  Pt was>90% intelligible in conversations with mod I.  Although pt is fully intelligible, she continues to report feeling unhappy with the way her speech sounds and reports that she still sounds "slurred."  Pt was returned to room and left in wheelchair with daughter present.  Continue per current plan of care.    Function:  Eating Eating   Modified Consistency Diet: No Eating Assist Level: Set up assist for           Cognition Comprehension Comprehension assist level: Understands basic 90% of the time/cues < 10% of the time  Expression   Expression assist level: Expresses basic 75 - 89% of the time/requires cueing 10 - 24% of the time. Needs helper to occlude trach/needs to repeat words.  Social Interaction Social Interaction assist level: Interacts appropriately 90% of the time - Needs monitoring or encouragement for participation or interaction.  Problem Solving Problem solving assist level: Solves basic 75 - 89% of the time/requires cueing 10 - 24% of the time  Memory Memory assist level: Recognizes or recalls 50 - 74% of the time/requires cueing 25 - 49% of the time    Pain Pain Assessment Pain Assessment: No/denies  pain  Therapy/Group: Individual Therapy  Diann Bangerter, Melanee SpryNicole L 01/25/2015, 3:42 PM

## 2015-01-25 NOTE — Progress Notes (Signed)
Physical Therapy Session Note  Patient Details  Name: Valerie MangoSara H Grose MRN: [id number] Date of Birth: 05/31/1927  Today's Date: 01/25/2015 PT Individual Time: 0830-0928 PT Individual Time Calculation (min): 58 min   Short Term Goals: Week 1:  PT Short Term Goal 1 (Week 1): Pt will perfor bed mobility S and min verbal cues PT Short Term Goal 2 (Week 1): Pt will perform sit <>stand with consistent minA PT Short Term Goal 3 (Week 1): Pt will perform stand pivot transfer close S with RW PT Short Term Goal 4 (Week 1): Pt will perform gait x50' with LRAD and modA PT Short Term Goal 5 (Week 1): Pt will perform ascent/descent of 8 3-inch stairs with modA  Skilled Therapeutic Interventions/Progress Updates:   Session focused on sit to stands and standing balance EOB with 1 UE and no UE support for pulling up pants and underwear to prepare for OOB (pt needed assist to thread pants and underwear) but maintained balance with supervision, functional transfer training for stand pivot and squat pivot technique with cues for RW placement during standing transfers and to keep body inside of RW, w/c propulsion with BUE and RLE for functional strengthening and endurance as well as functional use of LUE at supervision level, gait training with RW with cues for step length, posture, and positioning of RW with min assist x 20', x 10', and x 10', stair negotiation training for forced use of LLE as well preparing for home entry x 3 steps with min assist with rails, and neuro re-ed for LE coordination and motor control for alternating toe taps with up to mod assist x 10 reps each with facilitation for weightshifting and balance.   Therapy Documentation Precautions:  Precautions Precautions: Fall Restrictions Weight Bearing Restrictions: No  Pain:  Denies pain.   See Function Navigator for Current Functional Status.   Therapy/Group: Individual Therapy  Karolee StampsGray, Brodin Gelpi [facility] 01/25/2015, 9:29 AM

## 2015-01-25 NOTE — Progress Notes (Signed)
Pt went to the bathroom and voided 8 times throughout the night. Urine is malodorous. No complaints of pain while voiding and urine is yellow/clear. Will continue to monitor.Valerie MeyerEurillo, Rilley Poulter A, RN

## 2015-01-26 ENCOUNTER — Inpatient Hospital Stay (HOSPITAL_COMMUNITY): Payer: Medicare Other | Admitting: Occupational Therapy

## 2015-01-26 ENCOUNTER — Inpatient Hospital Stay (HOSPITAL_COMMUNITY): Payer: Medicare Other | Admitting: *Deleted

## 2015-01-26 ENCOUNTER — Inpatient Hospital Stay (HOSPITAL_COMMUNITY): Payer: Medicare Other | Admitting: Physical Therapy

## 2015-01-26 ENCOUNTER — Inpatient Hospital Stay (HOSPITAL_COMMUNITY): Payer: Medicare Other | Admitting: Speech Pathology

## 2015-01-26 DIAGNOSIS — M10242 Drug-induced gout, left hand: Secondary | ICD-10-CM

## 2015-01-26 LAB — GLUCOSE, CAPILLARY
GLUCOSE-CAPILLARY: 138 mg/dL — AB (ref 65–99)
GLUCOSE-CAPILLARY: 111 mg/dL — AB (ref 65–99)
Glucose-Capillary: 133 mg/dL — ABNORMAL HIGH (ref 65–99)
Glucose-Capillary: 108 mg/dL — ABNORMAL HIGH (ref 65–99)

## 2015-01-26 MED ORDER — AMLODIPINE BESYLATE 10 MG PO TABS
10.0000 mg | ORAL_TABLET | Freq: Every day | ORAL | Status: DC
  Administered 2015-01-27 – 2015-02-03 (×8): 10 mg via ORAL
  Filled 2015-01-26 (×8): qty 1

## 2015-01-26 NOTE — Progress Notes (Signed)
Occupational Therapy Session Note  Patient Details  Name: Valerie Pham MRN: [id number] Date of Birth: 08-15-1927  Today's Date: 01/26/2015 OT Individual Time: 1030-1100 and 1430-1500 OT Individual Time Calculation (min): 30 min and 30 min   Short Term Goals: Week 1:  OT Short Term Goal 1 (Week 1): Pt will complete LB dressing with max assist OT Short Term Goal 2 (Week 1): Pt will complete bathing with mod assist OT Short Term Goal 3 (Week 1): Pt will complete toilet transfers with min assist OT Short Term Goal 4 (Week 1): Pt will complete 2 grooming tasks in standing with min assist  Skilled Therapeutic Interventions/Progress Updates:    1) Treatment session with focus on sit > stand, standing balance, functional use of LUE, and reaction time.  Engaged in [employer] in standing with focus on divided attention and use of BUE.  Pt utilized RW to steady self in standing, but able to progress to alternating between one UE support and no UE support with min guard in standing.  Average reaction time 2.59 seconds with three trials of Mode A. Increased challenge to 2 second time limit with pt able to complete 17/34 with average reaction time of 1.47 and 18/34 with average time of 1.46 with pt requiring increased time to locate and press lights on Lt with LUE.  2) Treatment session with focus on functional transfers and family education with pt's two daughters.  Engaged in tub/shower transfer in ADL apt with RW.  Provided min assist with ambulation due to LLE weakness, provided cues for sequencing with transfer.  Recommend use of tub transfer bench for safety with transfer.  Pt's daughters asking multiple questions regarding d/c plan and recommended 24 hr supervision.  Discussed current level of care and projected goals and rationale for recommendation of supervision.  Pt ambulated 88 feet with RW and min assist, with pt demonstrating decreased LLE stability requiring min tactile cues for weight shifting  and stability.  Therapy Documentation Precautions:  Precautions Precautions: Fall Restrictions Weight Bearing Restrictions: No Pain: Pain Assessment Pain Assessment: No/denies pain  See Function Navigator for Current Functional Status.   Therapy/Group: Individual Therapy  Rosalio LoudHOXIE, Terril Amaro 01/26/2015, 12:22 PM

## 2015-01-26 NOTE — Progress Notes (Signed)
Physical Therapy Session Note  Patient Details  Name: Valerie Pham MRN: [id number] Date of Birth: Dec 16, 1927  Today's Date: 01/26/2015 PT Individual Time: 1300-1400 PT Individual Time Calculation (min): 60 min   Short Term Goals: Week 1:  PT Short Term Goal 1 (Week 1): Pt will perfor bed mobility S and min verbal cues PT Short Term Goal 1 - Progress (Week 1): Not met PT Short Term Goal 2 (Week 1): Pt will perform sit <>stand with consistent minA PT Short Term Goal 2 - Progress (Week 1): Met PT Short Term Goal 3 (Week 1): Pt will perform stand pivot transfer close S with RW PT Short Term Goal 3 - Progress (Week 1): Not met PT Short Term Goal 4 (Week 1): Pt will perform gait x50' with LRAD and modA PT Short Term Goal 4 - Progress (Week 1): Met PT Short Term Goal 5 (Week 1): Pt will perform ascent/descent of 8 3-inch stairs with modA PT Short Term Goal 5 - Progress (Week 1): Met  Skilled Therapeutic Interventions/Progress Updates:    Pt received up in w/c with many family/friends present, agreeable to PT session. W/C Management - PT instructs pt in legrest doffing req supervision and slight hand over hand assist to locate release lever. PT instructs pt in w/c propulsion with B UEs and repeated cues to use L UE req up to mod A x 130'. Therapeutic Exercise - PT instructs pt in abdominal/core strengthening doing modified seated abdominal crunches - reaching forward from back against w/c backrest 2 x 10 reps with arms ~90 degrees. After changing clothes, PT places pt on nu-step at L3 and instructs pt in B UE/LE strengthening exercise x 8 minutes total with rest breaks taken prn, cues for pt to aduct/IR L knee. Gait Training - PT instructs pt in ambulation with RW x 55' req min A for balance. Therapeutic Activity - Upon standing for gait, PT notices that pt has been incontinent of urine and it has soaked through her pants. PT brings pt back to her room, assists in min A transfer onto toilet, where PT  gives her a wet washcloth with no-rinse santizer, instructs her in B thigh and bottom wipe down, then provides max A for LB dressing into pants and brief, and set up assist to don mock turtleneck. Pt ended up in w/c with quick release belt in place and all needs in reach. Continue per PT POC.   Therapy Documentation Precautions:  Precautions Precautions: Fall Restrictions Weight Bearing Restrictions: No  Pain: Pain Assessment Pain Assessment: No/denies pain   See Function Navigator for Current Functional Status.   Therapy/Group: Individual Therapy  Shalisa Mcquade M 01/26/2015, 1:15 PM

## 2015-01-26 NOTE — Progress Notes (Signed)
Speech Language Pathology Weekly Progress and Session Note  Patient Details  Name: Valerie Pham MRN: [id number] Date of Birth: March 18, 1927  Beginning of progress report period: January 19, 2015   End of progress report period: January 26, 2015   Today's Date: 01/26/2015 SLP Individual Time: 1000-1030 SLP Individual Time Calculation (min): 30 min  Short Term Goals: Week 1: SLP Short Term Goal 1 (Week 1): Pt will achieve intelligibility in conversations with supervision cues over 3 targeted sessions.   SLP Short Term Goal 1 - Progress (Week 1): Met SLP Short Term Goal 2 (Week 1): Pt will utilize compensatory dysarthria strategies in conversations wtih supervision cues.   SLP Short Term Goal 2 - Progress (Week 1): Met    New Short Term Goals: Week 2: SLP Short Term Goal 1 (Week 2): Pt will achieve intelligibility in conversations with mod I over 3 consecutive sessions.    Weekly Progress Updates:  Pt made functional gains this reporting period and has met 2 out of 2 short term goals.  Overall, pt requires supervision cues for intelligibility in conversations but has demonstrated intermittent ability for mod I speech intelligibility.  Pt is quickly approaching long term goals and may not need ST follow up at discharge but would benefit from ongoing ST while inpatient to maximize independence for communication prior to discharge.  Pt and family education is ongoing.     Intensity: Minumum of 1-2 x/day, 30 to 90 minutes Frequency: 1 to 3 out of 7 days Duration/Length of Stay: 14-17 days  Treatment/Interventions: Cueing hierarchy;Environmental controls;Functional tasks;Patient/family education;Internal/external aids;Speech/Language facilitation   Daily Session  Skilled Therapeutic Interventions: Pt was seen for skilled ST targeting communication goals.  SLP facilitated the session with a picture description task targeting intelligibility in conversations.  Pt fluctuated between  supervision and mod I during the abovementioned task for intelligibility depending on pt's distraction to the environment.  Pt was returned to room and left with call bell within reach.  Goals updated on this date to reflect progress and current plan of care.      Function:   Eating Eating                 Cognition Comprehension Comprehension assist level: Follows basic conversation/direction with extra time/assistive device  Expression   Expression assist level: Expresses basic needs/ideas: With extra time/assistive device  Social Interaction Social Interaction assist level: Interacts appropriately 90% of the time - Needs monitoring or encouragement for participation or interaction.  Problem Solving Problem solving assist level: Solves basic 75 - 89% of the time/requires cueing 10 - 24% of the time  Memory Memory assist level: Recognizes or recalls 50 - 74% of the time/requires cueing 25 - 49% of the time   General    Pain Pain Assessment Pain Assessment: No/denies pain  Therapy/Group: Individual Therapy  Valerie Pham, Selinda Orion 01/26/2015, 12:25 PM

## 2015-01-26 NOTE — Progress Notes (Signed)
[facility] PHYSICAL MEDICINE & REHABILITATION     PROGRESS NOTE    Subjective/Complaints:  Elevated BP this am, denies HA, still has problems bending the left index and little fingers  ROS: Denies CP, SOB, N/V/D  Objective: Vital Signs: Blood pressure 195/85, pulse 69, temperature 98.2 F (36.8 C), temperature source Oral, resp. rate 18, weight 97.1 kg (214 lb 1.1 oz), SpO2 98 %. No results found.  Recent Labs  01/25/15 0524  WBC 8.8  HGB 13.3  HCT 39.9  PLT 251    Recent Labs  01/25/15 0524  NA 141  K 3.6  CL 106  GLUCOSE 150*  BUN 22*  CREATININE 0.66  CALCIUM 8.8*   CBG (last 3)   Recent Labs  01/25/15 1626 01/25/15 2205 01/26/15 0640  GLUCAP 155* 84 138*    Wt Readings from Last 3 Encounters:  01/25/15 97.1 kg (214 lb 1.1 oz)  01/14/15 99 kg (218 lb 4.1 oz)    Physical Exam:  BP 195/85 mmHg  Pulse 69  Temp(Src) 98.2 F (36.8 C) (Oral)  Resp 18  Wt 97.1 kg (214 lb 1.1 oz)  SpO2 98% Constitutional: She appears well-developed and well-nourished. Vital signs reviewed HENT: Normocephalic and atraumatic. Conjunctivae and EOM are normal.  Neck: Normal range of motion. No thyromegaly present.  Cardiovascular: Normal rate and regular rhythm.  Systolic Murmur heard. Respiratory: Effort normal and breath sounds normal. No respiratory distress.  GI: Soft. Bowel sounds are normal. She exhibits no distension.  Musculoskeletal: + Edema but no TTP over second and fifth  digits of left upper extremity (improving)-centered at PIP Neurological: She is alert and oriented.  Follows commands.  Fair awareness of deficits Left hemifacial weakness, speech dysarthric Motor: LUE: Shoulder abduction 3/5, elbow flexion and extension 4-/5, finger grip 3+/5  limited movement in first finger due to swelling LLE: Hip flexion 3/5, , ke 3+, ankle dorsi/plantar flexion 3+/5 RUE/RLE: 5/5 prox to distal.  Skin: Skin is warm and dry.  Psychiatric: She has a normal mood  and affect. Her behavior is normal. Reasonable insight and awareness  Assessment/Plan: 1. Functional deficits secondary to right pontine infarct , embolic, causing left hemiparesis  which require 3+ hours per day of interdisciplinary therapy in a comprehensive inpatient rehab setting. Physiatrist is providing close team supervision and 24 hour management of active medical problems listed below. Physiatrist and rehab team continue to assess barriers to discharge/monitor patient progress toward functional and medical goals.  Function:  Bathing Bathing position Bathing activity did not occur: Refused Position: Wheelchair/chair at sink  Bathing parts Body parts bathed by patient: Right arm, Left arm, Chest, Abdomen, Front perineal area, Buttocks (declined LB bathing) Body parts bathed by helper: Back  Bathing assist Assist Level:  (Mod assist)      Upper Body Dressing/Undressing Upper body dressing   What is the patient wearing?: Bra, Pull over shirt/dress Bra - Perfomed by patient: Thread/unthread right bra strap, Thread/unthread left bra strap Bra - Perfomed by helper: Hook/unhook bra (pull down sports bra) Pull over shirt/dress - Perfomed by patient: Pull shirt over trunk, Put head through opening, Thread/unthread left sleeve, Thread/unthread right sleeve Pull over shirt/dress - Perfomed by helper: Pull shirt over trunk        Upper body assist Assist Level: Touching or steadying assistance(Pt > 75%)      Lower Body Dressing/Undressing Lower body dressing   What is the patient wearing?: Shoes, Ted Hose, Pants     Pants- Performed by patient:  Pull pants up/down Pants- Performed by helper: Pull pants up/down     Socks - Performed by patient: Don/doff right sock, Don/doff left sock Socks - Performed by helper: Don/doff right sock, Don/doff left sock Shoes - Performed by patient: Don/doff right shoe, Don/doff left shoe, Fasten right Shoes - Performed by helper: Fasten left        TED Hose - Performed by helper: Don/doff right TED hose, Don/doff left TED hose  Lower body assist Assist for lower body dressing: Touching or steadying assistance (Pt > 75%)      Toileting Toileting Toileting activity did not occur: No continent bowel/bladder event Toileting steps completed by patient: Adjust clothing prior to toileting, Performs perineal hygiene Toileting steps completed by helper: Adjust clothing after toileting Toileting Assistive Devices: Grab bar or rail  Toileting assist Assist level:  (Mod assist)   Transfers Chair/bed transfer   Chair/bed transfer method: Squat pivot Chair/bed transfer assist level: Touching or steadying assistance (Pt > 75%) Chair/bed transfer assistive device: Armrests Mechanical lift: Ecologist     Max distance: 20 Assist level: Touching or steadying assistance (Pt > 75%)   Wheelchair   Type: Manual Max wheelchair distance: 120 Assist Level: Supervision or verbal cues  Cognition Comprehension Comprehension assist level: Understands basic 90% of the time/cues < 10% of the time  Expression Expression assist level: Expresses basic 75 - 89% of the time/requires cueing 10 - 24% of the time. Needs helper to occlude trach/needs to repeat words.  Social Interaction Social Interaction assist level: Interacts appropriately 90% of the time - Needs monitoring or encouragement for participation or interaction.  Problem Solving Problem solving assist level: Solves basic 75 - 89% of the time/requires cueing 10 - 24% of the time  Memory Memory assist level: Recognizes or recalls 50 - 74% of the time/requires cueing 25 - 49% of the time    Medical Problem List and Plan: 1.Left side weakness with gait instability secondary to right pontine infarct felt to be embolic Team conference today please see physician documentation under team conference tab, met with team face-to-face to discuss problems,progress, and goals. Formulized  individual treatment plan based on medical history, underlying problem and comorbidities. 2.DVT Prophylaxis/Anticoagulation: Eliquis 3. Pain Management: Tylenol as needed 4. Hypertension. Lopressor 50 mg twice a day. Monitor with increased mobility  Amlodipine 81m started 12/21-  increase to 165m Losartan increased to 50 on 12/24, increased to 100 12/26(home dose 100) , 195/85 today   5. Neuropsych: This patient is capable of making decisions on her own behalf. 6. Skin/Wound Care: Routine skin checks 7. Fluids/Electrolytes/Nutrition: Routine I&O   BMP looks ok  CBC ok 8. Diabetes mellitus with peripheral neuropathy. Hemoglobin A1c 7.6. Check blood sugars before meals and at bedtime.   Patient on Glucotrol 10 mg daily and glucophage 500 mg twice daily at home.  Restarted her medications on 12/21, will cont to monitor and increase   CBGs , fasting BG 138  Diabetic teaching 9. Hyperlipidemia. Lipitor 10. Hypothyroidism. Synthroid 11. UTI. Urine culture multi-species. Cipro discontinued.  Repeat urine culture showing, E. Coli, however, pt asymptomatic (possibly contaminant).  Will cont to monitor and consider treatment if pt becomes symptomatic  12. Gout: Pt with hx and swelling of 2nd digit LUE.  Xray reviewed from 12/20.   Ultrasound reviewed, Uric acid elevated  Cont Colchicine  OT to work with ROM as acute inflammation is subsiding   LOS (Days) 8 A FACE TO FACE EVALUATION WAS  PERFORMED  Charlett Blake 01/26/2015 7:19 AM

## 2015-01-26 NOTE — Progress Notes (Signed)
Physical Therapy Weekly Progress Note  Patient Details  Name: JALEIGHA DEANE MRN: [id number] Date of Birth: [birth year]  Beginning of progress report period: January 18, 2015 End of progress report period: January 26, 2015  Today's Date: 01/26/2015 PT Individual Time: 0800-0859 PT Individual Time Calculation (min): 59 min   Patient has met 3 of 5 short term goals.    Patient continues to demonstrate the following deficits: cardiovascular endurance, weakness, decreased balance, insight into deficits and therefore will continue to benefit from skilled PT intervention to enhance overall performance with activity tolerance, balance, postural control, functional use of  right upper extremity and right lower extremity, attention, awareness and coordination.  Patient progressing toward long term goals.  Continue plan of care.  PT Short Term Goals Week 2:  PT Short Term Goal 1 (Week 2): Patient will perform bed mobility with supervision and min verbal cues for sequence and technique  PT Short Term Goal 2 (Week 2): Perform sit to and from stand transfer with close supervision  PT Short Term Goal 3 (Week 2): Patient will ambulate 100 feet with close supervision with use of rolling walker for household mobility.  PT Short Term Goal 4 (Week 2): Patient will negotiate 12 steps with bilateral handrails and min assist in order to enter and exit her home environment.   Skilled Therapeutic Interventions/Progress Updates:    Patient demonstrating progress with functional mobility and endurance. However patient is continually limited by decreased cardiovascular endurance and external distractions. Recommendation made for family training an observation patient in agreement with recommendation.   Therapy Documentation Precautions:  Precautions Precautions: Fall Restrictions Weight Bearing Restrictions: No General:    Bed mobility supine to short sit min assist with use of bed rail.  Sit to and from  stand transfer with RW min a Stand pivot trasfer with RW min assist.  Patient ambulated 75 feet, 50 feet, and 65 feet with RW min assist. Patient ambulated with a step through gait pattern. Patient demonstrates dreased step step length on LLE. Patient externally distractedby environment. Min assist for for balance and facilitation techniques to promote hip extension and weight shifts.   Patient up and down 12 steps with bilateral handrails min assist. Patient educated on proper sequence and technique. Patient required verbal cues throughout for proper sequence and technique.   Standing there ex with RW: Marching 30x with min assist Mini-squats 30x   Seated there ex: Hip adduction with ball squeeze 30x with a 5 second hold.   Patient tolerated tx well with frequent rest breaks throughout session. Patient returned to room at end of session with all needs met resting comfortably in chair at bedside.  Call bell within reach and patient educated not to be up without assistance. Patient verbalized understanding.    See Function Navigator for Current Functional Status.  Therapy/Group: Individual Therapy  [name] 01/26/2015, 10:02 AM

## 2015-01-27 ENCOUNTER — Inpatient Hospital Stay (HOSPITAL_COMMUNITY): Payer: Medicare Other | Admitting: Occupational Therapy

## 2015-01-27 ENCOUNTER — Ambulatory Visit (HOSPITAL_COMMUNITY): Payer: Medicare Other | Admitting: Physical Therapy

## 2015-01-27 LAB — GLUCOSE, CAPILLARY
GLUCOSE-CAPILLARY: 126 mg/dL — AB (ref 65–99)
GLUCOSE-CAPILLARY: 117 mg/dL — AB (ref 65–99)
Glucose-Capillary: 90 mg/dL (ref 65–99)
Glucose-Capillary: 126 mg/dL — ABNORMAL HIGH (ref 65–99)

## 2015-01-27 NOTE — Progress Notes (Signed)
Occupational Therapy Weekly Progress Note  Patient Details  Name: Valerie Pham MRN: [id number] Date of Birth: 02/14/1927  Beginning of progress report period: January 19, 2015 End of progress report period: January 27, 2015  Today's Date: 01/27/2015 OT Individual Time: 1000-1100 OT Individual Time Calculation (min): 60 min    Patient has met 3 of 4 short term goals.  Pt making steady progress towards goals.  Pt currently min assist short distance ambulation and functional transfers with RW due to LLE weakness and instability.  Min assist bathing and dressing at sit > stand level.  Pt's participation is limited due to decreased endurance and activity tolerance as well as LUE weakness.  Patient continues to demonstrate the following deficits: LUE/LLE weakness, decreased standing balance and tolerance, decreased activity tolerance and therefore will continue to benefit from skilled OT intervention to enhance overall performance with BADL and Reduce care partner burden.  Patient progressing toward long term goals..  Continue plan of care.  OT Short Term Goals Week 1:  OT Short Term Goal 1 (Week 1): Pt will complete LB dressing with max assist OT Short Term Goal 1 - Progress (Week 1): Met OT Short Term Goal 2 (Week 1): Pt will complete bathing with mod assist OT Short Term Goal 2 - Progress (Week 1): Met OT Short Term Goal 3 (Week 1): Pt will complete toilet transfers with min assist OT Short Term Goal 3 - Progress (Week 1): Met OT Short Term Goal 4 (Week 1): Pt will complete 2 grooming tasks in standing with min assist OT Short Term Goal 4 - Progress (Week 1): Progressing toward goal Week 2:  OT Short Term Goal 1 (Week 2): Pt will complete 2 grooming tasks in standing with min assist OT Short Term Goal 2 (Week 2): STG = LTGs due to remaining LOS  Skilled Therapeutic Interventions/Progress Updates:    ADL retraining with focus on family education and increased participation in  self-care tasks.  Pt's 2 daughters and son present for family education this session and to further discuss probability of 24 hr supervision vs additional rehab.  Discussed DME for bathroom with son who reports some modifications already in place and receptive to recommendations.  Tub/shower transfer in ADL apartment with pt ambulating to shower with min assist with RW, requiring assist to get LLE over tub ledge.  Supervision for all bathing tasks at sit > stand level in tub/shower.  Pt required assist with fastening bra and pulling pants over hips due to LUE weakness.  Pt donned and fastened shoes with setup.  Attempted to discuss supervision recommendations with pt and family members, with family verbalizing concerns with 24/7 recommendation.  Therapy Documentation Precautions:  Precautions Precautions: Fall Restrictions Weight Bearing Restrictions: No Pain:  Pt with no c/o pain  See Function Navigator for Current Functional Status.   Therapy/Group: Individual Therapy  [name], [name] 01/27/2015, 12:14 PM

## 2015-01-27 NOTE — Progress Notes (Signed)
[place] PHYSICAL MEDICINE & REHABILITATION     PROGRESS NOTE    Subjective/Complaints:  No bowel or bladder issues, no finger pain  ROS: Denies CP, SOB, N/V/D  Objective: Vital Signs: Blood pressure 164/70, pulse 69, temperature 98.5 F (36.9 C), temperature source Oral, resp. rate 18, weight 98.3 kg (216 lb 11.4 oz), SpO2 96 %. No results found.  Recent Labs  01/25/15 0524  WBC 8.8  HGB 13.3  HCT 39.9  PLT 251    Recent Labs  01/25/15 0524  NA 141  K 3.6  CL 106  GLUCOSE 150*  BUN 22*  CREATININE 0.66  CALCIUM 8.8*   CBG (last 3)   Recent Labs  01/26/15 1635 01/26/15 2124 01/27/15 0645  GLUCAP 108* 111* 126*    Wt Readings from Last 3 Encounters:  01/26/15 98.3 kg (216 lb 11.4 oz)  01/14/15 99 kg (218 lb 4.1 oz)    Physical Exam:  BP 164/70 mmHg  Pulse 69  Temp(Src) 98.5 F (36.9 C) (Oral)  Resp 18  Wt 98.3 kg (216 lb 11.4 oz)  SpO2 96% Constitutional: She appears well-developed and well-nourished. Vital signs reviewed HENT: Normocephalic and atraumatic. Conjunctivae and EOM are normal.  Neck: Normal range of motion. No thyromegaly present.  Cardiovascular: Normal rate and regular rhythm.  Systolic Murmur heard. Respiratory: Effort normal and breath sounds normal. No respiratory distress.  GI: Soft. Bowel sounds are normal. She exhibits no distension.  Musculoskeletal: + Edema but no TTP over second and fifth  digits of left upper extremity (improving)-centered at PIP Neurological: She is alert and oriented.  Follows commands.  Fair awareness of deficits Left hemifacial weakness, speech dysarthric Motor: LUE: Shoulder abduction 3/5, elbow flexion and extension 4-/5, finger grip 3+/5  limited movement in first finger due to swelling LLE: Hip flexion 3/5, , ke 3+, ankle dorsi/plantar flexion 3+/5 RUE/RLE: 5/5 prox to distal.  Skin: Skin is warm and dry.  Psychiatric: She has a normal mood and affect. Her behavior is normal.  Reasonable insight and awareness  Assessment/Plan: 1. Functional deficits secondary to right pontine infarct , embolic, causing left hemiparesis  which require 3+ hours per day of interdisciplinary therapy in a comprehensive inpatient rehab setting. Physiatrist is providing close team supervision and 24 hour management of active medical problems listed below. Physiatrist and rehab team continue to assess barriers to discharge/monitor patient progress toward functional and medical goals.  Function:  Bathing Bathing position Bathing activity did not occur: Refused Position: Wheelchair/chair at sink  Bathing parts Body parts bathed by patient: Right arm, Left arm, Chest, Abdomen, Front perineal area, Buttocks (declined LB bathing) Body parts bathed by helper: Back  Bathing assist Assist Level:  (Mod assist)      Upper Body Dressing/Undressing Upper body dressing   What is the patient wearing?: Bra, Pull over shirt/dress Bra - Perfomed by patient: Thread/unthread right bra strap, Thread/unthread left bra strap Bra - Perfomed by helper: Hook/unhook bra (pull down sports bra) Pull over shirt/dress - Perfomed by patient: Pull shirt over trunk, Put head through opening, Thread/unthread left sleeve, Thread/unthread right sleeve Pull over shirt/dress - Perfomed by helper: Pull shirt over trunk        Upper body assist Assist Level: Touching or steadying assistance(Pt > 75%)      Lower Body Dressing/Undressing Lower body dressing   What is the patient wearing?: Shoes, Ted Hose, Pants     Pants- Performed by patient: Pull pants up/down Pants- Performed by helper: Pull  pants up/down     Socks - Performed by patient: Don/doff right sock, Don/doff left sock Socks - Performed by helper: Don/doff right sock, Don/doff left sock Shoes - Performed by patient: Don/doff right shoe, Don/doff left shoe, Fasten right Shoes - Performed by helper: Fasten left       TED Hose - Performed by helper:  Don/doff right TED hose, Don/doff left TED hose  Lower body assist Assist for lower body dressing: Touching or steadying assistance (Pt > 75%)      Toileting Toileting Toileting activity did not occur: No continent bowel/bladder event Toileting steps completed by patient: Adjust clothing prior to toileting Toileting steps completed by helper: Performs perineal hygiene, Adjust clothing after toileting Toileting Assistive Devices: Grab bar or rail  Toileting assist Assist level: Touching or steadying assistance (Pt.75%)   Transfers Chair/bed transfer   Chair/bed transfer method: Stand pivot Chair/bed transfer assist level: Touching or steadying assistance (Pt > 75%) Chair/bed transfer assistive device: Armrests, Walker Mechanical lift: Ecologist     Max distance: 80' Assist level: Touching or steadying assistance (Pt > 75%)   Wheelchair   Type: Manual Max wheelchair distance: 130' Assist Level: Moderate assistance (Pt 50 - 74%)  Cognition Comprehension Comprehension assist level: Follows basic conversation/direction with no assist  Expression Expression assist level: Expresses basic needs/ideas: With no assist  Social Interaction Social Interaction assist level: Interacts appropriately 90% of the time - Needs monitoring or encouragement for participation or interaction.  Problem Solving Problem solving assist level: Solves basic 75 - 89% of the time/requires cueing 10 - 24% of the time  Memory Memory assist level: Recognizes or recalls 90% of the time/requires cueing < 10% of the time    Medical Problem List and Plan: 1.Left side weakness with gait instability secondary to right pontine infarct felt to be embolic Team conference today please see physician documentation under team conference tab, met with team face-to-face to discuss problems,progress, and goals. Formulized individual treatment plan based on medical history, underlying problem and  comorbidities. 2.DVT Prophylaxis/Anticoagulation: Eliquis 3. Pain Management: Tylenol as needed 4. Hypertension. Lopressor 50 mg twice a day. Monitor with increased mobility  Amlodipine 43m started 12/21-  increased to 152mon 12/28  Losartan increased to 50 on 12/24, increased to 100 12/26(home dose 100) , 164/70 today   5. Neuropsych: This patient is capable of making decisions on her own behalf. 6. Skin/Wound Care: Routine skin checks 7. Fluids/Electrolytes/Nutrition: Routine I&O   BMP looks ok  CBC ok 8. Diabetes mellitus with peripheral neuropathy. Hemoglobin A1c 7.6. Check blood sugars before meals and at bedtime.   Patient on Glucotrol 10 mg daily and glucophage 500 mg twice daily at home.  Restarted her medications on 12/21, will cont to monitor and increase   CBGs , fasting BG 126  Diabetic teaching 9. Hyperlipidemia. Lipitor 10. Hypothyroidism. Synthroid 11. UTI. Urine culture multi-species. Cipro discontinued.No symptoms of recurrence  12. Gout: Pt with hx and swelling of 2nd digit LUE.  Xray reviewed from 12/20.   Ultrasound reviewed, Uric acid elevated  Cont Colchicine- may need allopurinol if recurrent attack while on rehab  OT to work with ROM as acute inflammation is subsiding   LOS (Days) 9 [place] 01/27/2015 7:29 AM

## 2015-01-27 NOTE — Progress Notes (Signed)
Occupational Therapy Session Note  Patient Details  Name: Valerie MangoSara H Ell MRN: [id number] Date of Birth: 1927-10-10  Today's Date: 01/27/2015 OT Individual Time: [id number] OT Individual Time Calculation (min): 72 min    Short Term Goals: Week 2:  OT Short Term Goal 1 (Week 2): Pt will complete 2 grooming tasks in standing with min assist OT Short Term Goal 2 (Week 2): STG = LTGs due to remaining LOS  Skilled Therapeutic Interventions/Progress Updates:  Upon entering the room, pt in bathroom with RN present. Pt incontinent of bladder and unaware. OT assisted in changing depends while seated on commode. Pt requiring assistance for hygiene and clothing management after toileting. Pt ambulating short distance with min a and RW ~ 10' to wheelchair. Pt propelled wheelchair 100' with mod verbal cues for proper technique with B UEs and R LE. OT propelled wheelchair the rest of the way in order to conserve energy. Pt engaged in [facility] tasks with forced use of L hand. Pt utilizing hand to manipulate small items with 3/20 drops from hand. Pt also assisting to place small cards into calendar slots with L hand as well. Pt having difficulty managing cards in L hand for palmar translation. Pt returning to room at end of session with quick release belt donned and call bell within reach upon exiting the room.   Therapy Documentation Precautions:  Precautions Precautions: Fall Restrictions Weight Bearing Restrictions: No  See Function Navigator for Current Functional Status.   Therapy/Group: Individual Therapy  Lowella Gripittman, Jermaine Neuharth L 01/27/2015, 2:15 PM

## 2015-01-27 NOTE — Progress Notes (Signed)
Physical Therapy Session Note  Patient Details  Name: Valerie Pham MRN: [id number] Date of Birth: December 19, 1927  Today's Date: 01/27/2015 PT Individual Time: 1100-1205 PT Individual Time Calculation (min): 65 min   Short Term Goals: Week 2:  PT Short Term Goal 1 (Week 2): Patient will perform bed mobility with supervision and min verbal cues for sequence and technique  PT Short Term Goal 2 (Week 2): Perform sit to and from stand transfer with close supervision  PT Short Term Goal 3 (Week 2): Patient will ambulate 100 feet with close supervision with use of rolling walker for household mobility.  PT Short Term Goal 4 (Week 2): Patient will negotiate 12 steps with bilateral handrails and min assist in order to enter and exit her home environment.    Therapy Documentation Precautions:  Precautions Precautions: Fall Restrictions Weight Bearing Restrictions: No  Patient's two daughters and son present for family observation and hands on training. Discussion at length at beginning of session regarding current functional status, goals, plan of care and discharge recommendations on 24/7 supervision and assistance. Family verbalized understanding of recommendations and understood at this patient would require 24/7 supervision upon discharge. Family observed and were educated on transfers techniques, safe patient handling, gait training, stair negotiation, car transfer and proper verbal cues with associated activity. Daughter participated and performed hands-on training for transfers and gait at a min assist level. Daughter needed cues for proper technique as well as verbal cues to ensure proper hand placement, safety and rolling walker management. Recommendation at this time made to family for additional hands-on family training prior to be able to assist their mother independently. Daughters and Son in agreement with recommendation and verbalized understanding. Request for further family training to  take place on Friday 01/28/15 between 1pm and 3pm.  Family and patient in agreement with recommendation and verbalized understanding. Patient left left in room in high back chair with all needs met prior to exit with family present, quick release belt was not donned prior to exit with family present. Family educated at this time patient will require assistance from a therapist, nurse or nurse tech for mobility within the room. Requested family ask staff to engage quick release belt prior to exiting the room. Family verbalized understanding.    Sit to and from stand transfer with RW min a Stand pivot trasfer with RW min assist.  Patient ambulated 125 feet, 50 feet, and 75 feet with RW min assist. Patient ambulated with a step through gait pattern. Patient demonstrates dreased step step length on LLE. Patient externally distractedby environment. Min assist for for balance and facilitation techniques to promote hip extension and weight shifts.   Patient up and down 4 steps with bilateral handrails min assist. Patient educated on proper sequence and technique. Patient required verbal cues throughout for proper sequence and technique.   Standing there ex with RW: Heel raises 30x with min assist Mini-squats 15x   Patient performed car transfer with min assist and RW.    See Function Navigator for Current Functional Status.   Therapy/Group: Individual Therapy  [name] 01/27/2015, 12:12 PM

## 2015-01-27 NOTE — Patient Care Conference (Signed)
Inpatient RehabilitationTeam Conference and Plan of Care Update Date: 01/26/2015   Time: 11:00 AM    Patient Name: Valerie Pham      Medical Record Number: [id number]  Date of Birth: 1927/05/22 Sex: Female         Room/Bed: 4M02C/4M02C-01 Payor Info: Payor: [employer] MEDICARE / Plan: [employer] MEDICARE / Product Type: *No Product type* /    Admitting Diagnosis: R CVA  Admit Date/Time:  01/18/2015  7:29 PM Admission Comments: No comment available   Primary Diagnosis:  Right pontine cerebrovascular accident [facility]) Principal Problem: Right pontine cerebrovascular accident [facility])  Patient Active Problem List   Diagnosis Date Noted  . Inflammation of joint of finger   . Gout attack 01/19/2015  . Bacterial UTI   . Generalized OA   . DM type 2 with diabetic peripheral neuropathy (HCC)   . Hypomagnesemia   . Urinary tract infection, site not specified   . Hemiparesis affecting nondominant side as late effect of stroke (HCC)   . CVA (cerebral vascular accident) (HCC) 01/14/2015  . Essential hypertension 01/14/2015  . DM type 2 (diabetes mellitus, type 2) (HCC) 01/14/2015  . Hypothyroidism 01/14/2015  . Right pontine cerebrovascular accident [facility]) 01/14/2015    Expected Discharge Date: Expected Discharge Date: 02/03/15  Team Members Present: Physician leading conference: Dr. Claudette Laws Social Worker Present: Staci Acosta, LCSW Nurse Present: Chana Bode, RN PT Present: Katherine Mantle, PT OT Present: Rosalio Loud, OT SLP Present: Jackalyn Lombard, SLP PPS Coordinator present : Tora Duck, RN, CRRN     Current Status/Progress Goal Weekly Team Focus  Medical   BP uncontrolled, left hemi  Normotensive  adjust meds, monitor for hypotension   Bowel/Bladder   Continent of bowel and bladder. LBM 01/23/15  Pt to remain continent of bowel and bladder  Monitor   Swallow/Nutrition/ Hydration   Pt at baseline for mentation per pt and family         ADL's   mod assist overall   supervision overall  ADL retraining, sit > stand, standing balance, functional transfers, LUE NMR   Mobility   min to mod assist overall; decreased endurance limiting gait distance  S bed mobility, transfers, gait with LRAD, minA stairs  transfers, stairs, gait training neuro re-ed, safety awareness, strengthening, endurance, d/c planning   Communication   Mild dysarthria, min assist-supervision for speech intelligibility in conversations  mod I  increase independence for achieving intelligibiltiy in conversations   Safety/Cognition/ Behavioral Observations  Mentation is at baseline per family and pt report         Pain   No c/o pain  <3  Monitor for nonverbal cues of pain   Skin   Buttocks, pink but blanchable  CDI  Encourage turn q 2hrs    Rehab Goals Patient on target to meet rehab goals: Yes Rehab Goals Revised: none *See Care Plan and progress notes for long and short-term goals.  Barriers to Discharge: See above    Possible Resolutions to Barriers:  see above, cont colchicine for gout    Discharge Planning/Teaching Needs:  Pt/family made aware of pt's needs at d/c and family is meeitng to discuss pt's care and d/c plan.  They have also received family education with the therapists to see pt's progress.  Family to f/u with CSW re: d/c plan.  Pt's dtrs are here regularly and they and paid caregiver can participate in family education, as needed.   Team Discussion:  Other than MD increasing blood pressure  medication, pt is doing well medically.  OT is working with pt on bathing and dressing, as pt is still mod to max assist for lower body dressing and is hesitant to bathe.  Pt is working with speech on dysarthria, but is doing well cognitively.  Pt is progressing with gait distance with PT, but still gets distracted with the external environment and needs min assist.  Revisions to Treatment Plan:  none   Continued Need for Acute Rehabilitation Level of Care: The patient requires  daily medical management by a physician with specialized training in physical medicine and rehabilitation for the following conditions: Daily direction of a multidisciplinary physical rehabilitation program to ensure safe treatment while eliciting the highest outcome that is of practical value to the patient.: Yes Daily medical management of patient stability for increased activity during participation in an intensive rehabilitation regime.: Yes Daily analysis of laboratory values and/or radiology reports with any subsequent need for medication adjustment of medical intervention for : Other;Neurological problems  Valerie Pham, Valerie Pham 01/27/2015, 1:08 PM

## 2015-01-28 ENCOUNTER — Ambulatory Visit (HOSPITAL_COMMUNITY): Payer: Medicare Other | Admitting: Physical Therapy

## 2015-01-28 ENCOUNTER — Inpatient Hospital Stay (HOSPITAL_COMMUNITY): Payer: Medicare Other | Admitting: Occupational Therapy

## 2015-01-28 LAB — GLUCOSE, CAPILLARY
GLUCOSE-CAPILLARY: 128 mg/dL — AB (ref 65–99)
Glucose-Capillary: 109 mg/dL — ABNORMAL HIGH (ref 65–99)
Glucose-Capillary: 118 mg/dL — ABNORMAL HIGH (ref 65–99)
Glucose-Capillary: 101 mg/dL — ABNORMAL HIGH (ref 65–99)

## 2015-01-28 NOTE — Progress Notes (Signed)
PHYSICAL MEDICINE & REHABILITATION     PROGRESS NOTE    Subjective/Complaints:    ROS: Denies CP, SOB, N/V/D  Objective: Vital Signs: Blood pressure 172/80, pulse 74, temperature 98.2 F (36.8 C), temperature source Oral, resp. rate 18, weight 98.3 kg (216 lb 11.4 oz), SpO2 98 %. No results found. No results for input(s): WBC, HGB, HCT, PLT in the last 72 hours. No results for input(s): NA, K, CL, GLUCOSE, BUN, CREATININE, CALCIUM in the last 72 hours.  Invalid input(s): CO CBG (last 3)   Recent Labs  01/27/15 1210 01/27/15 1632 01/27/15 2054  GLUCAP 90 126* 117*    Wt Readings from Last 3 Encounters:  01/26/15 98.3 kg (216 lb 11.4 oz)  01/14/15 99 kg (218 lb 4.1 oz)    Physical Exam:  BP 172/80 mmHg  Pulse 74  Temp(Src) 98.2 F (36.8 C) (Oral)  Resp 18  Wt 98.3 kg (216 lb 11.4 oz)  SpO2 98% Constitutional: She appears well-developed and well-nourished. Vital signs reviewed HENT: Normocephalic and atraumatic. Conjunctivae and EOM are normal.  Neck: Normal range of motion. No thyromegaly present.  Cardiovascular: Normal rate and regular rhythm.  Systolic Murmur heard. Respiratory: Effort normal and breath sounds normal. No respiratory distress.  GI: Soft. Bowel sounds are normal. She exhibits no distension.  Musculoskeletal: + Edema but no TTP over second and fifth  digits of left upper extremity (improving)-centered at PIP Neurological: She is alert and oriented.  Follows commands.  Fair awareness of deficits Left hemifacial weakness, speech dysarthric Motor: LUE: Shoulder abduction 3/5, elbow flexion and extension 4-/5, finger grip 3+/5  limited movement in first finger due to swelling LLE: Hip flexion 3/5, , ke 3+, ankle dorsi/plantar flexion 3+/5 RUE/RLE: 5/5 prox to distal.  Skin: Skin is warm and dry.  Psychiatric: She has a normal mood and affect. Her behavior is normal. Reasonable insight and awareness  Assessment/Plan: 1.  Functional deficits secondary to right pontine infarct , embolic, causing left hemiparesis  which require 3+ hours per day of interdisciplinary therapy in a comprehensive inpatient rehab setting. Physiatrist is providing close team supervision and 24 hour management of active medical problems listed below. Physiatrist and rehab team continue to assess barriers to discharge/monitor patient progress toward functional and medical goals.  Function:  Bathing Bathing position Bathing activity did not occur: Refused Position: Shower  Bathing parts Body parts bathed by patient: Right arm, Left arm, Chest, Abdomen, Front perineal area, Buttocks, Right upper leg, Left upper leg, Right lower leg, Left lower leg Body parts bathed by helper: Back  Bathing assist Assist Level: Supervision or verbal cues      Upper Body Dressing/Undressing Upper body dressing   What is the patient wearing?: Bra, Pull over shirt/dress Bra - Perfomed by patient: Thread/unthread right bra strap, Thread/unthread left bra strap Bra - Perfomed by helper: Hook/unhook bra (pull down sports bra) Pull over shirt/dress - Perfomed by patient: Pull shirt over trunk, Put head through opening, Thread/unthread left sleeve, Thread/unthread right sleeve Pull over shirt/dress - Perfomed by helper: Pull shirt over trunk        Upper body assist Assist Level: Touching or steadying assistance(Pt > 75%)      Lower Body Dressing/Undressing Lower body dressing   What is the patient wearing?: Shoes, [employer], Pants, Socks     Pants- Performed by patient: Thread/unthread right pants leg, Thread/unthread left pants leg Pants- Performed by helper: Pull pants up/down     Socks - Performed  by patient: Don/doff right sock, Don/doff left sock Socks - Performed by helper: Don/doff right sock, Don/doff left sock Shoes - Performed by patient: Don/doff right shoe, Don/doff left shoe, Fasten right, Fasten left Shoes - Performed by helper: Fasten  left       TED Hose - Performed by helper: Don/doff right TED hose, Don/doff left TED hose  Lower body assist Assist for lower body dressing: Touching or steadying assistance (Pt > 75%)      Toileting Toileting Toileting activity did not occur: No continent bowel/bladder event Toileting steps completed by patient: Adjust clothing prior to toileting Toileting steps completed by helper: Performs perineal hygiene, Adjust clothing after toileting Toileting Assistive Devices: Grab bar or rail  Toileting assist Assist level:  (max A)   Transfers Chair/bed transfer   Chair/bed transfer method: Stand pivot Chair/bed transfer assist level: Touching or steadying assistance (Pt > 75%) Chair/bed transfer assistive device: Armrests, Walker Mechanical lift: Ecologist     Max distance: 52' Assist level: Touching or steadying assistance (Pt > 75%)   Wheelchair   Type: Manual Max wheelchair distance: 130' Assist Level: Moderate assistance (Pt 50 - 74%)  Cognition Comprehension Comprehension assist level: Follows basic conversation/direction with no assist  Expression Expression assist level: Expresses basic needs/ideas: With no assist  Social Interaction Social Interaction assist level: Interacts appropriately 90% of the time - Needs monitoring or encouragement for participation or interaction.  Problem Solving Problem solving assist level: Solves basic 75 - 89% of the time/requires cueing 10 - 24% of the time  Memory Memory assist level: Recognizes or recalls 90% of the time/requires cueing < 10% of the time    Medical Problem List and Plan: 1.Left side weakness with gait instability secondary to right pontine infarct felt to be embolic Team conference today please see physician documentation under team conference tab, met with team face-to-face to discuss problems,progress, and goals. Formulized individual treatment plan based on medical history, underlying problem and  comorbidities. 2.DVT Prophylaxis/Anticoagulation: Eliquis 3. Pain Management: Tylenol as needed 4. Hypertension. Lopressor 50 mg twice a day. Monitor with increased mobility  Amlodipine 63m started 12/21-  increased to 134mon 12/28  Losartan increased to 50 on 12/24, increased to 100 12/26(home dose 100) , 172/80 today, generally higher in am, check orthostatics   5. Neuropsych: This patient is capable of making decisions on her own behalf. 6. Skin/Wound Care: Routine skin checks 7. Fluids/Electrolytes/Nutrition: Routine I&O   75-100% meals 8. Diabetes mellitus with peripheral neuropathy. Hemoglobin A1c 7.6. Check blood sugars before meals and at bedtime.   Patient on Glucotrol 10 mg daily and glucophage 500 mg twice daily at home.  Restarted her medications on 12/21, will cont to monitor , good control currently   CBG (last 3)   Recent Labs  01/27/15 1632 01/27/15 2054 01/28/15 0639  GLUCAP 126* 117* 109*     Diabetic teaching 9. Hyperlipidemia. Lipitor 10. Hypothyroidism. Synthroid 11. UTI. Urine culture multi-species. Cipro discontinued.No symptoms of recurrence  12. Gout: Pt with hx and swelling of 2nd digit LUE.improved- D/C colchicine         LOS (Days) 10 A FACE TO FACE EVALUATION WAS PERFORMED  KICharlett Blake2/30/2016 6:49 AM

## 2015-01-28 NOTE — Progress Notes (Signed)
Social Work Patient ID: Valerie Pham, female   DOB: 1927/03/19, 81 y.o.   MRN: [id number]   Met with pt's dtrs, Valerie Pham and Valerie Pham, and cousin Valerie Pham on 01-26-15 to update them on team conference discussion.  Again met with Valerie Pham and Valerie Pham 01-27-15 to further discuss pt's plan for d/c.  Therapy team continues to recommend 24/7 supervision and CSW suggested that family come and go through therapies with pt so that they can see her progress and the areas she will need help with and what she can do for herself.  Family did so 01-27-15 and will come again today for family education from 1-3pm.  PT and OT both explained the need for supervision with some min assist for lower body tasks.  Family is unsure they can manage this at home.  CSW talked with them about SNF transfer at their request, but reminded them that pt may still need support when she comes home from a SNF so that they can prepare for that, as well.  CSW called to talk with insurance case manager who stated that pt has a SNF benefit and they we would need to find a SNF bed and have the facility seek insurance approval.  CSW informed dtr of this via voicemail and await their decision on how they want CSW to proceed.  CSW remains available to assist with d/c plan and CSW's co-workers, Valerie Pham and Valerie Pham will work with pt/family as needed 01-31-15 and 02-01-15 during this CSW's absence.

## 2015-01-28 NOTE — Progress Notes (Signed)
Occupational Therapy Session Note  Patient Details  Name: Valerie Pham MRN: [id number] Date of Birth: [birth year]  Today's Date: 01/28/2015 OT Individual Time: [id number] and 1300-1400 OT Individual Time Calculation (min): 75 min and 60 min   Short Term Goals: Week 2:  OT Short Term Goal 1 (Week 2): Pt will complete 2 grooming tasks in standing with min assist OT Short Term Goal 2 (Week 2): STG = LTGs due to remaining LOS  Skilled Therapeutic Interventions/Progress Updates:    1) Treatment session with focus on standing tolerance, functional use of LUE, and participation in self-care tasks.  Pt completed 2 of 3 grooming tasks in standing with supervision.  Doffed socks so therapist could don TEDS and then pt donning socks and shoes with setup assist.  Engaged in table top task in sitting and standing with focus on standing balance, endurance, and functional use of LUE during card activity.   Pt demonstrated difficulty managing cards in Lt hand, however demonstrating increased use when drawing and discarding cards.  Engaged in [employer] in standing with 3 second limit set and pt able to press 18/25, 27/31, and 34/34 with average reaction time of 2.04, 1.74, and 1.72.  Pt demonstrating improved standing balance and balance reactions at supervision level.  2) Treatment session with focus on hands on education with pt's daughters, Valerie Pham and Valerie Pham, and son, Valerie Pham.  Pt's daughters requesting to be able to transfer pt to/from toilet when they are here.  Performed toilet transfer training with both daughters with therapist initially providing mod cues for sequencing and cueing strategies to decrease pt's reliance on their cues faded to no additional cues from therapist.  Both daughters able to provide appropriate cuing for RW placement and hand placement with sit > stand and transfers, therefore checked off to assist with transfers to/from toilet.  Discussed use of BSC next to bed at home to decrease  burden of care and increase safety with eliminating need to ambulate to toilet over night.  Educated pt's daughters and son on tub/shower transfer with use of tub transfer bench with pt able to lift LLE over tub ledge this session.  With repetition, pt demonstrating increased recall of hand placement with sit > stand, however continues to require cues approx 25% of time.  Continue to recommend 24/7 supervision due to cognitive deficits.  Focus of session on recommendation for 24/7 supervision at home upon d/c and modifications to home setup and routine to achieve d/c goal to return to home.  Therapy Documentation Precautions:  Precautions Precautions: Fall Restrictions Weight Bearing Restrictions: No General:   Vital Signs: Therapy Vitals Temp: 98.2 F (36.8 C) Temp Source: Oral Pulse Rate: 65 Resp: 18 BP: (!) 141/72 mmHg Patient Position (if appropriate): Sitting Oxygen Therapy SpO2: 96 % O2 Device: Not Delivered Pain:  Pt with no c/o pain  See Function Navigator for Current Functional Status.   Therapy/Group: Individual Therapy  Rosalio LoudHOXIE, Raeleigh Guinn 01/28/2015, 8:56 AM

## 2015-01-28 NOTE — Progress Notes (Signed)
Physical Therapy Session Note  Patient Details  Name: Valerie Pham MRN: [id number] Date of Birth: [birth year]  Today's Date: 01/28/2015 PT Individual Time: 1400-1500 PT Individual Time Calculation (min): 60 min   Short Term Goals: Week 2:  PT Short Term Goal 1 (Week 2): Patient will perform bed mobility with supervision and min verbal cues for sequence and technique  PT Short Term Goal 2 (Week 2): Perform sit to and from stand transfer with close supervision  PT Short Term Goal 3 (Week 2): Patient will ambulate 100 feet with close supervision with use of rolling walker for household mobility.  PT Short Term Goal 4 (Week 2): Patient will negotiate 12 steps with bilateral handrails and min assist in order to enter and exit her home environment.   Skilled Therapeutic Interventions/Progress Updates:    Pt received seated in w/c with handoff from OT; family present for hands on education. Three children present, however daughter Jeanice LimHolly the only one who performed hands on during this session despite encouragement for others to participate. Gait in apartment with RW x10' to bed with [facility] providing CGA and verbal cues for safety and sequencing. Sit <>supine close S and cues for technique. Discussed home setup with family and gave handout for ramp. Family to bring in pictures of entryway to problem solve entry; concerned that storm door will get in the way and handrails may be too far apart to safely use at the same time. Stair training x2 trials of 4 6-inch stairs each with B handrails with min guard, repetitive verbal cues from therapist and family regarding sequencing, safety. Floor transfer with minA, max verbal cues for sequencing. Educated pt and family in safety following a fall, problem solving strategies, and importance of maximizing pt independence during transfer for safety of pt and family member. Gait with RW on ramp and curb step with therapist providing min guard and verbal cues on first  trial, daughter providing assist on second trial. Pt returned to room; ambulated x10' with RW to return to sitting in recliner min guard. Remained seated in recliner with family present at completion of session.   Therapy Documentation Precautions:  Precautions Precautions: Fall Restrictions Weight Bearing Restrictions: No Pain: Pain Assessment Pain Assessment: No/denies pain Pain Score: 0-No pain   See Function Navigator for Current Functional Status.   Therapy/Group: Individual Therapy  Vista Lawmanlizabeth J Tygielski 01/28/2015, 3:54 PM

## 2015-01-29 ENCOUNTER — Inpatient Hospital Stay (HOSPITAL_COMMUNITY): Payer: Medicare Other | Admitting: Occupational Therapy

## 2015-01-29 ENCOUNTER — Inpatient Hospital Stay (HOSPITAL_COMMUNITY): Payer: Medicare Other | Admitting: Physical Therapy

## 2015-01-29 LAB — GLUCOSE, CAPILLARY
GLUCOSE-CAPILLARY: 127 mg/dL — AB (ref 65–99)
GLUCOSE-CAPILLARY: 113 mg/dL — AB (ref 65–99)
GLUCOSE-CAPILLARY: 125 mg/dL — AB (ref 65–99)
Glucose-Capillary: 82 mg/dL (ref 65–99)

## 2015-01-29 MED ORDER — METOPROLOL TARTRATE 50 MG PO TABS
100.0000 mg | ORAL_TABLET | Freq: Two times a day (BID) | ORAL | Status: DC
  Administered 2015-01-29 – 2015-02-03 (×11): 100 mg via ORAL
  Filled 2015-01-29 (×11): qty 2

## 2015-01-29 NOTE — Progress Notes (Signed)
[place] PHYSICAL MEDICINE & REHABILITATION     PROGRESS NOTE    Subjective/Complaints:  Patient seen this morning sitting up in her wheelchair. She notes she is pleased with the increased range of motion in her left finger.  ROS: Denies CP, SOB, N/V/D  Objective: Vital Signs: Blood pressure 179/69, pulse 84, temperature 98.3 F (36.8 C), temperature source Oral, resp. rate 18, weight 98.3 kg (216 lb 11.4 oz), SpO2 94 %. No results found. No results for input(s): WBC, HGB, HCT, PLT in the last 72 hours. No results for input(s): NA, K, CL, GLUCOSE, BUN, CREATININE, CALCIUM in the last 72 hours.  Invalid input(s): CO CBG (last 3)   Recent Labs  01/28/15 1628 01/28/15 2047 01/29/15 0624  GLUCAP 101* 128* 127*    Wt Readings from Last 3 Encounters:  01/26/15 98.3 kg (216 lb 11.4 oz)  01/14/15 99 kg (218 lb 4.1 oz)    Physical Exam:  BP 179/69 mmHg  Pulse 84  Temp(Src) 98.3 F (36.8 C) (Oral)  Resp 18  Wt 98.3 kg (216 lb 11.4 oz)  SpO2 94% Constitutional: She appears well-developed and well-nourished. Vital signs reviewed HENT: Normocephalic and atraumatic. Conjunctivae and EOM are normal.  Neck: Normal range of motion. No thyromegaly present.  Cardiovascular: Normal rate and regular rhythm.  Systolic Murmur heard. Respiratory: Effort normal and breath sounds normal. No respiratory distress.  GI: Soft. Bowel sounds are normal. She exhibits no distension.  Musculoskeletal: + Edema but no TTP over second and fifth  digits of left upper extremity (improving)-centered at PIP. Increased ROM  Neurological: She is alert and oriented.  Follows commands.  Fair awareness of deficits Left hemifacial weakness, speech dysarthric Motor: LUE: Shoulder abduction 3/5, elbow flexion and extension 4/5, finger grip 4/5  decreased movement in first finger due to swelling LLE: Antigravity strength  RUE/RLE: Antigravity strength.  Skin: Skin is warm and dry.  Psychiatric: She  has a normal mood and affect. Her behavior is normal. Reasonable insight and awareness  Assessment/Plan: 1. Functional deficits secondary to right pontine infarct , embolic, causing left hemiparesis  which require 3+ hours per day of interdisciplinary therapy in a comprehensive inpatient rehab setting. Physiatrist is providing close team supervision and 24 hour management of active medical problems listed below. Physiatrist and rehab team continue to assess barriers to discharge/monitor patient progress toward functional and medical goals.  Function:  Bathing Bathing position Bathing activity did not occur: Refused Position: Production manager parts bathed by patient: Right arm, Left arm, Chest, Abdomen, Front perineal area, Buttocks, Right upper leg, Left upper leg, Right lower leg, Left lower leg Body parts bathed by helper: Back  Bathing assist Assist Level: Supervision or verbal cues      Upper Body Dressing/Undressing Upper body dressing   What is the patient wearing?: Bra, Pull over shirt/dress Bra - Perfomed by patient: Thread/unthread right bra strap, Thread/unthread left bra strap Bra - Perfomed by helper: Hook/unhook bra (pull down sports bra) Pull over shirt/dress - Perfomed by patient: Pull shirt over trunk, Put head through opening, Thread/unthread left sleeve, Thread/unthread right sleeve Pull over shirt/dress - Perfomed by helper: Pull shirt over trunk        Upper body assist Assist Level: Touching or steadying assistance(Pt > 75%)      Lower Body Dressing/Undressing Lower body dressing   What is the patient wearing?: Socks, Shoes, [employer]- Performed by patient: Thread/unthread right pants leg, Thread/unthread left  pants leg Pants- Performed by helper: Pull pants up/down     Socks - Performed by patient: Don/doff right sock, Don/doff left sock Socks - Performed by helper: Don/doff right sock, Don/doff left sock Shoes - Performed by patient:  Don/doff right shoe, Don/doff left shoe, Fasten right, Fasten left Shoes - Performed by helper: Fasten left       TED Hose - Performed by helper: Don/doff right TED hose, Don/doff left TED hose  Lower body assist Assist for lower body dressing: Set up      Toileting Toileting Toileting activity did not occur: No continent bowel/bladder event Toileting steps completed by patient: Adjust clothing prior to toileting Toileting steps completed by helper: Performs perineal hygiene, Adjust clothing after toileting Toileting Assistive Devices: Grab bar or rail  Toileting assist Assist level:  (max A)   Transfers Chair/bed transfer   Chair/bed transfer method: Stand pivot Chair/bed transfer assist level: Touching or steadying assistance (Pt > 75%) Chair/bed transfer assistive device: Armrests Mechanical lift: Stedy   Locomotion Ambulation     Max distance: 100 Assist level: Touching or steadying assistance (Pt > 75%)   Wheelchair   Type: Manual Max wheelchair distance: 130' Assist Level: Moderate assistance (Pt 50 - 74%)  Cognition Comprehension Comprehension assist level: Follows basic conversation/direction with no assist  Expression Expression assist level: Expresses basic needs/ideas: With no assist  Social Interaction Social Interaction assist level: Interacts appropriately 90% of the time - Needs monitoring or encouragement for participation or interaction.  Problem Solving Problem solving assist level: Solves basic 75 - 89% of the time/requires cueing 10 - 24% of the time  Memory Memory assist level: Recognizes or recalls 90% of the time/requires cueing < 10% of the time    Medical Problem List and Plan: 1.Left side weakness with gait instability secondary to right pontine infarct felt to be embolic Team conference today please see physician documentation under team conference tab, met with team face-to-face to discuss problems,progress, and goals. Formulized individual  treatment plan based on medical history, underlying problem and comorbidities. 2.DVT Prophylaxis/Anticoagulation: Eliquis 3. Pain Management: Tylenol as needed 4. Hypertension. Monitor with increased mobility  Amlodipine 75m started 12/21-  increased to 156mon 12/28  Losartan increased to 50 on 12/24, increased to 100 12/26 (home dose 100)  Metoprolol 50 increased to 100 BID (home dose 100) on 12/31  179/69 last night 5. Neuropsych: This patient is capable of making decisions on her own behalf. 6. Skin/Wound Care: Routine skin checks 7. Fluids/Electrolytes/Nutrition: Routine I&O  8. Diabetes mellitus with peripheral neuropathy. Hemoglobin A1c 7.6. Check blood sugars before meals and at bedtime.   Patient on Glucotrol 10 mg daily and glucophage 500 mg twice daily at home.  Restarted her medications on 12/21, will cont to monitor , fairly well controlled at present  CBG (last 3)   Recent Labs  01/28/15 1628 01/28/15 2047 01/29/15 0624  GLUCAP 101* 128* 127*     Diabetic teaching 9. Hyperlipidemia. Lipitor 10. Hypothyroidism. Synthroid 11. UTI. Urine culture multi-species. Cipro discontinued.No symptoms of recurrence 12. Gout: Pt with hx and swelling of 2nd digit LUE.improved- D/C colchicine         LOS (Days) 11 A FACE TO FACE EVALUATION WAS PERFORMED  Trevaris Pennella AnLorie Phenix2/31/2016 7:21 AM

## 2015-01-29 NOTE — Progress Notes (Signed)
Occupational Therapy Session Note  Patient Details  Name: Valerie Pham MRN: [id number] Date of Birth: 1927/09/26  Today's Date: 01/29/2015 OT Individual Time: 0900-1000 OT Individual Time Calculation (min): 60 min    Short Term Goals: Week 1:  OT Short Term Goal 1 (Week 1): Pt will complete LB dressing with max assist OT Short Term Goal 1 - Progress (Week 1): Met OT Short Term Goal 2 (Week 1): Pt will complete bathing with mod assist OT Short Term Goal 2 - Progress (Week 1): Met OT Short Term Goal 3 (Week 1): Pt will complete toilet transfers with min assist OT Short Term Goal 3 - Progress (Week 1): Met OT Short Term Goal 4 (Week 1): Pt will complete 2 grooming tasks in standing with min assist OT Short Term Goal 4 - Progress (Week 1): Progressing toward goal Week 2:  OT Short Term Goal 1 (Week 2): Pt will complete 2 grooming tasks in standing with min assist OT Short Term Goal 2 (Week 2): STG = LTGs due to remaining LOS      Skilled Therapeutic Interventions/Progress Updates:    Pt seen for skilled OT to facilitate functional mobility, balance, use of LUE during self care skills.  Pt received in bed and agreeable to bathing but not a shower.  Pt initially tried to pull herself up and needed cues to roll onto her side and push up. Once up, frequent cues to push up from bed vs reaching and pulling on the walker. Transferred to w/c with RW with steadying A.  Pt was unable to state if she wore briefs or underwear. She could not recall and was not able to state if she was continent. When pt stood her brief was removed and it was wet. After bathing, new brief applied.  Completed self care at sink with S with sit to stands and A to fully pull pants over hips.  Good use of LUE throughout all bimanual tasks. She has approximately 150 degrees of shoulder flexion with decreased motor control. Pt engaged in bimanual sh flex exercises using a light wt dowel and with self ROM using R hand to fully reach  L arm overhead. Isolated L shoulder flexion to 90 degrees with shoulder circles. Pt in w/c with quick release belt on with all needs met.  Therapy Documentation Precautions:  Precautions Precautions: Fall Restrictions Weight Bearing Restrictions: No  Pain: Pain Assessment Pain Assessment: No/denies pain Pain Score: 0-No pain ADL:  See Function Navigator for Current Functional Status.   Therapy/Group: Individual Therapy  [place] 01/29/2015, 12:19 PM

## 2015-01-29 NOTE — Progress Notes (Signed)
Physical Therapy Session Note  Patient Details  Name: Valerie Pham MRN: [id number] Date of Birth: 1928/01/17  Today's Date: 01/29/2015 PT Individual Time: 1030-1100 PT Individual Time Calculation (min): 30 min   Short Term Goals: Week 1:  PT Short Term Goal 1 (Week 1): Pt will perfor bed mobility S and min verbal cues PT Short Term Goal 1 - Progress (Week 1): Not met PT Short Term Goal 2 (Week 1): Pt will perform sit <>stand with consistent minA PT Short Term Goal 2 - Progress (Week 1): Met PT Short Term Goal 3 (Week 1): Pt will perform stand pivot transfer close S with RW PT Short Term Goal 3 - Progress (Week 1): Not met PT Short Term Goal 4 (Week 1): Pt will perform gait x50' with LRAD and modA PT Short Term Goal 4 - Progress (Week 1): Met PT Short Term Goal 5 (Week 1): Pt will perform ascent/descent of 8 3-inch stairs with modA PT Short Term Goal 5 - Progress (Week 1): Met Week 2:  PT Short Term Goal 1 (Week 2): Patient will perform bed mobility with supervision and min verbal cues for sequence and technique  PT Short Term Goal 2 (Week 2): Perform sit to and from stand transfer with close supervision  PT Short Term Goal 3 (Week 2): Patient will ambulate 100 feet with close supervision with use of rolling walker for household mobility.  PT Short Term Goal 4 (Week 2): Patient will negotiate 12 steps with bilateral handrails and min assist in order to enter and exit her home environment.   Skilled Therapeutic Interventions/Progress Updates:   Pt with decreased L lateral L/S flexion limiting LE clearance on RLE. Pt able to inconsistently carry over cues in gait. Pt would benefit from further mass practice in functional contexts. Pt would continue to benefit from skilled PT services to increase functional mobility.  Therapy Documentation Precautions:  Precautions Precautions: Fall Restrictions Weight Bearing Restrictions: No Vital Signs: Therapy Vitals Pulse Rate: 62 BP: (!)  155/68 mmHg Pain: Pain Assessment Pain Assessment: No/denies pain Pain Score: 0-No pain Mobility:  Min A with cues for hand placement, weight shift, and ECC control Locomotion :   100' MinA with RW with cues for posture, core activation, and LE clearance Other Treatments:  Pt educated on rehab plan, safety in mobility, core control, and posture. Seated RUE reaching with facilitation of L lateral L/S flexion 3x10. Pt performs static standing 1'x4 in session. Pt performs transfers x12 in session. Pt performs pre-gait BLE advancement 2x10.  See Function Navigator for Current Functional Status.   Therapy/Group: Individual Therapy  Monia Pouch 01/29/2015, 10:46 AM

## 2015-01-30 ENCOUNTER — Inpatient Hospital Stay (HOSPITAL_COMMUNITY): Payer: Medicare Other | Admitting: Occupational Therapy

## 2015-01-30 LAB — GLUCOSE, CAPILLARY
GLUCOSE-CAPILLARY: 105 mg/dL — AB (ref 65–99)
GLUCOSE-CAPILLARY: 160 mg/dL — AB (ref 65–99)
GLUCOSE-CAPILLARY: 149 mg/dL — AB (ref 65–99)
Glucose-Capillary: 106 mg/dL — ABNORMAL HIGH (ref 65–99)

## 2015-01-30 NOTE — Progress Notes (Signed)
[place] PHYSICAL MEDICINE & REHABILITATION     PROGRESS NOTE    Subjective/Complaints:  Patient sitting up in bed this morning eating breakfast. She notes improvement with range of motion in the fingers and her left hand.  ROS: Denies CP, SOB, N/V/D  Objective: Vital Signs: Blood pressure 155/84, pulse 65, temperature 98.4 F (36.9 C), temperature source Oral, resp. rate 18, weight 98.3 kg (216 lb 11.4 oz), SpO2 97 %. No results found. No results for input(s): WBC, HGB, HCT, PLT in the last 72 hours. No results for input(s): NA, K, CL, GLUCOSE, BUN, CREATININE, CALCIUM in the last 72 hours.  Invalid input(s): CO CBG (last 3)   Recent Labs  01/29/15 1651 01/29/15 2101 01/30/15 0657  GLUCAP 125* 82 105*    Wt Readings from Last 3 Encounters:  01/26/15 98.3 kg (216 lb 11.4 oz)  01/14/15 99 kg (218 lb 4.1 oz)    Physical Exam:  BP 155/84 mmHg  Pulse 65  Temp(Src) 98.4 F (36.9 C) (Oral)  Resp 18  Wt 98.3 kg (216 lb 11.4 oz)  SpO2 97% Constitutional: She appears well-developed and well-nourished. Vital signs reviewed HENT: Normocephalic and atraumatic. Conjunctivae and EOM are normal.  Neck: Normal range of motion. No thyromegaly present.  Cardiovascular: Normal rate and regular rhythm.  Systolic Murmur heard. Respiratory: Effort normal and breath sounds normal. No respiratory distress.  GI: Soft. Bowel sounds are normal. She exhibits no distension.  Musculoskeletal: + Edema but no TTP over second and fifth  digits of left upper extremity (improving)-centered at PIP. Increased ROM  Neurological: She is alert and oriented.  Follows commands.  Fair awareness of deficits Left hemifacial weakness, speech dysarthric Motor: LUE: Shoulder abduction 3/5, elbow flexion and extension 4/5, finger grip 4/5  decreased movement in first finger due to swelling LLE: Antigravity strength  RUE/RLE: Antigravity strength.  Skin: Skin is warm and dry.  Psychiatric: She has a  normal mood and affect. Her behavior is normal. Reasonable insight and awareness  Assessment/Plan: 1. Functional deficits secondary to right pontine infarct , embolic, causing left hemiparesis  which require 3+ hours per day of interdisciplinary therapy in a comprehensive inpatient rehab setting. Physiatrist is providing close team supervision and 24 hour management of active medical problems listed below. Physiatrist and rehab team continue to assess barriers to discharge/monitor patient progress toward functional and medical goals.  Function:  Bathing Bathing position Bathing activity did not occur: Refused Position: Wheelchair/chair at sink  Bathing parts Body parts bathed by patient: Right arm, Left arm, Chest, Abdomen, Front perineal area, Buttocks, Right upper leg, Left upper leg, Right lower leg, Left lower leg Body parts bathed by helper: Back  Bathing assist Assist Level: Supervision or verbal cues      Upper Body Dressing/Undressing Upper body dressing   What is the patient wearing?: Pull over shirt/dress Bra - Perfomed by patient: Thread/unthread right bra strap, Thread/unthread left bra strap Bra - Perfomed by helper: Hook/unhook bra (pull down sports bra) Pull over shirt/dress - Perfomed by patient: Pull shirt over trunk, Put head through opening, Thread/unthread left sleeve, Thread/unthread right sleeve Pull over shirt/dress - Perfomed by helper: Pull shirt over trunk        Upper body assist Assist Level: Set up   Set up : To obtain clothing/put away  Lower Body Dressing/Undressing Lower body dressing   What is the patient wearing?: Pants, Ted Hose, Shoes     Pants- Performed by patient: Thread/unthread right pants leg, Thread/unthread  left pants leg Pants- Performed by helper: Pull pants up/down     Socks - Performed by patient: Don/doff right sock, Don/doff left sock Socks - Performed by helper: Don/doff right sock, Don/doff left sock Shoes - Performed by  patient: Don/doff right shoe, Don/doff left shoe, Fasten right, Fasten left Shoes - Performed by helper: Fasten left       TED Hose - Performed by helper: Don/doff right TED hose, Don/doff left TED hose  Lower body assist Assist for lower body dressing: Set up      Toileting Toileting Toileting activity did not occur: No continent bowel/bladder event Toileting steps completed by patient: Adjust clothing prior to toileting Toileting steps completed by helper: Performs perineal hygiene, Adjust clothing after toileting Toileting Assistive Devices: Grab bar or rail  Toileting assist Assist level:  (max A)   Transfers Chair/bed transfer   Chair/bed transfer method: Stand pivot Chair/bed transfer assist level: Touching or steadying assistance (Pt > 75%) Chair/bed transfer assistive device: Environmental manager lift: Ecologist     Max distance: 100 Assist level: Touching or steadying assistance (Pt > 75%)   Wheelchair   Type: Manual Max wheelchair distance: 130' Assist Level: Moderate assistance (Pt 50 - 74%)  Cognition Comprehension Comprehension assist level: Follows basic conversation/direction with no assist  Expression Expression assist level: Expresses basic needs/ideas: With no assist  Social Interaction Social Interaction assist level: Interacts appropriately 90% of the time - Needs monitoring or encouragement for participation or interaction.  Problem Solving Problem solving assist level: Solves basic 75 - 89% of the time/requires cueing 10 - 24% of the time  Memory Memory assist level: Recognizes or recalls 90% of the time/requires cueing < 10% of the time    Medical Problem List and Plan: 1.Left side weakness with gait instability secondary to right pontine infarct felt to be embolic Team conference today please see physician documentation under team conference tab, met with team face-to-face to discuss problems,progress, and goals. Formulized  individual treatment plan based on medical history, underlying problem and comorbidities. 2.DVT Prophylaxis/Anticoagulation: Eliquis 3. Pain Management: Tylenol as needed 4. Hypertension. Monitor with increased mobility  Amlodipine 50m started 12/21-  increased to 174mon 12/28  Losartan increased to 50 on 12/24, increased to 100 12/26 (home dose 100)  Metoprolol 50 increased to 100 BID (home dose 100) on 12/31  BP remains slightly elevated this morning, but improved.  Will continue to follow and consider further increase tomorrow if necessary 5. Neuropsych: This patient is capable of making decisions on her own behalf. 6. Skin/Wound Care: Routine skin checks 7. Fluids/Electrolytes/Nutrition: Routine I&O  8. Diabetes mellitus with peripheral neuropathy. Hemoglobin A1c 7.6. Check blood sugars before meals and at bedtime.   Patient on Glucotrol 10 mg daily and glucophage 500 mg twice daily at home.  Restarted her medications on 12/21, will cont to monitor , fairly well controlled at present  CBG (last 3)   Recent Labs  01/29/15 1651 01/29/15 2101 01/30/15 0657  GLUCAP 125* 82 105*    Diabetic teaching 9. Hyperlipidemia. Lipitor 10. Hypothyroidism. Synthroid 11. UTI. Urine culture multi-species. Cipro discontinued.No symptoms of recurrence 12. Gout: Pt with hx and swelling of 2nd digit LUE.improved- D/Ced colchicine     LOS (Days) 12 A FACE TO FACE EVALUATION WAS PERFORMED  Ankit AnLorie Phenix/01/2015 8:41 AM

## 2015-01-30 NOTE — Progress Notes (Signed)
Occupational Therapy Session Note  Patient Details  Name: Valerie Pham MRN: [id number] Date of Birth: 07/17/1927  Today's Date: Pham OT Individual Time: [id number] OT Individual Time Calculation (min): 60 min    Short Term Goals: Week 2:  OT Short Term Goal 1 (Week 2): Pt will complete 2 grooming tasks in standing with min assist OT Short Term Goal 2 (Week 2): STG = LTGs due to remaining LOS  Skilled Therapeutic Interventions/Progress Updates: Patient stated she did not sleep well last night but elected to bathe in w/c at sink for skilled OT before lying down at end of session to rest.    Focus was on transfers (Min A to close S) and dynamic balance activities.    As well, patient was able to don ted hose with extra time and rest breaks as she became very winded bending forward to don ted hose and tredded socks (She preferred being forward to crossing leg over knee method).  She elected not to change clothing after her bathing session & stated, "I just put these clothes on last night and they are fine to wear again today."  At end of session, patient was assisted back to bell with bell alarm, call bell and phone in place.     Therapy Documentation Precautions:  Precautions Precautions: Fall Restrictions Weight Bearing Restrictions: No   Pain: Pain Assessment Pain Assessment: No/denies pain  See Function Navigator for Current Functional Status.   Therapy/Group: Individual Therapy  Bud Faceickett, Kilo Eshelman [facility] Surgery [facility] IncYeary Pham, Valerie Pham

## 2015-01-30 NOTE — Progress Notes (Signed)
01/30/15 1420 nursing Per report pt was able to take her seatbelt off on her wheelchair  last night and was found in the bathroom. Chair alarm placed daughter in room notified of the events.

## 2015-01-31 ENCOUNTER — Inpatient Hospital Stay (HOSPITAL_COMMUNITY): Payer: Medicare Other

## 2015-01-31 ENCOUNTER — Inpatient Hospital Stay (HOSPITAL_COMMUNITY): Payer: Medicare Other | Admitting: Occupational Therapy

## 2015-01-31 ENCOUNTER — Inpatient Hospital Stay (HOSPITAL_COMMUNITY): Payer: Medicare Other | Admitting: Physical Therapy

## 2015-01-31 ENCOUNTER — Inpatient Hospital Stay (HOSPITAL_COMMUNITY): Payer: Medicare Other | Admitting: Speech Pathology

## 2015-01-31 LAB — GLUCOSE, CAPILLARY
GLUCOSE-CAPILLARY: 118 mg/dL — AB (ref 65–99)
Glucose-Capillary: 125 mg/dL — ABNORMAL HIGH (ref 65–99)
Glucose-Capillary: 157 mg/dL — ABNORMAL HIGH (ref 65–99)
Glucose-Capillary: 147 mg/dL — ABNORMAL HIGH (ref 65–99)

## 2015-01-31 NOTE — Progress Notes (Signed)
Physical Therapy Session Note  Patient Details  Name: Valerie Pham MRN: [id number] Date of Birth: 1928-01-07  Today's Date: 01/31/2015 PT Individual Time: 1000-1100 PT Individual Time Calculation (min): 60 min   Short Term Goals: Week 2:  PT Short Term Goal 1 (Week 2): Patient will perform bed mobility with supervision and min verbal cues for sequence and technique  PT Short Term Goal 2 (Week 2): Perform sit to and from stand transfer with close supervision  PT Short Term Goal 3 (Week 2): Patient will ambulate 100 feet with close supervision with use of rolling walker for household mobility.  PT Short Term Goal 4 (Week 2): Patient will negotiate 12 steps with bilateral handrails and min assist in order to enter and exit her home environment.   Skilled Therapeutic Interventions/Progress Updates:    Individual session with focus on endurance, strengthening, LLE NMR for carryover into gait, transfers, stairs. W/c propulsion x100' with minA due to difficulty maintaining straight path with weaker LUE. Gait with RW and min guard x100' including turns. Facilitation at L glutes for stance control with severe trendelenburg, and pt unable to maintain without repetitive cueing. Sit <>stand and sitting/standing balance with dynamic reaching while matching cards to facilitate visual scanning, sustained attention. Requires min cues for scanning entire area to locate targets, min guard for standing balance. Stair training 2x8 stairs 3-inch height with alternating pattern for LLE strengthening and NMR. Pt educated in difference between step-to pattern for safety at home with family vs alternating pattern with therapy for strengthening purposes. Nustep x10 min on level 2 with BUE/BLE for strengthening, aerobic endurance. Returned to room totalA and remained seated in w/c at completion of session all needs within reach, chair alarm and quick release belt intact. Pt educated on importance of waiting for nursing or  therapy staff to be present before getting up and pt agreeable.   Therapy Documentation Precautions:  Precautions Precautions: Fall Restrictions Weight Bearing Restrictions: No Pain: Pain Assessment Pain Assessment: No/denies pain Pain Score: 0-No pain   See Function Navigator for Current Functional Status.   Therapy/Group: Individual Therapy  Vista Lawmanlizabeth J Tygielski 01/31/2015, 10:58 AM

## 2015-01-31 NOTE — Progress Notes (Signed)
Occupational Therapy Session Note  Patient Details  Name: Valerie Pham MRN: [id number] Date of Birth: 1927/09/13  Today's Date: 01/31/2015 OT Individual Time: [id number] OT Individual Time Calculation (min): 75 min    Short Term Goals: Week 2:  OT Short Term Goal 1 (Week 2): Pt will complete 2 grooming tasks in standing with min assist OT Short Term Goal 2 (Week 2): STG = LTGs due to remaining LOS  Skilled Therapeutic Interventions/Progress Updates:    Treatment session with focus on functional mobility, standing balance, and functional use of LUE.  Pt declined bathing and dressing this session, but reported needing to toilet.  Ambulated to toilet with RW and min/steady assist.  Mod cues provided for hand placement with sit > stand.  Completed perineal hygiene and changed brief with setup from therapist.  Pt doffed and donned clean pants with setup assist.  Pt also donned TEDS and shoes this session with increased time due to decreased breath support when bending forward.  Engaged in dynamic standing task with focus on increasing standing tolerance and use of LUE.  Completed 3D pipe tree activity in standing with focus on following directions, problem solving, and functional use of LUE.  Pt required min verbal cues for sequencing of task and choosing appropriate pieces.  Pt required seated rest break in middle of standing task, then returned to standing with min cues.  Pt ambulated back to room approx 80 feet with use of RW and contact guard.  Therapy Documentation Precautions:  Precautions Precautions: Fall Restrictions Weight Bearing Restrictions: No General:   Vital Signs: Therapy Vitals BP: 140/80 mmHg Pain:  Pt with no c/o pain  See Function Navigator for Current Functional Status.   Therapy/Group: Individual Therapy  Rosalio LoudHOXIE, Brystal Kildow 01/31/2015, 10:29 AM

## 2015-01-31 NOTE — Progress Notes (Signed)
Speech Language Pathology Daily Session Note  Patient Details  Name: Valerie MangoSara H Mclure MRN: [id number] Date of Birth: [birth year]  Today's Date: 01/31/2015 SLP Individual Time: [id number] SLP Individual Time Calculation (min): 43 min  Short Term Goals: Week 2: SLP Short Term Goal 1 (Week 2): Pt will achieve intelligibility in conversations with mod I over 3 consecutive sessions.    Skilled Therapeutic Interventions: Skilled treatment session focused on addressing speech intelligibility goals. SLP facilitated session by providing open ended conversation topics in a mildly distracting environment; patient required increased time to self-monitor and correct speech intelligibility at the conversational level for overall Mod I.  Continue with current plan of care.    Function:  Eating Eating     Eating Assist Level: Set up assist for           Cognition Comprehension Comprehension assist level: Follows basic conversation/direction with no assist  Expression   Expression assist level: Expresses complex ideas: With extra time/assistive device  Social Interaction Social Interaction assist level: Interacts appropriately 90% of the time - Needs monitoring or encouragement for participation or interaction.  Problem Solving Problem solving assist level: Solves basic 75 - 89% of the time/requires cueing 10 - 24% of the time  Memory Memory assist level: Recognizes or recalls 90% of the time/requires cueing < 10% of the time    Pain Pain Assessment Pain Assessment: No/denies pain  Therapy/Group: Individual Therapy  Charlane FerrettiMelissa Lynise Porr, M.A., CCC-SLP [phone]  Dallen Bunte 01/31/2015, 6:29 PM

## 2015-01-31 NOTE — Plan of Care (Signed)
Problem: RH PAIN MANAGEMENT Goal: RH STG PAIN MANAGED AT OR BELOW PT'S PAIN GOAL <4  Outcome: Progressing No c/o pain     

## 2015-01-31 NOTE — Progress Notes (Signed)
Physical Therapy Session Note  Patient Details  Name: Valerie Pham MRN: [id number] Date of Birth: [birth year]  Today's Date: 01/31/2015 PT Individual Time: 1400-1430 PT Individual Time Calculation (min): 30 min   Short Term Goals: Week 2:  PT Short Term Goal 1 (Week 2): Patient will perform bed mobility with supervision and min verbal cues for sequence and technique  PT Short Term Goal 2 (Week 2): Perform sit to and from stand transfer with close supervision  PT Short Term Goal 3 (Week 2): Patient will ambulate 100 feet with close supervision with use of rolling walker for household mobility.  PT Short Term Goal 4 (Week 2): Patient will negotiate 12 steps with bilateral handrails and min assist in order to enter and exit her home environment.   Skilled Therapeutic Interventions/Progress Updates:   Session focused on functional transfers with RW with focus on correct hand placement and technique, functional w/c mobility with focus on functional use of LUE to propel w/c, and neuro re-ed to address balance, postural control, and forced use/weightbearing on LLE during functional reaching tasks, sit <> stands, and standing hip abduction and mini squats x 10 reps each x 2 sets. Upon return to room, pt's daughter in room and provided therapist with photos for primary PT and OT as well as schedule for upcoming family education. Notified scheduling team. Safety belt and char alarm on and call bell in reach.   Therapy Documentation Precautions:  Precautions Precautions: Fall Restrictions Weight Bearing Restrictions: No  Pain:  Denies pain.   See Function Navigator for Current Functional Status.   Therapy/Group: Individual Therapy  Karolee StampsGray, Valerie Pham Darrol PokeBrescia  [place]ceana Walthall B. Egan Berkheimer, PT, DPT  01/31/2015, 3:36 PM

## 2015-01-31 NOTE — Progress Notes (Signed)
[name] PHYSICAL MEDICINE & REHABILITATION     PROGRESS NOTE    Subjective/Complaints:  Pt feels she can move LLE better  ROS: Denies CP, SOB, N/V/D  Objective: Vital Signs: Blood pressure 161/83, pulse 62, temperature 98.3 F (36.8 C), temperature source Oral, resp. rate 18, weight 98.3 kg (216 lb 11.4 oz), SpO2 100 %. No results found. No results for input(s): WBC, HGB, HCT, PLT in the last 72 hours. No results for input(s): NA, K, CL, GLUCOSE, BUN, CREATININE, CALCIUM in the last 72 hours.  Invalid input(s): CO CBG (last 3)   Recent Labs  01/30/15 1638 01/30/15 2117 01/31/15 0701  GLUCAP 160* 149* 125*    Wt Readings from Last 3 Encounters:  01/26/15 98.3 kg (216 lb 11.4 oz)  01/14/15 99 kg (218 lb 4.1 oz)    Physical Exam:  BP 161/83 mmHg  Pulse 62  Temp(Src) 98.3 F (36.8 C) (Oral)  Resp 18  Wt 98.3 kg (216 lb 11.4 oz)  SpO2 100% Constitutional: She appears well-developed and well-nourished. Vital signs reviewed HENT: Normocephalic and atraumatic. Conjunctivae and EOM are normal.  Neck: Normal range of motion. No thyromegaly present.  Cardiovascular: Normal rate and regular rhythm.  Systolic Murmur heard. Respiratory: Effort normal and breath sounds normal. No respiratory distress.  GI: Soft. Bowel sounds are normal. She exhibits no distension.  Musculoskeletal: + Edema but no TTP over second and fifth  digits of left upper extremity (improving)-centered at PIP. Increased ROM  Neurological: She is alert and oriented.  Follows commands.  Fair awareness of deficits Left hemifacial weakness, speech dysarthric Motor: LUE: Shoulder abduction 3/5, elbow flexion and extension 4/5, finger grip 4/5  decreased movement in first finger due to swelling LLE: 4/5  RUE/RLE: 5/5  Skin: Skin is warm and dry.  Psychiatric: She has a normal mood and affect. Her behavior is normal. Reasonable insight and awareness  Assessment/Plan: 1. Functional deficits  secondary to right pontine infarct , embolic, causing left hemiparesis  which require 3+ hours per day of interdisciplinary therapy in a comprehensive inpatient rehab setting. Physiatrist is providing close team supervision and 24 hour management of active medical problems listed below. Physiatrist and rehab team continue to assess barriers to discharge/monitor patient progress toward functional and medical goals.  Function:  Bathing Bathing position Bathing activity did not occur: Refused Position: Wheelchair/chair at sink  Bathing parts Body parts bathed by patient: Left arm, Right arm, Chest, Abdomen, Front perineal area, Right upper leg, Left upper leg, Right lower leg, Left lower leg Body parts bathed by helper: Buttocks, Back  Bathing assist Assist Level: Touching or steadying assistance(Pt > 75%)      Upper Body Dressing/Undressing Upper body dressing   What is the patient wearing?: Pull over shirt/dress Bra - Perfomed by patient: Thread/unthread right bra strap, Thread/unthread left bra strap Bra - Perfomed by helper: Hook/unhook bra (pull down sports bra) Pull over shirt/dress - Perfomed by patient: Thread/unthread right sleeve, Thread/unthread left sleeve, Put head through opening, Pull shirt over trunk Pull over shirt/dress - Perfomed by helper: Pull shirt over trunk        Upper body assist Assist Level: Supervision or verbal cues   Set up : To obtain clothing/put away  Lower Body Dressing/Undressing Lower body dressing   What is the patient wearing?: Pants, Ted Hose, Socks     Pants- Performed by patient: Thread/unthread right pants leg, Thread/unthread left pants leg, Pull pants up/down Pants- Performed by helper: Pull pants up/down  Socks - Performed by patient: Don/doff right sock, Don/doff left sock Socks - Performed by helper: Don/doff right sock, Don/doff left sock Shoes - Performed by patient: Don/doff right shoe, Don/doff left shoe, Fasten right, Fasten  left Shoes - Performed by helper: Fasten left     TED Hose - Performed by patient: Don/doff right TED hose TED Hose - Performed by helper: Don/doff left TED hose  Lower body assist Assist for lower body dressing: Set up      Toileting Toileting Toileting activity did not occur: Refused Toileting steps completed by patient: Adjust clothing prior to toileting, Performs perineal hygiene Toileting steps completed by helper: Adjust clothing after toileting Toileting Assistive Devices: Grab bar or rail  Toileting assist Assist level: Touching or steadying assistance (Pt.75%)   Transfers Chair/bed transfer   Chair/bed transfer method: Squat pivot Chair/bed transfer assist level: Supervision or verbal cues Chair/bed transfer assistive device: Environmental manager lift: Ecologist     Max distance: 100 Assist level: Touching or steadying assistance (Pt > 75%)   Wheelchair   Type: Manual Max wheelchair distance: 130' Assist Level: Moderate assistance (Pt 50 - 74%)  Cognition Comprehension Comprehension assist level: Follows basic conversation/direction with no assist  Expression Expression assist level: Expresses basic needs/ideas: With no assist  Social Interaction Social Interaction assist level: Interacts appropriately 90% of the time - Needs monitoring or encouragement for participation or interaction.  Problem Solving Problem solving assist level: Solves basic 75 - 89% of the time/requires cueing 10 - 24% of the time  Memory Memory assist level: Recognizes or recalls 90% of the time/requires cueing < 10% of the time    Medical Problem List and Plan: 1.Left side weakness with gait instability secondary to right pontine infarct felt to be embolic Team conference today please see physician documentation under team conference tab, met with team face-to-face to discuss problems,progress, and goals. Formulized individual treatment plan based on medical history,  underlying problem and comorbidities. 2.DVT Prophylaxis/Anticoagulation: Eliquis to cover both CVA and DVT prophyllaxis 3. Pain Management: Tylenol as needed 4. Hypertension. Monitor with increased mobility  Amlodipine 50m started 12/21-  increased to 174mon 12/28  Losartan increased to 50 on 12/24, increased to 100 12/26 (home dose 100)  Metoprolol 50 increased to 100 BID (home dose 100) on 12/31, HR down in 60s as expected  145-160 sys last 24 hr 5. Neuropsych: This patient is capable of making decisions on her own behalf. 6. Skin/Wound Care: Routine skin checks 7. Fluids/Electrolytes/Nutrition: Routine I&O  8. Diabetes mellitus with peripheral neuropathy. Hemoglobin A1c 7.6. Check blood sugars before meals and at bedtime.   Patient on Glucotrol 10 mg daily and glucophage 500 mg twice daily at home.  Overall control is adequate, pt has good intake CBG (last 3)   Recent Labs  01/30/15 1638 01/30/15 2117 01/31/15 0701  GLUCAP 160* 149* 125*     Diabetic teaching 9. Hyperlipidemia. Lipitor 10. Hypothyroidism. Synthroid 11. UTI. Urine culture multi-species. Cipro discontinued.No symptoms of recurrence 12. Gout: resolved, monitor recurrence         LOS (Days) 13 A FACE TO FACE EVALUATION WAS PERFORMED  Valerie Pham E 01/31/2015 8:25 AM

## 2015-02-01 ENCOUNTER — Inpatient Hospital Stay (HOSPITAL_COMMUNITY): Payer: Medicare Other | Admitting: Physical Therapy

## 2015-02-01 ENCOUNTER — Inpatient Hospital Stay (HOSPITAL_COMMUNITY): Payer: Medicare Other | Admitting: Occupational Therapy

## 2015-02-01 ENCOUNTER — Encounter (HOSPITAL_COMMUNITY): Payer: Medicare Other

## 2015-02-01 LAB — GLUCOSE, CAPILLARY
GLUCOSE-CAPILLARY: 92 mg/dL (ref 65–99)
Glucose-Capillary: 125 mg/dL — ABNORMAL HIGH (ref 65–99)
Glucose-Capillary: 158 mg/dL — ABNORMAL HIGH (ref 65–99)
Glucose-Capillary: 100 mg/dL — ABNORMAL HIGH (ref 65–99)

## 2015-02-01 NOTE — Progress Notes (Signed)
[facility] PHYSICAL MEDICINE & REHABILITATION     PROGRESS NOTE    Subjective/Complaints:  No issues overnite THerapy going well R index finger swelling but no pain  ROS: Denies CP, SOB, N/V/D  Objective: Vital Signs: Blood pressure 151/82, pulse 68, temperature 98 F (36.7 C), temperature source Oral, resp. rate 18, weight 98.3 kg (216 lb 11.4 oz), SpO2 98 %. No results found. No results for input(s): WBC, HGB, HCT, PLT in the last 72 hours. No results for input(s): NA, K, CL, GLUCOSE, BUN, CREATININE, CALCIUM in the last 72 hours.  Invalid input(s): CO CBG (last 3)   Recent Labs  01/31/15 1631 01/31/15 2107 02/01/15 0649  GLUCAP 157* 147* 125*    Wt Readings from Last 3 Encounters:  01/26/15 98.3 kg (216 lb 11.4 oz)  01/14/15 99 kg (218 lb 4.1 oz)    Physical Exam:  BP 151/82 mmHg  Pulse 68  Temp(Src) 98 F (36.7 C) (Oral)  Resp 18  Wt 98.3 kg (216 lb 11.4 oz)  SpO2 98% Constitutional: She appears well-developed and well-nourished. Vital signs reviewed HENT: Normocephalic and atraumatic. Conjunctivae and EOM are normal.  Neck: Normal range of motion. No thyromegaly present.  Cardiovascular: Normal rate and regular rhythm.  Systolic Murmur heard. Respiratory: Effort normal and breath sounds normal. No respiratory distress.  GI: Soft. Bowel sounds are normal. She exhibits no distension.  Musculoskeletal: + Edema but no TTP over second and fifth  digits of left upper extremity (improving)-centered at PIP. Increased ROM  Neurological: She is alert and oriented.  Follows commands.  Fair awareness of deficits Left hemifacial weakness, speech dysarthric Motor: LUE: Shoulder abduction 3/5, elbow flexion and extension 4/5, finger grip 4/5  decreased movement in first finger due to swelling LLE: 4/5  RUE/RLE: 5/5  Skin: Skin is warm and dry.  Psychiatric: She has a normal mood and affect. Her behavior is normal. Reasonable insight and  awareness  Assessment/Plan: 1. Functional deficits secondary to right pontine infarct , embolic, causing left hemiparesis  which require 3+ hours per day of interdisciplinary therapy in a comprehensive inpatient rehab setting. Physiatrist is providing close team supervision and 24 hour management of active medical problems listed below. Physiatrist and rehab team continue to assess barriers to discharge/monitor patient progress toward functional and medical goals.  Function:  Bathing Bathing position Bathing activity did not occur: Refused Position: Wheelchair/chair at sink  Bathing parts Body parts bathed by patient: Left arm, Right arm, Chest, Abdomen, Front perineal area, Right upper leg, Left upper leg, Right lower leg, Left lower leg Body parts bathed by helper: Buttocks, Back  Bathing assist Assist Level: Touching or steadying assistance(Pt > 75%)      Upper Body Dressing/Undressing Upper body dressing   What is the patient wearing?: Pull over shirt/dress Bra - Perfomed by patient: Thread/unthread right bra strap, Thread/unthread left bra strap Bra - Perfomed by helper: Hook/unhook bra (pull down sports bra) Pull over shirt/dress - Perfomed by patient: Thread/unthread right sleeve, Thread/unthread left sleeve, Put head through opening, Pull shirt over trunk Pull over shirt/dress - Perfomed by helper: Pull shirt over trunk        Upper body assist Assist Level: Supervision or verbal cues   Set up : To obtain clothing/put away  Lower Body Dressing/Undressing Lower body dressing   What is the patient wearing?: Pants, Ted Hose, Shoes, Non-skid slipper socks     Pants- Performed by patient: Thread/unthread right pants leg, Thread/unthread left pants leg, Pull pants  up/down Pants- Performed by helper: Pull pants up/down Non-skid slipper socks- Performed by patient: Don/doff right sock, Don/doff left sock   Socks - Performed by patient: Don/doff right sock, Don/doff left  sock Socks - Performed by helper: Don/doff right sock, Don/doff left sock Shoes - Performed by patient: Don/doff right shoe, Don/doff left shoe, Fasten right, Fasten left Shoes - Performed by helper: Fasten left     TED Hose - Performed by patient: Don/doff right TED hose, Don/doff left TED hose TED Hose - Performed by helper: Don/doff left TED hose  Lower body assist Assist for lower body dressing: Set up, Supervision or verbal cues   Set up : To obtain clothing/put away  Toileting Toileting Toileting activity did not occur: Refused Toileting steps completed by patient: Adjust clothing prior to toileting, Performs perineal hygiene, Adjust clothing after toileting Toileting steps completed by helper: Adjust clothing after toileting Toileting Assistive Devices: Grab bar or rail  Toileting assist Assist level: Supervision or verbal cues   Transfers Chair/bed transfer   Chair/bed transfer method: Ambulatory Chair/bed transfer assist level: Supervision or verbal cues Chair/bed transfer assistive device: Environmental manager lift: Ecologist     Max distance: 100 Assist level: Supervision or verbal cues   Wheelchair   Type: Manual Max wheelchair distance: 100 Assist Level: Touching or steadying assistance (Pt > 75%)  Cognition Comprehension Comprehension assist level: Follows basic conversation/direction with no assist  Expression Expression assist level: Expresses complex ideas: With extra time/assistive device  Social Interaction Social Interaction assist level: Interacts appropriately 90% of the time - Needs monitoring or encouragement for participation or interaction.  Problem Solving Problem solving assist level: Solves basic 75 - 89% of the time/requires cueing 10 - 24% of the time  Memory Memory assist level: Recognizes or recalls 90% of the time/requires cueing < 10% of the time    Medical Problem List and Plan: 1.Left side weakness with gait  instability secondary to right pontine infarct felt to be embolic Team conference today please see physician documentation under team conference tab, met with team face-to-face to discuss problems,progress, and goals. Formulized individual treatment plan based on medical history, underlying problem and comorbidities. 2.DVT Prophylaxis/Anticoagulation: Eliquis to cover both CVA and DVT prophyllaxis 3. Pain Management: Tylenol as needed 4. Hypertension. Monitor with increased mobility  Amlodipine 51m started 12/21-  increased to 160mon 12/28  Losartan increased to 50 on 12/24, increased to 100 12/26 (home dose 100)  Metoprolol 50 increased to 100 BID (home dose 100) on 12/31, HR down in 60s as expected  151/82 today 5. Neuropsych: This patient is capable of making decisions on her own behalf. 6. Skin/Wound Care: Routine skin checks 7. Fluids/Electrolytes/Nutrition: Routine I&O  8. Diabetes mellitus with peripheral neuropathy. Hemoglobin A1c 7.6. Check blood sugars before meals and at bedtime.   Patient on Glucotrol 10 mg daily and glucophage 500 mg twice daily at home.  Overall control is adequate, pt has good intake CBG (last 3)   Recent Labs  01/31/15 1631 01/31/15 2107 02/01/15 0649  GLUCAP 157* 147* 125*     Diabetic teaching 9. Hyperlipidemia. Lipitor 10. Hypothyroidism. Synthroid           LOS (Days) 14 A FACE TO FACE EVALUATION WAS PERFORMED  Valerie Pham E 02/01/2015 7:05 AM

## 2015-02-01 NOTE — Progress Notes (Signed)
Physical Therapy Session Note  Patient Details  Name: Valerie Pham H Berendt MRN: [id number] Date of Birth: [birth year]  Today's Date: 02/01/2015 PT Individual Time: 1000-1100 and 1400-1500 PT Individual Time Calculation (min): 60 min and 60 min (total 120 min)   Short Term Goals: Week 2:  PT Short Term Goal 1 (Week 2): Patient will perform bed mobility with supervision and min verbal cues for sequence and technique  PT Short Term Goal 2 (Week 2): Perform sit to and from stand transfer with close supervision  PT Short Term Goal 3 (Week 2): Patient will ambulate 100 feet with close supervision with use of rolling walker for household mobility.  PT Short Term Goal 4 (Week 2): Patient will negotiate 12 steps with bilateral handrails and min assist in order to enter and exit her home environment.   Skilled Therapeutic Interventions/Progress Updates:    Tx 1: Pt received seated on edge of mat table with handoff from OT. No c/o pain and agreeable to treatment. Sit <>stand without UE support 2 x 10 with standby A for balance; performed for LLE forced use, strengthening, endurance. Stair training on 6-inch stairs 4 trials of 4 stairs each to simulate home environment, use of B handrails and close S. Repetitive verbal cues for sequencing; pt able to recall RLE lead during ascent one every trial, however unable to recall LLE lead for descent on any trial, requiring cues each time. Standing balance with dynamic UE reaching to knee level and over head to retrieve and hang horseshoes on basketball hoop. Initial trial close S with LEs supported on table; second trial with pt away from table support and required min guard for standing balance. Nustep x10 min on level 5 with BUE/BLE for strengthening and endurance. Stand pivot to return to w/c; returned to room in w/c totalA for energy conservation. Remained seated in w/c with quick release belt, chair alarm in place and all needs within reach at completion of session.  Tx  2: Pt received seated in w/c, no c/o pain and agreeable to treatment. Caregiver Darel HongJudy present for hands on training and education. Pt performed gait 100'x2 trials at beginning and end of session with RW and Darel HongJudy providing close S. Car transfer x2 including stand pivot and ambulating up to car with RW, close S from caregiver for each. Caregiver able to cue pt for technique, note safety issues and correct before pt begins movement. 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 Furniture and bed transfers with S. Stairs x2 trials 4 each, 6-inch height with S initially from therapist, caregiver on second trial. Provided education regarding energy conservation, encouraging pt to remain mobile in the home to maintain progress and continue increasing strength and balance, and allowing pt to do as much as possible even if requiring increased time. Pt returned to room and remained seated on EOB with alarm intact, caregiver present and all needs within reach.   Therapy Documentation Precautions:  Precautions Precautions: Fall Restrictions Weight Bearing Restrictions: No Pain: Pain Assessment Pain Assessment: No/denies pain   See Function Navigator for Current Functional Status.   Therapy/Group: Individual Therapy  Vista Lawmanlizabeth J Tygielski 02/01/2015, 12:03 PM

## 2015-02-01 NOTE — Progress Notes (Signed)
Occupational Therapy Session Note  Patient Details  Name: Valerie MangoSara H Chopra MRN: [id number] Date of Birth: [birth year]  Today's Date: 02/01/2015 OT Individual Time: 1130-1200 OT Individual Time Calculation (min): 30 min    Short Term Goals: Week 2:  OT Short Term Goal 1 (Week 2): Pt will complete 2 grooming tasks in standing with min assist OT Short Term Goal 2 (Week 2): STG = LTGs due to remaining LOS  Skilled Therapeutic Interventions/Progress Updates:    Pt seen for OT session focusing on functional ambulation, activity tolerance, and tub transfer. Pt sitting up in w/c upon arrival, agreeable to tx session. She ambulated throughout unit with steadying assist and RW. She required seated rest break after ~8870ft.  In ADL apartment, pt completed simulated tub bench transfer, able to manage B LEs over tub bench with increased time. Educated regarding safety awareness and DME recommendation for d/c. Pt returned to room at end of session, left sitting in w/c with seat alarm on and QRB with all needs in reach.   Therapy Documentation Precautions:  Precautions Precautions: Fall Restrictions Weight Bearing Restrictions: No Pain:   No/ denies pain  See Function Navigator for Current Functional Status.   Therapy/Group: Individual Therapy  Lewis, Zaeda Mcferran C 02/01/2015, 7:17 AM

## 2015-02-01 NOTE — Progress Notes (Signed)
Occupational Therapy Session Note  Patient Details  Name: Valerie Pham MRN: [id number] Date of Birth: 1927-10-29  Today's Date: 02/01/2015 OT Individual Time: 0900-1000 OT Individual Time Calculation (min): 60 min    Skilled Therapeutic Interventions/Progress Updates:    1:1 self care retraining at sink level (pt declined shower today when offered). Focus on bed mobility from flat bed, sit to stand, functional ambulation with RW to the bathroom. Pt presents with wet brief but able to void continently in toilet. Participated in bathing and dressing sitting in w/c at sink. Pt able to demonstrate familiar and basic ADL tasks sit to stand with supervision. However pt does demonstrate decr memory (recall of recent events, short term memory) and does require 24 hr supervision for safety. Pt ambulated with RW from room to chairs outside elevator with mod facilitation for trunk and hip extension and for attention to left foot. Pt requires cues for safety and positioning with turning.  Therapy Documentation Precautions:  Precautions Precautions: Fall Restrictions Weight Bearing Restrictions: No Pain:  no c/o pain   See Function Navigator for Current Functional Status.   Therapy/Group: Individual Therapy  Roney MansSmith, Christopher Hink [facility] 02/01/2015, 2:23 PM

## 2015-02-02 ENCOUNTER — Inpatient Hospital Stay (HOSPITAL_COMMUNITY): Payer: Medicare Other | Admitting: Occupational Therapy

## 2015-02-02 ENCOUNTER — Inpatient Hospital Stay (HOSPITAL_COMMUNITY): Payer: Medicare Other | Admitting: Physical Therapy

## 2015-02-02 ENCOUNTER — Inpatient Hospital Stay (HOSPITAL_COMMUNITY): Payer: Medicare Other | Admitting: Speech Pathology

## 2015-02-02 ENCOUNTER — Inpatient Hospital Stay (HOSPITAL_COMMUNITY): Payer: Medicare Other

## 2015-02-02 LAB — GLUCOSE, CAPILLARY
GLUCOSE-CAPILLARY: 147 mg/dL — AB (ref 65–99)
GLUCOSE-CAPILLARY: 99 mg/dL (ref 65–99)
GLUCOSE-CAPILLARY: 90 mg/dL (ref 65–99)
Glucose-Capillary: 135 mg/dL — ABNORMAL HIGH (ref 65–99)

## 2015-02-02 NOTE — Patient Care Conference (Signed)
Inpatient RehabilitationTeam Conference and Plan of Care Update Date: 02/02/2015   Time: 11:00 AM    Patient Name: Valerie Pham      Medical Record Number: [id number]  Date of Birth: November 05, 1927 Sex: Female         Room/Bed: 4W11C/4W11C-01 Payor Info: Payor: [place] / Plan: [facility] MEDICARE / Product Type: *No Product type* /    Admitting Diagnosis: R CVA  Admit Date/Time:  01/18/2015  7:29 PM Admission Comments: No comment available   Primary Diagnosis:  Right pontine cerebrovascular accident [facility]) Principal Problem: Right pontine cerebrovascular accident [facility])  Patient Active Problem List   Diagnosis Date Noted  . Inflammation of joint of finger   . Gout attack 01/19/2015  . Bacterial UTI   . Generalized OA   . DM type 2 with diabetic peripheral neuropathy ([place])   . Hypomagnesemia   . Urinary tract infection, site not specified   . Hemiparesis affecting nondominant side as late effect of stroke ([place])   . CVA (cerebral vascular accident) ([place]) 01/14/2015  . Essential hypertension 01/14/2015  . DM type 2 (diabetes mellitus, type 2) ([place]) 01/14/2015  . Hypothyroidism 01/14/2015  . Right pontine cerebrovascular accident [facility]) 01/14/2015    Expected Discharge Date: Expected Discharge Date: 02/03/15  Team Members Present: Physician leading conference: Dr. Alysia Penna Social Worker Present: Alfonse Alpers, LCSW Nurse Present: Heather Roberts, RN PT Present: Kem Parkinson, PT OT Present: Simonne Come, OT SLP Present: Windell Moulding, SLP PPS Coordinator present : Daiva Nakayama, RN, CRRN     Current Status/Progress Goal Weekly Team Focus  Medical   BP control improving,   home with family assist  D/C planning   Bowel/Bladder   continent of bowel and bladder.   Pt to remain continent of bowel and bladder manage b/b min assist  assess bowel pattern   Swallow/Nutrition/ Hydration             ADL's   supervision self-care tasks, min-supervision transfers   supervision overall  ADL retraining, pt/family education, LUE NMR   Mobility   min guard bed mobility, transfers, S/standbyA gait and stairs  S bed mobility, transfers, gait with LRAD, minA stairs  LLE NMR, activity tolerance, family training and HEP for d/c   Communication   mod I for speech intelligibility in conversations  mod I  long term goals met, complete family education prior to discharge tomorrow   Safety/Cognition/ Behavioral Observations            Pain   denies pain         Skin   skin CDI  remain free of skin breakdown min assist  assess skin q shift    Rehab Goals Patient on target to meet rehab goals: Yes Rehab Goals Revised: none *See Care Plan and progress notes for long and short-term goals.  Barriers to Discharge: still need physical assist for ADL and mobility    Possible Resolutions to Barriers:  has hired caregiver but ned to est 24/7 availability    Discharge Planning/Teaching Needs:  Family has decided to take pt home with family and paid caregiver to provide 24/7 at home.    Pt's dtrs and son have been here for family education for multiple sessions and all feel ready for pt to come home.   Team Discussion:  Pt with no major medical concerns and is on track for d/c 02-03-15.  Pt is mod I with speech in re: to intelligibility, but still 24/7 supervision  due to cognition.  Pt is at supervision overall for PT/OT, with min assist occasionally for OT tasks.  Family Education is completed.  HH and DME has been ordered and arranged.  Revisions to Treatment Plan:  none   Continued Need for Acute Rehabilitation Level of Care: The patient requires daily medical management by a physician with specialized training in physical medicine and rehabilitation for the following conditions: Daily direction of a multidisciplinary physical rehabilitation program to ensure safe treatment while eliciting the highest outcome that is of practical value to the patient.: Yes Daily  medical management of patient stability for increased activity during participation in an intensive rehabilitation regime.: Yes Daily analysis of laboratory values and/or radiology reports with any subsequent need for medication adjustment of medical intervention for : Neurological problems;Blood pressure problems;Diabetes problems  Sharnee Douglass, Silvestre Mesi 02/02/2015, 1:59 PM

## 2015-02-02 NOTE — Progress Notes (Signed)
Speech Language Pathology Discharge Summary  Patient Details  Name: Valerie Pham MRN: [id number] Date of Birth: 01-Feb-1927  Today's Date: 02/02/2015 SLP Individual Time: 0800-0900; [id number]  SLP Individual Time Calculation (min): 60 min; 23 min    Skilled Therapeutic Interventions:  Session 1: Pt was seen for skilled ST targeting communication goals.  SLP facilitated the session with a basic board game to address speech intelligibility at the conversational level with added distractions.  Pt initially required x1 supervision cue for intelligibility, but was otherwise mod I for intelligibility in conversations in a moderately distracting environment.  Pt was returned to room and left with call bell within reach, quick release belt donned, and chair alarm set.   Session 2: Pt was seen for skilled ST targeting family education.  Pt's daughter was present during today's therapy session.  SLP provided skilled education regarding speech intelligibility strategies to facilitate improved speech intelligibility in the setting of fatigue.  Overall, pt and pt's daughter both in agreement that pt is at goal level and near baseline for intelligibility at the conversational level.  All questions were answered to pt's and daughter's satisfaction at this time.  Both pt and daughter in agreement with no further ST follow up.  Pt was left in wheelchair with daughter at bedside.     Patient has met 1 of 1 long term goals.  Patient to discharge at overall Modified Independent level.  Reasons goals not met: n/a   Clinical Impression/Discharge Summary:  Pt made functional gains this reporting period and is discharging having met 1 out of 1 long term goals.  Pt is mod I for intelligibility in conversations and is at baseline for cognition.  Family education is complete at this time.  Pt is discharging home with 24/7 supervision from family and paid caregiver.  Do not recommend further ST services at next level of care.     Care Partner:  Caregiver Able to Provide Assistance: Yes  Type of Caregiver Assistance: Physical;Cognitive  Recommendation:  24 hour supervision/assistance      Equipment: none recommended by SLP    Reasons for discharge: Discharged from hospital   Patient/Family Agrees with Progress Made and Goals Achieved: Yes   Function:  Eating Eating                Cognition Comprehension Comprehension assist level: Follows basic conversation/direction with no assist  Expression   Expression assist level: Expresses complex ideas: With extra time/assistive device  Social Interaction Social Interaction assist level: Interacts appropriately 90% of the time - Needs monitoring or encouragement for participation or interaction.  Problem Solving Problem solving assist level: Solves basic 75 - 89% of the time/requires cueing 10 - 24% of the time  Memory Memory assist level: Recognizes or recalls 50 - 74% of the time/requires cueing 25 - 49% of the time   Emilio Math 02/02/2015, 12:36 PM

## 2015-02-02 NOTE — Progress Notes (Signed)
Occupational Therapy Session Note  Patient Details  Name: Valerie Pham MRN: [id number] Date of Birth: 15-Apr-1927  Today's Date: 02/02/2015 OT Individual Time: 1302-1410 OT Individual Time Calculation (min): 68 min    Short Term Goals: Week 2:  OT Short Term Goal 1 (Week 2): Pt will complete 2 grooming tasks in standing with min assist OT Short Term Goal 2 (Week 2): STG = LTGs due to remaining LOS  Skilled Therapeutic Interventions/Progress Updates:    Completed ADL retraining and family education at overall supervision.  Bathing and dressing completed in ADL apt with pt able to complete tub/shower transfer with supervision and min cues during bathing for safety.  Pt continues to require verbal cues for hand and RW placement during transfers and mobility, with pt's daughter Montey Horandra able to provide necessary question cues.  Pt's daughter asking appropriate questions throughout session and taking notes.  Completed 9 hole peg test with Rt: 40 seconds and Lt: 61 seconds.  Pt and daughter report no further questions or concerns.  Therapy Documentation Precautions:  Precautions Precautions: Fall Restrictions Weight Bearing Restrictions: No Pain: Pain Assessment Pain Assessment: No/denies pain Pain Score: 0-No pain  See Function Navigator for Current Functional Status.   Therapy/Group: Individual Therapy  Rosalio LoudHOXIE, Jaleya Pebley 02/02/2015, 3:39 PM

## 2015-02-02 NOTE — Progress Notes (Signed)
Occupational Therapy Discharge Summary  Patient Details  Name: Valerie Pham MRN: [id number] Date of Birth: [birth year]  Patient has met 11 of 11 long term goals due to improved activity tolerance, improved balance, postural control, ability to compensate for deficits, functional use of  LEFT upper and LEFT lower extremity, improved attention and improved awareness.  Patient to discharge at overall Supervision level.  Patient's care partner is independent to provide the necessary cognitive assistance at discharge.    Reasons goals not met: N/A  Recommendation:  Patient will benefit from ongoing skilled OT services in home health setting to continue to advance functional skills in the area of BADL and Reduce care partner burden.  Equipment: tub bench, BSC  Reasons for discharge: treatment goals met and discharge from hospital  Patient/family agrees with progress made and goals achieved: Yes  OT Discharge Precautions/Restrictions  Precautions Precautions: Fall Restrictions Weight Bearing Restrictions: No Pain Pain Assessment Pain Assessment: No/denies pain Pain Score: 0-No pain ADL  See Function Navigator Vision/Perception  Vision- History Baseline Vision/History: Wears glasses;Macular Degeneration;Cataracts Wears Glasses: At all times Patient Visual Report: No change from baseline Vision- Assessment Vision Assessment?: No apparent visual deficits  Cognition Overall Cognitive Status: History of cognitive impairments - at baseline Arousal/Alertness: Awake/alert Orientation Level: Oriented X4 Attention: Selective Selective Attention: Appears intact Memory: Impaired Memory Impairment: Decreased short term memory Awareness: Impaired Awareness Impairment: Intellectual impairment Problem Solving: Impaired Problem Solving Impairment: Functional basic Behaviors: Impulsive Safety/Judgment: Impaired Sensation Sensation Light Touch: Appears Intact Proprioception: Appears  Intact Coordination Gross Motor Movements are Fluid and Coordinated: Yes Fine Motor Movements are Fluid and Coordinated: No Finger Nose Finger Test: WNL Heel Shin Test: decreased excursion BLEs 9 Hole Peg Test: Rt: 40 seconds, Lt: 61 seconds Motor  Motor Motor: Hemiplegia;Abnormal postural alignment and control Motor - Discharge Observations: L hemiparesis, decreased L hip control, impaired postural control and balance strategies Mobility  Bed Mobility Bed Mobility: Rolling Right;Rolling Left;Supine to Sit;Sit to Supine Rolling Right: 6: Modified independent (Device/Increase time) Rolling Left: 6: Modified independent (Device/Increase time) Supine to Sit: 6: Modified independent (Device/Increase time) Sit to Supine: 6: Modified independent (Device/Increase time) Transfers Sit to Stand: 5: Supervision;With upper extremity assist;With armrests Sit to Stand Details: Verbal cues for precautions/safety;Verbal cues for technique  Trunk/Postural Assessment  Cervical Assessment Cervical Assessment: Within Functional Limits Thoracic Assessment Thoracic Assessment: Exceptions to [facility] (increased thoracic kyphosis, rounded shoulders) Lumbar Assessment Lumbar Assessment: Exceptions to [facility] (reduced lumbar lordosis) Postural Control Postural Control: Deficits on evaluation (Impaired stepping strategies, righting reactions)  Balance Balance Balance Assessed: Yes Static Sitting Balance Static Sitting - Balance Support: No upper extremity supported;Feet supported Static Sitting - Level of Assistance: 6: Modified independent (Device/Increase time) Dynamic Sitting Balance Dynamic Sitting - Balance Support: No upper extremity supported;Feet supported;During functional activity Dynamic Sitting - Level of Assistance: 6: Modified independent (Device/Increase time) Dynamic Sitting - Balance Activities: Reaching for objects;Forward lean/weight shifting;Reaching across midline Static Standing  Balance Static Standing - Balance Support: Bilateral upper extremity supported Static Standing - Level of Assistance: 5: Stand by assistance Dynamic Standing Balance Dynamic Standing - Balance Support: Bilateral upper extremity supported;During functional activity Dynamic Standing - Level of Assistance: 5: Stand by assistance Dynamic Standing - Balance Activities: Lateral lean/weight shifting;Reaching for objects;Reaching across midline Extremity/Trunk Assessment RUE Assessment RUE Assessment: Within Functional Limits LUE Assessment LUE Assessment: Exceptions to WFL LUE AROM (degrees) Overall AROM Left Upper Extremity: Deficits LUE Overall AROM Comments: shoulder flexion approx 120 degrees, elbow and wrist WNL LUE  PROM (degrees) LUE Overall PROM Comments: WFL LUE Strength LUE Overall Strength Comments: strength grossly 4-/5   See Function Navigator for Current Functional Status.  CHRISHA, VOGEL 02/02/2015, 3:49 PM

## 2015-02-02 NOTE — Discharge Summary (Signed)
Discharge summary job [id number]

## 2015-02-02 NOTE — Progress Notes (Signed)
[facility]ILIT[facility]     PROGRESS NOTE    Subjective/Complaints:  Patient unaware of discharge date tomorrow, states that she has a hired caregiver and her son is making her a ramp.  ROS: Denies CP, SOB, N/V/D  Objective: Vital Signs: Blood pressure 165/77, pulse 62, temperature 98.3 F (36.8 C), temperature source Oral, resp. rate 18, weight 97.7 kg (215 lb 6.2 oz), SpO2 95 %. No results found. No results for input(s): WBC, HGB, HCT, PLT in the last 72 hours. No results for input(s): NA, K, CL, GLUCOSE, BUN, CREATININE, CALCIUM in the last 72 hours.  Invalid input(s): CO CBG (last 3)   Recent Labs  02/01/15 1651 02/01/15 2124 02/02/15 0709  GLUCAP 158* 100* 135*    Wt Readings from Last 3 Encounters:  02/01/15 97.7 kg (215 lb 6.2 oz)  01/14/15 99 kg (218 lb 4.1 oz)    Physical Exam:  BP 165/77 mmHg  Pulse 62  Temp(Src) 98.3 F (36.8 C) (Oral)  Resp 18  Wt 97.7 kg (215 lb 6.2 oz)  SpO2 95% Constitutional: She appears well-developed and well-nourished. Vital signs reviewed HENT: Normocephalic and atraumatic. Conjunctivae and EOM are normal.  Neck: Normal range of motion. No thyromegaly present.  Cardiovascular: Normal rate and regular rhythm.  Systolic Murmur heard. Respiratory: Effort normal and breath sounds normal. No respiratory distress.  GI: Soft. Bowel sounds are normal. She exhibits no distension.  Musculoskeletal: + Edema but no TTP over second and fifth  digits of left upper extremity (improving)-centered at PIP. Increased ROM  Neurological: She is alert and oriented.  Follows commands.  Fair awareness of deficits Left hemifacial weakness, speech dysarthric Motor: LUE: Shoulder abduction 3/5, elbow flexion and extension 4/5, finger grip 4/5  decreased movement in first finger due to swelling LLE: 4/5  RUE/RLE: 5/5  Skin: Skin is warm and dry.  Psychiatric: She has a normal mood and affect. Her behavior is normal.  Reasonable insight and awareness  Assessment/Plan: 1. Functional deficits secondary to right pontine infarct , embolic, causing left hemiparesis  which require 3+ hours per day of interdisciplinary therapy in a comprehensive inpatient rehab setting. Physiatrist is providing close team supervision and 24 hour management of active medical problems listed below. Physiatrist and rehab team continue to assess barriers to discharge/monitor patient progress toward functional and medical goals.  Function:  Bathing Bathing position Bathing activity did not occur: Refused Position: Wheelchair/chair at sink  Bathing parts Body parts bathed by patient: Left arm, Right arm, Chest, Abdomen, Front perineal area, Right upper leg, Left upper leg, Right lower leg, Left lower leg, Buttocks Body parts bathed by helper: Buttocks, Back  Bathing assist Assist Level: Supervision or verbal cues      Upper Body Dressing/Undressing Upper body dressing   What is the patient wearing?: Pull over shirt/dress Bra - Perfomed by patient: Thread/unthread right bra strap, Thread/unthread left bra strap Bra - Perfomed by helper: Hook/unhook bra (pull down sports bra) Pull over shirt/dress - Perfomed by patient: Thread/unthread right sleeve, Thread/unthread left sleeve, Put head through opening, Pull shirt over trunk Pull over shirt/dress - Perfomed by helper: Pull shirt over trunk        Upper body assist Assist Level: Supervision or verbal cues   Set up : To obtain clothing/put away  Lower Body Dressing/Undressing Lower body dressing   What is the patient wearing?: Pants, Ted Hose, Shoes, Non-skid slipper socks     Pants- Performed by patient: Thread/unthread right pants  leg, Thread/unthread left pants leg, Pull pants up/down Pants- Performed by helper: Pull pants up/down Non-skid slipper socks- Performed by patient: Don/doff right sock, Don/doff left sock   Socks - Performed by patient: Don/doff right sock,  Don/doff left sock Socks - Performed by helper: Don/doff right sock, Don/doff left sock Shoes - Performed by patient: Don/doff right shoe, Don/doff left shoe, Fasten right, Fasten left Shoes - Performed by helper: Fasten left     TED Hose - Performed by patient: Don/doff right TED hose, Don/doff left TED hose TED Hose - Performed by helper: Don/doff left TED hose, Don/doff right TED hose  Lower body assist Assist for lower body dressing: Supervision or verbal cues, Set up   Set up : To obtain clothing/put away  Toileting Toileting Toileting activity did not occur: Refused Toileting steps completed by patient: Adjust clothing prior to toileting, Performs perineal hygiene, Adjust clothing after toileting Toileting steps completed by helper: Adjust clothing after toileting Toileting Assistive Devices: Grab bar or rail  Toileting assist Assist level: Supervision or verbal cues   Transfers Chair/bed transfer   Chair/bed transfer method: Ambulatory Chair/bed transfer assist level: Supervision or verbal cues Chair/bed transfer assistive device: Environmental manager lift: Ecologist     Max distance: 100 Assist level: Supervision or verbal cues   Wheelchair   Type: Manual Max wheelchair distance: 100 Assist Level: Touching or steadying assistance (Pt > 75%)  Cognition Comprehension Comprehension assist level: Follows basic conversation/direction with no assist  Expression Expression assist level: Expresses basic needs/ideas: With extra time/assistive device  Social Interaction Social Interaction assist level: Interacts appropriately 90% of the time - Needs monitoring or encouragement for participation or interaction.  Problem Solving Problem solving assist level: Solves basic 75 - 89% of the time/requires cueing 10 - 24% of the time  Memory Memory assist level: Recognizes or recalls 50 - 74% of the time/requires cueing 25 - 49% of the time    Medical Problem List  and Plan: 1.Left side weakness with gait instability secondary to right pontine infarct felt to be embolic Team conference today please see physician documentation under team conference tab, met with team face-to-face to discuss problems,progress, and goals. Formulized individual treatment plan based on medical history, underlying problem and comorbidities. 2.DVT Prophylaxis/Anticoagulation: Eliquis to cover both CVA and DVT prophyllaxis 3. Pain Management: Tylenol as needed 4. Hypertension. Monitor with increased mobility  Amlodipine 6m started 12/21-  increased to 118mon 12/28  Losartan increased to 50 on 12/24, increased to 100 12/26 (home dose 100)  Metoprolol 50 increased to 100 BID (home dose 100) on 12/31, HR down in 60s as expected  165/77 today- multiple recent changes will d/c home on current regimen with PCP f/u 5. Neuropsych: This patient is capable of making decisions on her own behalf. 6. Skin/Wound Care: Routine skin checks 7. Fluids/Electrolytes/Nutrition: Routine I&O  8. Diabetes mellitus with peripheral neuropathy. Hemoglobin A1c 7.6. Check blood sugars before meals and at bedtime.   Patient on Glucotrol 10 mg daily and glucophage 500 mg twice daily at home.  Overall control is adequate, d/c home on current regimen CBG (last 3)   Recent Labs  02/01/15 1651 02/01/15 2124 02/02/15 0709  GLUCAP 158* 100* 135*     Diabetic teaching 9. Hyperlipidemia. Lipitor 10. Hypothyroidism. Synthroid           LOS (Days) 15 A FACE TO FACE EVALUATION WAS PERFORMED  KICharlett Blake/04/2015 8:19 AM

## 2015-02-02 NOTE — Progress Notes (Signed)
Physical Therapy Discharge Summary  Patient Details  Name: Valerie Pham MRN: [id number] Date of Birth: 1927-07-21  Today's Date: 02/02/2015 PT Individual Time: 1410-1505 PT Individual Time Calculation (min): 55 min    Patient has met 10 of 11 long term goals due to improved activity tolerance, improved balance, improved postural control, increased strength, decreased pain, ability to compensate for deficits, functional use of  left upper extremity and left lower extremity, improved attention, improved awareness and improved coordination.  Patient to discharge at an ambulatory level Supervision.   Patient's care partner (including paid caregiver as well as daughters) is independent to provide the necessary physical and cognitive assistance at discharge.  Reasons goals not met: W/c propulsion goal unmet due to UE strength and endurance deficits; pt only able to propel 50' before fatigued.   Recommendation:  Patient will benefit from ongoing skilled PT services in home health setting to continue to advance safe functional mobility, address ongoing impairments in balance, strength, coordination, activity tolerance, and minimize fall risk.  Equipment: RW, w/c  Reasons for discharge: treatment goals met and discharge from hospital  Patient/family agrees with progress made and goals achieved: Yes  PT Discharge Precautions/Restrictions Precautions Precautions: Fall Restrictions Weight Bearing Restrictions: No Pain Pain Assessment Pain Assessment: No/denies pain Pain Score: 0-No pain Vision/Perception    No change from baseline Cognition Overall Cognitive Status: History of cognitive impairments - at baseline Arousal/Alertness: Awake/alert Orientation Level: Oriented X4 Attention: Selective Selective Attention: Appears intact Memory: Impaired Memory Impairment: Decreased short term memory Awareness: Impaired Awareness Impairment: Intellectual impairment Problem Solving:  Impaired Problem Solving Impairment: Functional basic Behaviors: Impulsive Safety/Judgment: Impaired Sensation Sensation Light Touch: Appears Intact Proprioception: Appears Intact Coordination Gross Motor Movements are Fluid and Coordinated: Yes Fine Motor Movements are Fluid and Coordinated: No Finger Nose Finger Test: WNL Heel Shin Test: decreased excursion BLEs 9 Hole Peg Test: Rt: 40 seconds, Lt: 61 seconds Motor  Motor Motor: Hemiplegia;Abnormal postural alignment and control Motor - Discharge Observations: L hemiparesis, decreased L hip control, impaired postural control and balance strategies  Mobility Bed Mobility Bed Mobility: Rolling Right;Rolling Left;Supine to Sit;Sit to Supine Rolling Right: 6: Modified independent (Device/Increase time) Rolling Left: 6: Modified independent (Device/Increase time) Supine to Sit: 6: Modified independent (Device/Increase time) Sit to Supine: 6: Modified independent (Device/Increase time) Transfers Transfers: Yes Sit to Stand: 5: Supervision;With upper extremity assist;With armrests Sit to Stand Details: Verbal cues for precautions/safety;Verbal cues for technique Stand Pivot Transfers: 5: Supervision Stand Pivot Transfer Details: Verbal cues for sequencing;Verbal cues for technique;Verbal cues for precautions/safety Locomotion  Ambulation Ambulation: Yes Ambulation/Gait Assistance: 5: Supervision Ambulation Distance (Feet): 100 Feet Assistive device: Rolling walker Ambulation/Gait Assistance Details: Verbal cues for technique;Verbal cues for precautions/safety Gait Gait: Yes Gait Pattern: Impaired Gait Pattern: Trendelenburg;Shuffle;Poor foot clearance - left;Left flexed knee in stance;Decreased stride length;Decreased stance time - left Gait velocity: decreased for age/gender norms Stairs / Additional Locomotion Stairs: Yes Stairs Assistance: 5: Supervision Stairs Assistance Details: Verbal cues for technique;Verbal cues for  precautions/safety;Verbal cues for sequencing Stair Management Technique: Two rails;Step to pattern;Forwards Number of Stairs: 12 Height of Stairs: 6 Ramp: 5: Supervision Curb: 5: Psychiatric nurse: Yes Wheelchair Assistance: 5: Investment banker, operational Details: Verbal cues for technique;Verbal cues for Astronomer: Both upper extremities Wheelchair Parts Management: Needs assistance Distance: 50  Trunk/Postural Assessment  Cervical Assessment Cervical Assessment: Within Functional Limits Thoracic Assessment Thoracic Assessment: Exceptions to [facility] (increased thoracic kyphosis, rounded shoulders) Lumbar Assessment Lumbar Assessment: Exceptions to  WFL (reduced lumbar lordosis) Postural Control Postural Control: Deficits on evaluation (Impaired stepping strategies, righting reactions)  Balance Balance Balance Assessed: Yes Static Sitting Balance Static Sitting - Balance Support: No upper extremity supported;Feet supported Static Sitting - Level of Assistance: 6: Modified independent (Device/Increase time) Dynamic Sitting Balance Dynamic Sitting - Balance Support: No upper extremity supported;Feet supported;During functional activity Dynamic Sitting - Level of Assistance: 6: Modified independent (Device/Increase time) Dynamic Sitting - Balance Activities: Reaching for objects;Forward lean/weight shifting;Reaching across midline Static Standing Balance Static Standing - Balance Support: Bilateral upper extremity supported Static Standing - Level of Assistance: 5: Stand by assistance Dynamic Standing Balance Dynamic Standing - Balance Support: Bilateral upper extremity supported;During functional activity Dynamic Standing - Level of Assistance: 5: Stand by assistance Dynamic Standing - Balance Activities: Lateral lean/weight shifting;Reaching for objects;Reaching across midline Extremity Assessment  RUE Assessment RUE  Assessment: Within Functional Limits LUE Assessment LUE Assessment: Exceptions to WFL LUE AROM (degrees) Overall AROM Left Upper Extremity: Deficits LUE Overall AROM Comments: shoulder flexion approx 120 degrees, elbow and wrist WNL LUE PROM (degrees) LUE Overall PROM Comments: WFL LUE Strength LUE Overall Strength Comments: strength grossly 4-/5 RLE Assessment RLE Assessment: Within Functional Limits LLE Assessment LLE Assessment: Exceptions to [facility] (4/5 hip flexion, 4+/5 knee extension/flexion, 5/5 ankle dorsiflexion/plantarflexion)  Skilled Therapeutic Intervention: Pt received seated in w/c in apartment with daughter present and handoff from OT after previous session. Discussed home setup with daughter with recent changes in home entry, handrails installed and new stairs, and small thresholds throughout the house. Also discussed use of bedrail to prevent pt from exiting bed on L side, allow pt to exit on R and use BSC. Performed ambulation in apartment to bed with RW and S, sit <>supine mod I with increased time and multiple attempts. Car transfer to elevated seat height to simulate other daughter's SUV that they may be driving pt home in tomorrow; able to perform with S and verbal cues for sequencing and safety. Gait x100' with RW in hallway; daughter providing close S. Stairs x12 on 3- and 6-inch height with B handrails and min guard. Verbal cues for proper step sequencing during descent. Gait over uneven surface x10' with close S and verbal cues for RW placement. Picked up object from floor with RW RUE support and close S. Returned to room in w/c totalA and remained seated in w/c all needs in reach; daughter verbalized understanding to don safety belt prior to leaving pt alone.    See Function Navigator for Current Functional Status.  Benjiman Core Tygielski 02/02/2015, 3:55 PM

## 2015-02-02 NOTE — Consult Note (Signed)
NEUROCOGNITIVE TESTING - [facility]   According to medical records, Ms. Valerie Pham was previously seen by my colleague, Dr. Wylene Simmer for cognitive assessment. Results were as follows:   Ms. 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 stroke).  Her greatest areas of weakness were attention and processing speed; abilities that are commonly impacted post-stroke regardless of the specific location of the stroke and which could also be impacted by fatigue or pain.  Regarding memory, she performed better when she heard information multiple times and repeated it out loud.  Ms. Valerie Pham typically freely recalled a majority of the limited information that she initially encoded following delays and was also aided by provision of recognition cues.  In this way, her memory struggles seemed to be more related to inattention initially than to a true memory deficit.  She demonstrated relative strengths in visuospatial abilities (despite vision problems), and language skills.   The patient was referred for repeat neuropsychological consultation given the possibility of cognitive sequelae subsequent to the current medical status and in order to assist in treatment planning.   PROCEDURES: [2 units of 96118]  Diagnostic Interview Medical record review Behavioral observations   TEST RESULTS:   RBANS Indices Scaled Score Percentile Description  Immediate Memory  78 7 Mildly impaired  Visuospatial/Constructional 87 19 Below average  Language 99 47 Average  Attention 85 16 Below average  Delayed Memory 60 <1 Markedly impaired  Total Score 77 6 Mildly impaired    RBANS Subtests Raw Score Percentile Description  List Learning 17 8 Mildly impaired  Story Memory 11 13 Below average  Figure Copy 12 <1 Markedly impaired   Line Orientation 18 81 Above average   Picture Naming 10 82 Above average  Semantic Fluency 16 34 Average  Digit Span 12 90 Above average  Coding D/C n/a n/a   List Recall 1 10 Below average  List Recognition 16 <1 Markedly impaired   Story Recall 3 5 Moderately impaired  Figure recall 3 2 Markedly impaired   Cognitive Evaluation: Test results revealed intact (or better) spatial judgment, naming, category fluency, and immediate auditory attention. Aspects of learning and memory were reduced (or impaired), particularly delayed recall and recognition. Constructional praxis was also poor.    IMPRESSION: Mrs. Valerie Pham' performance indicated predominant issues with aspects of learning and memory as well as constructional praxis. Compared to her previous evaluation, improvements were noted in unstructured and contextual verbal learning, semantic fluency, and attention. Declines were observed in verbal recognition. All other scores were roughly equivalent.   Ms. Valerie Pham' performance is most consistent with a diagnosis of Mild Neurocognitive Disorder (i.e., mild cognitive impairment), predominantly amnestic. The most likely etiology continues ot be cerebrovascular compromise and I am happy to note the improvements. I am unsure why verbal recognition declined and it could be incidental. Regardless, increased supervision upon discharge appears warranted to decrease the risk of adverse events. Also, repeat evaluation in one 8-12 months appears appropriate to assess for further interval change.   Recommendations for discharge planning:  . Complete a comprehensive neuropsychological evaluation as an outpatient in 8-12 months to assess for interval change. This can be done through Valerie Fisherman, PsyD by calling the following number: [phone].  . Establish long-term follow-up care with a provider knowledgeable in stroke.  Marland Kitchen Neuroimaging can show time-dependent vulnerability to damage in specific regions, and lesions often evolve ever weeks or  months. As  such, repeat head CT or brain MRI in 2-3 months might be beneficial.   . Maintain engagement in mentally, physically and cognitively stimulating activities.  . Strive to maintain a healthy lifestyle (e.g., proper diet and exercise) in order to promote physical, cognitive and emotional health.     Valerie Pham, Psy.D.  Clinical Neuropsychologist  Rehabilitation Psychologist

## 2015-02-02 NOTE — Progress Notes (Signed)
Social Work Patient ID: Adline MangoSara H Cogbill, female   DOB: 02-17-1927, 82 y.o.   MRN: [id number]   Pt's family has decided to take her home with family and paid caregiver providing 24/7 supervision.  Family and caregiver have attended family education.  CSW plans to meet with family today at 1pm to confirm d/c plans and needs.  DME is being ordered and HH arranged.  CSW to continue to follow.

## 2015-02-03 ENCOUNTER — Telehealth: Payer: Self-pay | Admitting: *Deleted

## 2015-02-03 LAB — GLUCOSE, CAPILLARY
Glucose-Capillary: 133 mg/dL — ABNORMAL HIGH (ref 65–99)
Glucose-Capillary: 146 mg/dL — ABNORMAL HIGH (ref 65–99)

## 2015-02-03 MED ORDER — LEVOTHYROXINE SODIUM 75 MCG PO TABS: 75.0000 ug | ORAL_TABLET | Freq: Every day | ORAL | Status: AC

## 2015-02-03 MED ORDER — AMLODIPINE BESYLATE 10 MG PO TABS: 10.0000 mg | ORAL_TABLET | Freq: Every day | ORAL | Status: AC

## 2015-02-03 MED ORDER — BRIMONIDINE TARTRATE 0.2 % OP SOLN: 1.0000 [drp] | Freq: Two times a day (BID) | OPHTHALMIC | Status: AC

## 2015-02-03 MED ORDER — LOSARTAN POTASSIUM 100 MG PO TABS: 100.0000 mg | ORAL_TABLET | Freq: Every day | ORAL | Status: AC

## 2015-02-03 MED ORDER — APIXABAN 5 MG PO TABS: 5.0000 mg | ORAL_TABLET | Freq: Two times a day (BID) | ORAL | Status: AC

## 2015-02-03 MED ORDER — GLIPIZIDE 10 MG PO TABS: 10.0000 mg | ORAL_TABLET | Freq: Every day | ORAL | Status: AC

## 2015-02-03 MED ORDER — ATORVASTATIN CALCIUM 20 MG PO TABS: 20.0000 mg | ORAL_TABLET | Freq: Every day | ORAL | Status: DC

## 2015-02-03 MED ORDER — METFORMIN HCL 500 MG PO TABS: 500.0000 mg | ORAL_TABLET | Freq: Two times a day (BID) | ORAL | Status: AC

## 2015-02-03 MED ORDER — OCUVITE PO TABS: 1.0000 | ORAL_TABLET | Freq: Every day | ORAL | Status: AC

## 2015-02-03 MED ORDER — METOPROLOL TARTRATE 100 MG PO TABS: 100.0000 mg | ORAL_TABLET | Freq: Two times a day (BID) | ORAL | Status: AC

## 2015-02-03 NOTE — Telephone Encounter (Signed)
Called because pharmacy was not going to give her the eliquis because it needs a prior auth.  Message being sent to Tressa Busmanan Angiuilli PA to initiate prior auth.  [facility] [place] and they do give a 3 ay supply.

## 2015-02-03 NOTE — Progress Notes (Signed)
02/03/15 1220 nursing Patient discharged to home per wheelchair accompanied by NT and daughter. Discharge instructions done by PA. RN reminded daughter  of medications to be taken  this pm. No further questions.

## 2015-02-03 NOTE — Progress Notes (Signed)
[place] [facility]     PROGRESS NOTE    Subjective/Complaints:  Patient unaware of discharge date today Appreciate Neuropsych eval  ROS: Denies CP, SOB, N/V/D  Objective: Vital Signs: Blood pressure 139/67, pulse 68, temperature 98.1 F (36.7 C), temperature source Oral, resp. rate 19, weight 96.435 kg (212 lb 9.6 oz), SpO2 94 %. No results found. No results for input(s): WBC, HGB, HCT, PLT in the last 72 hours. No results for input(s): NA, K, CL, GLUCOSE, BUN, CREATININE, CALCIUM in the last 72 hours.  Invalid input(s): CO CBG (last 3)   Recent Labs  02/02/15 1626 02/02/15 2120 02/03/15 0640  GLUCAP 99 90 133*    Wt Readings from Last 3 Encounters:  02/02/15 96.435 kg (212 lb 9.6 oz)  01/14/15 99 kg (218 lb 4.1 oz)    Physical Exam:  BP 139/67 mmHg  Pulse 68  Temp(Src) 98.1 F (36.7 C) (Oral)  Resp 19  Wt 96.435 kg (212 lb 9.6 oz)  SpO2 94% Constitutional: She appears well-developed and well-nourished. Vital signs reviewed HENT: Normocephalic and atraumatic. Conjunctivae and EOM are normal.  Neck: Normal range of motion. No thyromegaly present.  Cardiovascular: Normal rate and regular rhythm.  Systolic Murmur heard. Respiratory: Effort normal and breath sounds normal. No respiratory distress.  GI: Soft. Bowel sounds are normal. She exhibits no distension.  Musculoskeletal: + Edema but no TTP over second and fifth  digits of left upper extremity (improving)-centered at PIP. Increased ROM  Neurological: She is alert and oriented.  Follows commands.  Fair awareness of deficits Left hemifacial weakness, speech dysarthric Motor: LUE: Shoulder abduction 3/5, elbow flexion and extension 4/5, finger grip 4/5  decreased movement in first finger due to swelling LLE: 4/5  RUE/RLE: 5/5  Skin: Skin is warm and dry.  Psychiatric: She has a normal mood and affect. Her behavior is normal. Reasonable insight and  awareness  Assessment/Plan: 1. Functional deficits secondary to right pontine infarct , embolic, causing left hemiparesis Stable for D/C today F/u PCP in 1-2 weeks F/u PM&R 3 weeks See D/C summary See D/C instructions  Function:  Bathing Bathing position Bathing activity did not occur: Refused Position: Shower  Bathing parts Body parts bathed by patient: Left arm, Right arm, Chest, Abdomen, Front perineal area, Right upper leg, Left upper leg, Right lower leg, Left lower leg, Buttocks Body parts bathed by helper: Buttocks, Back  Bathing assist Assist Level: Supervision or verbal cues      Upper Body Dressing/Undressing Upper body dressing   What is the patient wearing?: Pull over shirt/dress Bra - Perfomed by patient: Thread/unthread right bra strap, Thread/unthread left bra strap, Hook/unhook bra (pull down sports bra) Bra - Perfomed by helper: Hook/unhook bra (pull down sports bra) Pull over shirt/dress - Perfomed by patient: Thread/unthread right sleeve, Thread/unthread left sleeve, Put head through opening, Pull shirt over trunk Pull over shirt/dress - Perfomed by helper: Pull shirt over trunk        Upper body assist Assist Level: Supervision or verbal cues   Set up : To obtain clothing/put away  Lower Body Dressing/Undressing Lower body dressing   What is the patient wearing?: Pants, Ted Hose, Shoes, Non-skid slipper socks     Pants- Performed by patient: Thread/unthread right pants leg, Thread/unthread left pants leg, Pull pants up/down Pants- Performed by helper: Pull pants up/down Non-skid slipper socks- Performed by patient: Don/doff right sock, Don/doff left sock   Socks - Performed by patient: Don/doff right sock, Don/doff  left sock Socks - Performed by helper: Don/doff right sock, Don/doff left sock Shoes - Performed by patient: Don/doff right shoe, Don/doff left shoe, Fasten right, Fasten left Shoes - Performed by helper: Fasten left     TED Hose -  Performed by patient: Don/doff right TED hose, Don/doff left TED hose TED Hose - Performed by helper: Don/doff left TED hose, Don/doff right TED hose  Lower body assist Assist for lower body dressing: Supervision or verbal cues, Set up   Set up : To obtain clothing/put away  Toileting Toileting Toileting activity did not occur: Refused Toileting steps completed by patient: Adjust clothing prior to toileting, Performs perineal hygiene, Adjust clothing after toileting Toileting steps completed by helper: Adjust clothing after toileting Toileting Assistive Devices: Grab bar or rail  Toileting assist Assist level: Supervision or verbal cues   Transfers Chair/bed transfer   Chair/bed transfer method: Ambulatory Chair/bed transfer assist level: Supervision or verbal cues Chair/bed transfer assistive device: Environmental manager lift: Ecologist     Max distance: 100 Assist level: Supervision or verbal cues   Wheelchair   Type: Manual Max wheelchair distance: 50 Assist Level: Supervision or verbal cues  Cognition Comprehension Comprehension assist level: Follows basic conversation/direction with no assist  Expression Expression assist level: Expresses complex ideas: With extra time/assistive device  Social Interaction Social Interaction assist level: Interacts appropriately 90% of the time - Needs monitoring or encouragement for participation or interaction.  Problem Solving Problem solving assist level: Solves basic 75 - 89% of the time/requires cueing 10 - 24% of the time  Memory Memory assist level: Recognizes or recalls 50 - 74% of the time/requires cueing 25 - 49% of the time    Medical Problem List and Plan: 1.Left side weakness with gait instability secondary to right pontine infarct felt to be embolic Team conference today please see physician documentation under team conference tab, met with team face-to-face to discuss problems,progress, and goals.  Formulized individual treatment plan based on medical history, underlying problem and comorbidities. 2.DVT Prophylaxis/Anticoagulation: Eliquis to cover both CVA and DVT prophyllaxis 3. Pain Management: Tylenol as needed 4. Hypertension. Monitor with increased mobility  Amlodipine 66m started 12/21-  increased to 145mon 12/28  Losartan increased to 50 on 12/24, increased to 100 12/26 (home dose 100)  Metoprolol 50 increased to 100 BID (home dose 100) on 12/31, HR down in 60s as expected  139/67 today- flowsheet reviewed BP normalizing yesterday 5. Neuropsych: This patient is capable of making decisions on her own behalf. 6. Skin/Wound Care: Routine skin checks 7. Fluids/Electrolytes/Nutrition: Routine I&O  8. Diabetes mellitus with peripheral neuropathy. Hemoglobin A1c 7.6. Check blood sugars before meals and at bedtime.   Patient on Glucotrol 10 mg daily and glucophage 500 mg twice daily at home.  Overall control is adequate, d/c home on current regimen CBG (last 3)   Recent Labs  02/02/15 1626 02/02/15 2120 02/03/15 0640  GLUCAP 99 90 133*     Diabetic teaching 9. Hyperlipidemia. Lipitor 10. Hypothyroidism. Synthroid           LOS (Days) 16 A FACE TO FACE EVALUATION WAS PERFORMED  KICharlett Blake/05/2015 7:16 AM

## 2015-02-03 NOTE — Discharge Summary (Signed)
NAMVilma Prader:  Mcarthy, Deania                ACCOUNT NO.:  [id number]  MEDICAL RECORD NO.:  [id number]  LOCATION:  [id number]                        FACILITY:  [facility]  PHYSICIAN:  Erick ColaceAndrew E. Kirsteins, M.D.DATE OF BIRTH:  [birth year]  DATE OF ADMISSION:  01/18/2015 DATE OF DISCHARGE:  02/03/2015                              DISCHARGE SUMMARY   DISCHARGE DIAGNOSES: 1. Left-sided weakness and gait instability secondary to right pontine     infarct. 2. Eliquis for deep vein thrombosis prophylaxis as well as     cerebrovascular accident prophylaxis. 3. Hypertension. 4. Diabetes mellitus with peripheral neuropathy.  HISTORY OF PRESENT ILLNESS:  This is an 82 year old right-handed female with history of hypertension and atrial flutter, maintained on aspirin. Uses a cane, [place].  She has a caregiver 5 hours a day.  [facility] January 13, [facility] with left-sided weakness, slurred speech.  The patient did not receive tPA.  MRI at outside hospital revealed right pontine CVA.  Echocardiogram showed ejection fraction 70% grade 1 diastolic dysfunction.  No cardiac source of emboli.  MRA of the head and neck without large vessel occlusion or stenosis.  Neurology follow up placed on Eliquis for CVA prophylaxis. Tolerating a regular consistency diet.  Noted unwitnessed fall on January 18, 2015, found on the floor, complaints of left index finger. X-rays showed some soft tissue swelling without fracture.  Physical and occupational therapy ongoing.  The patient was admitted for comprehensive rehab program.  PAST MEDICAL HISTORY:  See discharge diagnoses.  SOCIAL HISTORY:  Lives alone, has a caregiver 5 hours a day.  FUNCTIONAL STATUS:  Upon admission to rehab services was minimal assist 15 feet rolling walker, +2 physical assist sit to stand, min to mod assist, activities of daily living.  PHYSICAL EXAMINATION:  VITAL SIGNS:  Blood pressure 161/68, pulse 69, temperature  98, respirations 18. GENERAL:  This was an alert female, oriented x3. LUNGS:  Clear to auscultation without wheeze. CARDIAC:  Regular rate and rhythm without murmur. ABDOMEN:  Soft, nontender.  Good bowel sounds. NEURO:  Fair awareness of deficits.  Mood and affect normal.  REHABILITATION HOSPITAL COURSE:  The patient was admitted to inpatient rehab services with therapies initiated on a 3-hour daily basis consisting of physical therapy, occupational therapy, speech therapy, and rehabilitation nursing.  The following issues were addressed during the patient's rehabilitation stay.  Pertaining to Ms. Mercy RidingBurgess' left- sided weakness, gait instability after right pontine infarct remained stable.  She would follow up Neurology Services.  She remained on Eliquis.  Blood pressure is well controlled on Norvasc as well as Cozaar and Lopressor with no orthostatic changes.  She would follow up with her primary MD.  Diabetes mellitus, peripheral neuropathy.  Hemoglobin A1c 7.6, she remained on Glucotrol as well as Glucophage as prior to admission with diabetic teaching.  Mild constipation resolved with laxative assistance.  Hormone supplement ongoing for hypothyroidism. The patient received weekly collaborative interdisciplinary team conferences to discuss estimated length of stay, family teaching, and any barriers to discharge.  Sit to stand without upper extremity support was standby assist.  Stair training on section stairs, 4 trials, 4 stairs each to simulate home  environment using bilateral hand rails and close supervision.  Ambulating 100 feet x2, rolling walker.  Car transfers x2 including stand pivot and ambulating up to the car with a rolling walker close supervision with caregiver support.  She was able to gather her belongings for activities of daily living and homemaking. Follow up speech therapy for some dysarthric speech.  She was able to communicate her needs without difficulty.   Followed basic conversation direction with no assistance.  Full family teaching was completed and plan discharge to home.  DISCHARGE MEDICATIONS: 1. Norvasc 10 mg p.o. daily. 2. Eliquis 5 mg p.o. every 12 hours. 3. Lipitor 20 mg p.o. daily. 4. Ocuvite 1 tablet daily. 5. Alphagan ophthalmic solution 0.2% one drop both eyes twice daily. 6. Cosopt ophthalmic solution 1 drop both eyes twice daily. 7. Glucotrol 10 mg p.o. daily. 8. Synthroid 75 mcg p.o. daily. 9. Cozaar 100 mg p.o. daily. 10.Glucophage 500 mg p.o. b.i.d. 11.Lopressor 100 mg p.o. b.i.d.  DIET:  Diabetic diet.  FOLLOWUP:  The patient would follow up with Dr. Claudette Laws at the outpatient rehab service office as directed; Dr. Delia Heady, Neurology Services, call for appointment 2 months; Dr. Lois Huxley at [facility].  SPECIAL INSTRUCTIONS:  Ongoing therapies arranged as per rehab services.     Mariam Dollar, P.A.   ______________________________ Erick Colace, M.D.    DA/MEDQ  D:  02/02/2015  T:  02/02/2015  Job:  [id number]  cc:   Erick Colace, M.D. Pramod P. Pearlean Brownie, MD Carmin Richmond, MD

## 2015-02-04 NOTE — Progress Notes (Signed)
Social Work Patient ID: Adline MangoSara H Whidden, female   DOB: 01-07-1928, 82 y.o.   MRN: [id number]   CSW spoke with pt and dtr, Andra after team conference on 02-02-15 and they feel prepared to d/c on 02-03-15.  CSW reviewed f/u care and DME ordered.  Montey Horandra would like to take most of the DME home today.  CSW ordered and it was delivered to the room.  HH arranged through [facility] per family request.  Other questions answered and plan for d/c on 02-03-15 confirmed.  Andra plans to stay for the afternoon to complete more family education.  CSW remains available as needed.

## 2015-02-04 NOTE — Telephone Encounter (Signed)
Pt's daughter called stating that pt was running out of eliquis today. PA has been faxed to [phone]-given by Boneta LucksJenny from In-pt rehab. Pt was able to get a 30 day supply while PA was being worked on.

## 2015-02-04 NOTE — Progress Notes (Signed)
Social Work Discharge Note  The overall goal for the admission was met for:   Discharge location: Yes - home with family/paid caregiver to provide 24/7 supervision  Length of Stay: Yes - 16 days  Discharge activity level: Yes - supervision  Home/community participation: Yes  Services provided included: MD, RD, PT, OT, SLP, RN, Pharmacy, Neuropsych and SW  Financial Services: Private Insurance: [employer] Medicare  Follow-up services arranged: Home Health: PT/OT/RN, DME: 20"x18" wheelchair with basic cushion; tub bench; bedside commode; rolling walker and Patient/Family request agency HH: [facility], DME: [facility]  Comments (or additional information):  Pt to go home with family members and paid caregiver to provide 24/7 supervision at pt's home.  Family has prepared the home and DME was set up prior to d/c.  HH to follow for PT/OT/RN.  Family education was completed and they are prepared to care for her at home.  Patient/Family verbalized understanding of follow-up arrangements: Yes  Individual responsible for coordination of the follow-up plan: Andra Burks - dtr  Confirmed correct DME delivered: Prevatt, Jennifer Capps 02/04/2015    Prevatt, Jennifer Capps 

## 2015-02-10 ENCOUNTER — Encounter (INDEPENDENT_AMBULATORY_CARE_PROVIDER_SITE_OTHER): Payer: Medicare Other | Admitting: Ophthalmology

## 2015-02-24 NOTE — Telephone Encounter (Signed)
prioir auth for eliquis was approved through 01/29/2016

## 2015-03-09 DIAGNOSIS — E119 Type 2 diabetes mellitus without complications: Secondary | ICD-10-CM | POA: Diagnosis not present

## 2015-03-09 DIAGNOSIS — I10 Essential (primary) hypertension: Secondary | ICD-10-CM

## 2015-03-09 DIAGNOSIS — G8194 Hemiplegia, unspecified affecting left nondominant side: Secondary | ICD-10-CM | POA: Diagnosis not present

## 2015-03-09 DIAGNOSIS — I69398 Other sequelae of cerebral infarction: Secondary | ICD-10-CM | POA: Diagnosis not present

## 2015-03-09 DIAGNOSIS — I6319 Cerebral infarction due to embolism of other precerebral artery: Secondary | ICD-10-CM | POA: Diagnosis not present

## 2015-05-30 ENCOUNTER — Encounter (INDEPENDENT_AMBULATORY_CARE_PROVIDER_SITE_OTHER): Payer: Medicare Other | Admitting: Ophthalmology

## 2015-05-30 DIAGNOSIS — H35033 Hypertensive retinopathy, bilateral: Secondary | ICD-10-CM

## 2015-05-30 DIAGNOSIS — H353122 Nonexudative age-related macular degeneration, left eye, intermediate dry stage: Secondary | ICD-10-CM | POA: Diagnosis not present

## 2015-05-30 DIAGNOSIS — E11311 Type 2 diabetes mellitus with unspecified diabetic retinopathy with macular edema: Secondary | ICD-10-CM | POA: Diagnosis not present

## 2015-05-30 DIAGNOSIS — I10 Essential (primary) hypertension: Secondary | ICD-10-CM | POA: Diagnosis not present

## 2015-05-30 DIAGNOSIS — H43813 Vitreous degeneration, bilateral: Secondary | ICD-10-CM | POA: Diagnosis not present

## 2015-05-30 DIAGNOSIS — E113392 Type 2 diabetes mellitus with moderate nonproliferative diabetic retinopathy without macular edema, left eye: Secondary | ICD-10-CM

## 2015-05-30 DIAGNOSIS — E113311 Type 2 diabetes mellitus with moderate nonproliferative diabetic retinopathy with macular edema, right eye: Secondary | ICD-10-CM

## 2015-05-30 DIAGNOSIS — H34831 Tributary (branch) retinal vein occlusion, right eye, with macular edema: Secondary | ICD-10-CM

## 2015-06-21 ENCOUNTER — Other Ambulatory Visit: Payer: Self-pay

## 2015-06-21 MED ORDER — ATORVASTATIN CALCIUM 20 MG PO TABS: 20.0000 mg | ORAL_TABLET | Freq: Every day | ORAL | Status: AC

## 2015-06-29 ENCOUNTER — Encounter (INDEPENDENT_AMBULATORY_CARE_PROVIDER_SITE_OTHER): Payer: Medicare Other | Admitting: Ophthalmology

## 2015-06-29 DIAGNOSIS — H34831 Tributary (branch) retinal vein occlusion, right eye, with macular edema: Secondary | ICD-10-CM | POA: Diagnosis not present

## 2015-06-29 DIAGNOSIS — E11311 Type 2 diabetes mellitus with unspecified diabetic retinopathy with macular edema: Secondary | ICD-10-CM

## 2015-06-29 DIAGNOSIS — E113311 Type 2 diabetes mellitus with moderate nonproliferative diabetic retinopathy with macular edema, right eye: Secondary | ICD-10-CM | POA: Diagnosis not present

## 2015-06-29 DIAGNOSIS — I10 Essential (primary) hypertension: Secondary | ICD-10-CM

## 2015-06-29 DIAGNOSIS — H35033 Hypertensive retinopathy, bilateral: Secondary | ICD-10-CM | POA: Diagnosis not present

## 2015-06-29 DIAGNOSIS — H43813 Vitreous degeneration, bilateral: Secondary | ICD-10-CM

## 2015-06-29 DIAGNOSIS — E113392 Type 2 diabetes mellitus with moderate nonproliferative diabetic retinopathy without macular edema, left eye: Secondary | ICD-10-CM

## 2015-06-29 DIAGNOSIS — H353122 Nonexudative age-related macular degeneration, left eye, intermediate dry stage: Secondary | ICD-10-CM | POA: Diagnosis not present

## 2015-08-10 ENCOUNTER — Encounter (INDEPENDENT_AMBULATORY_CARE_PROVIDER_SITE_OTHER): Payer: Medicare Other | Admitting: Ophthalmology

## 2015-08-10 DIAGNOSIS — H353122 Nonexudative age-related macular degeneration, left eye, intermediate dry stage: Secondary | ICD-10-CM

## 2015-08-10 DIAGNOSIS — I10 Essential (primary) hypertension: Secondary | ICD-10-CM | POA: Diagnosis not present

## 2015-08-10 DIAGNOSIS — H35033 Hypertensive retinopathy, bilateral: Secondary | ICD-10-CM

## 2015-08-10 DIAGNOSIS — E113311 Type 2 diabetes mellitus with moderate nonproliferative diabetic retinopathy with macular edema, right eye: Secondary | ICD-10-CM

## 2015-08-10 DIAGNOSIS — H43813 Vitreous degeneration, bilateral: Secondary | ICD-10-CM

## 2015-08-10 DIAGNOSIS — H34831 Tributary (branch) retinal vein occlusion, right eye, with macular edema: Secondary | ICD-10-CM

## 2015-08-10 DIAGNOSIS — E11311 Type 2 diabetes mellitus with unspecified diabetic retinopathy with macular edema: Secondary | ICD-10-CM

## 2015-08-10 DIAGNOSIS — E113392 Type 2 diabetes mellitus with moderate nonproliferative diabetic retinopathy without macular edema, left eye: Secondary | ICD-10-CM

## 2015-09-28 ENCOUNTER — Encounter (INDEPENDENT_AMBULATORY_CARE_PROVIDER_SITE_OTHER): Payer: Medicare Other | Admitting: Ophthalmology

## 2015-09-28 DIAGNOSIS — H43813 Vitreous degeneration, bilateral: Secondary | ICD-10-CM | POA: Diagnosis not present

## 2015-09-28 DIAGNOSIS — E113311 Type 2 diabetes mellitus with moderate nonproliferative diabetic retinopathy with macular edema, right eye: Secondary | ICD-10-CM

## 2015-09-28 DIAGNOSIS — E11311 Type 2 diabetes mellitus with unspecified diabetic retinopathy with macular edema: Secondary | ICD-10-CM

## 2015-09-28 DIAGNOSIS — I10 Essential (primary) hypertension: Secondary | ICD-10-CM

## 2015-09-28 DIAGNOSIS — H353122 Nonexudative age-related macular degeneration, left eye, intermediate dry stage: Secondary | ICD-10-CM

## 2015-09-28 DIAGNOSIS — H35033 Hypertensive retinopathy, bilateral: Secondary | ICD-10-CM

## 2015-09-28 DIAGNOSIS — E113212 Type 2 diabetes mellitus with mild nonproliferative diabetic retinopathy with macular edema, left eye: Secondary | ICD-10-CM | POA: Diagnosis not present

## 2015-09-28 DIAGNOSIS — H34831 Tributary (branch) retinal vein occlusion, right eye, with macular edema: Secondary | ICD-10-CM

## 2015-11-30 ENCOUNTER — Encounter (INDEPENDENT_AMBULATORY_CARE_PROVIDER_SITE_OTHER): Payer: Medicare Other | Admitting: Ophthalmology

## 2016-01-04 ENCOUNTER — Encounter (INDEPENDENT_AMBULATORY_CARE_PROVIDER_SITE_OTHER): Payer: Medicare Other | Admitting: Ophthalmology

## 2016-01-06 ENCOUNTER — Encounter (INDEPENDENT_AMBULATORY_CARE_PROVIDER_SITE_OTHER): Payer: Medicare Other | Admitting: Ophthalmology

## 2016-01-06 DIAGNOSIS — H34831 Tributary (branch) retinal vein occlusion, right eye, with macular edema: Secondary | ICD-10-CM | POA: Diagnosis not present

## 2016-01-06 DIAGNOSIS — H353122 Nonexudative age-related macular degeneration, left eye, intermediate dry stage: Secondary | ICD-10-CM | POA: Diagnosis not present

## 2016-01-06 DIAGNOSIS — E11311 Type 2 diabetes mellitus with unspecified diabetic retinopathy with macular edema: Secondary | ICD-10-CM | POA: Diagnosis not present

## 2016-01-06 DIAGNOSIS — I10 Essential (primary) hypertension: Secondary | ICD-10-CM

## 2016-01-06 DIAGNOSIS — H43813 Vitreous degeneration, bilateral: Secondary | ICD-10-CM | POA: Diagnosis not present

## 2016-01-06 DIAGNOSIS — E113311 Type 2 diabetes mellitus with moderate nonproliferative diabetic retinopathy with macular edema, right eye: Secondary | ICD-10-CM | POA: Diagnosis not present

## 2016-01-06 DIAGNOSIS — H35033 Hypertensive retinopathy, bilateral: Secondary | ICD-10-CM | POA: Diagnosis not present

## 2016-01-06 DIAGNOSIS — E113292 Type 2 diabetes mellitus with mild nonproliferative diabetic retinopathy without macular edema, left eye: Secondary | ICD-10-CM | POA: Diagnosis not present

## 2016-02-27 ENCOUNTER — Encounter (INDEPENDENT_AMBULATORY_CARE_PROVIDER_SITE_OTHER): Payer: Medicare Other | Admitting: Ophthalmology

## 2016-02-27 DIAGNOSIS — I10 Essential (primary) hypertension: Secondary | ICD-10-CM | POA: Diagnosis not present

## 2016-02-27 DIAGNOSIS — H34831 Tributary (branch) retinal vein occlusion, right eye, with macular edema: Secondary | ICD-10-CM

## 2016-02-27 DIAGNOSIS — E11311 Type 2 diabetes mellitus with unspecified diabetic retinopathy with macular edema: Secondary | ICD-10-CM

## 2016-02-27 DIAGNOSIS — H43813 Vitreous degeneration, bilateral: Secondary | ICD-10-CM | POA: Diagnosis not present

## 2016-02-27 DIAGNOSIS — E113292 Type 2 diabetes mellitus with mild nonproliferative diabetic retinopathy without macular edema, left eye: Secondary | ICD-10-CM | POA: Diagnosis not present

## 2016-02-27 DIAGNOSIS — E113311 Type 2 diabetes mellitus with moderate nonproliferative diabetic retinopathy with macular edema, right eye: Secondary | ICD-10-CM | POA: Diagnosis not present

## 2016-02-27 DIAGNOSIS — H353132 Nonexudative age-related macular degeneration, bilateral, intermediate dry stage: Secondary | ICD-10-CM

## 2016-02-27 DIAGNOSIS — H35033 Hypertensive retinopathy, bilateral: Secondary | ICD-10-CM

## 2016-04-16 ENCOUNTER — Encounter (INDEPENDENT_AMBULATORY_CARE_PROVIDER_SITE_OTHER): Payer: Medicare Other | Admitting: Ophthalmology

## 2016-04-16 DIAGNOSIS — E113311 Type 2 diabetes mellitus with moderate nonproliferative diabetic retinopathy with macular edema, right eye: Secondary | ICD-10-CM

## 2016-04-16 DIAGNOSIS — H43813 Vitreous degeneration, bilateral: Secondary | ICD-10-CM

## 2016-04-16 DIAGNOSIS — H353132 Nonexudative age-related macular degeneration, bilateral, intermediate dry stage: Secondary | ICD-10-CM | POA: Diagnosis not present

## 2016-04-16 DIAGNOSIS — I10 Essential (primary) hypertension: Secondary | ICD-10-CM

## 2016-04-16 DIAGNOSIS — E11311 Type 2 diabetes mellitus with unspecified diabetic retinopathy with macular edema: Secondary | ICD-10-CM

## 2016-04-16 DIAGNOSIS — E113392 Type 2 diabetes mellitus with moderate nonproliferative diabetic retinopathy without macular edema, left eye: Secondary | ICD-10-CM

## 2016-04-16 DIAGNOSIS — H35033 Hypertensive retinopathy, bilateral: Secondary | ICD-10-CM

## 2016-04-16 DIAGNOSIS — H34833 Tributary (branch) retinal vein occlusion, bilateral, with macular edema: Secondary | ICD-10-CM | POA: Diagnosis not present

## 2016-04-26 ENCOUNTER — Other Ambulatory Visit: Payer: Self-pay

## 2016-06-18 ENCOUNTER — Encounter (INDEPENDENT_AMBULATORY_CARE_PROVIDER_SITE_OTHER): Payer: Medicare Other | Admitting: Ophthalmology

## 2016-06-18 DIAGNOSIS — H43813 Vitreous degeneration, bilateral: Secondary | ICD-10-CM

## 2016-06-18 DIAGNOSIS — H35033 Hypertensive retinopathy, bilateral: Secondary | ICD-10-CM | POA: Diagnosis not present

## 2016-06-18 DIAGNOSIS — H353132 Nonexudative age-related macular degeneration, bilateral, intermediate dry stage: Secondary | ICD-10-CM

## 2016-06-18 DIAGNOSIS — E113311 Type 2 diabetes mellitus with moderate nonproliferative diabetic retinopathy with macular edema, right eye: Secondary | ICD-10-CM

## 2016-06-18 DIAGNOSIS — H34831 Tributary (branch) retinal vein occlusion, right eye, with macular edema: Secondary | ICD-10-CM | POA: Diagnosis not present

## 2016-06-18 DIAGNOSIS — E11311 Type 2 diabetes mellitus with unspecified diabetic retinopathy with macular edema: Secondary | ICD-10-CM

## 2016-06-18 DIAGNOSIS — E113292 Type 2 diabetes mellitus with mild nonproliferative diabetic retinopathy without macular edema, left eye: Secondary | ICD-10-CM

## 2016-06-18 DIAGNOSIS — I10 Essential (primary) hypertension: Secondary | ICD-10-CM

## 2016-08-20 ENCOUNTER — Encounter (INDEPENDENT_AMBULATORY_CARE_PROVIDER_SITE_OTHER): Payer: Medicare Other | Admitting: Ophthalmology

## 2016-08-20 DIAGNOSIS — H43813 Vitreous degeneration, bilateral: Secondary | ICD-10-CM

## 2016-08-20 DIAGNOSIS — E11311 Type 2 diabetes mellitus with unspecified diabetic retinopathy with macular edema: Secondary | ICD-10-CM | POA: Diagnosis not present

## 2016-08-20 DIAGNOSIS — E113292 Type 2 diabetes mellitus with mild nonproliferative diabetic retinopathy without macular edema, left eye: Secondary | ICD-10-CM | POA: Diagnosis not present

## 2016-08-20 DIAGNOSIS — E113311 Type 2 diabetes mellitus with moderate nonproliferative diabetic retinopathy with macular edema, right eye: Secondary | ICD-10-CM

## 2016-08-20 DIAGNOSIS — I10 Essential (primary) hypertension: Secondary | ICD-10-CM

## 2016-08-20 DIAGNOSIS — H34831 Tributary (branch) retinal vein occlusion, right eye, with macular edema: Secondary | ICD-10-CM

## 2016-08-20 DIAGNOSIS — H35033 Hypertensive retinopathy, bilateral: Secondary | ICD-10-CM

## 2016-08-20 DIAGNOSIS — H353122 Nonexudative age-related macular degeneration, left eye, intermediate dry stage: Secondary | ICD-10-CM | POA: Diagnosis not present

## 2016-10-29 ENCOUNTER — Encounter (INDEPENDENT_AMBULATORY_CARE_PROVIDER_SITE_OTHER): Payer: Medicare Other | Admitting: Ophthalmology

## 2016-10-29 DIAGNOSIS — H43813 Vitreous degeneration, bilateral: Secondary | ICD-10-CM

## 2016-10-29 DIAGNOSIS — I10 Essential (primary) hypertension: Secondary | ICD-10-CM

## 2016-10-29 DIAGNOSIS — H353132 Nonexudative age-related macular degeneration, bilateral, intermediate dry stage: Secondary | ICD-10-CM | POA: Diagnosis not present

## 2016-10-29 DIAGNOSIS — E113311 Type 2 diabetes mellitus with moderate nonproliferative diabetic retinopathy with macular edema, right eye: Secondary | ICD-10-CM | POA: Diagnosis not present

## 2016-10-29 DIAGNOSIS — E11311 Type 2 diabetes mellitus with unspecified diabetic retinopathy with macular edema: Secondary | ICD-10-CM | POA: Diagnosis not present

## 2016-10-29 DIAGNOSIS — E113292 Type 2 diabetes mellitus with mild nonproliferative diabetic retinopathy without macular edema, left eye: Secondary | ICD-10-CM | POA: Diagnosis not present

## 2016-10-29 DIAGNOSIS — H35033 Hypertensive retinopathy, bilateral: Secondary | ICD-10-CM

## 2016-10-29 DIAGNOSIS — H34831 Tributary (branch) retinal vein occlusion, right eye, with macular edema: Secondary | ICD-10-CM | POA: Diagnosis not present

## 2017-01-09 ENCOUNTER — Encounter (INDEPENDENT_AMBULATORY_CARE_PROVIDER_SITE_OTHER): Payer: Medicare Other | Admitting: Ophthalmology

## 2017-02-13 ENCOUNTER — Encounter (INDEPENDENT_AMBULATORY_CARE_PROVIDER_SITE_OTHER): Payer: Medicare Other | Admitting: Ophthalmology

## 2017-02-13 DIAGNOSIS — H34831 Tributary (branch) retinal vein occlusion, right eye, with macular edema: Secondary | ICD-10-CM

## 2017-02-13 DIAGNOSIS — H35033 Hypertensive retinopathy, bilateral: Secondary | ICD-10-CM | POA: Diagnosis not present

## 2017-02-13 DIAGNOSIS — H353132 Nonexudative age-related macular degeneration, bilateral, intermediate dry stage: Secondary | ICD-10-CM

## 2017-02-13 DIAGNOSIS — E11319 Type 2 diabetes mellitus with unspecified diabetic retinopathy without macular edema: Secondary | ICD-10-CM

## 2017-02-13 DIAGNOSIS — E113293 Type 2 diabetes mellitus with mild nonproliferative diabetic retinopathy without macular edema, bilateral: Secondary | ICD-10-CM | POA: Diagnosis not present

## 2017-02-13 DIAGNOSIS — H43813 Vitreous degeneration, bilateral: Secondary | ICD-10-CM

## 2017-02-13 DIAGNOSIS — I10 Essential (primary) hypertension: Secondary | ICD-10-CM

## 2017-03-28 IMAGING — DX DG HAND COMPLETE 3+V*L*
3 series · 3 of 3 positions shown · non-contrast
Comparison: None.

CLINICAL DATA: Patient with redness and swelling to the left index
and left little fingers.

EXAM:
LEFT HAND - COMPLETE 3+ VIEW

[x hand pa left]
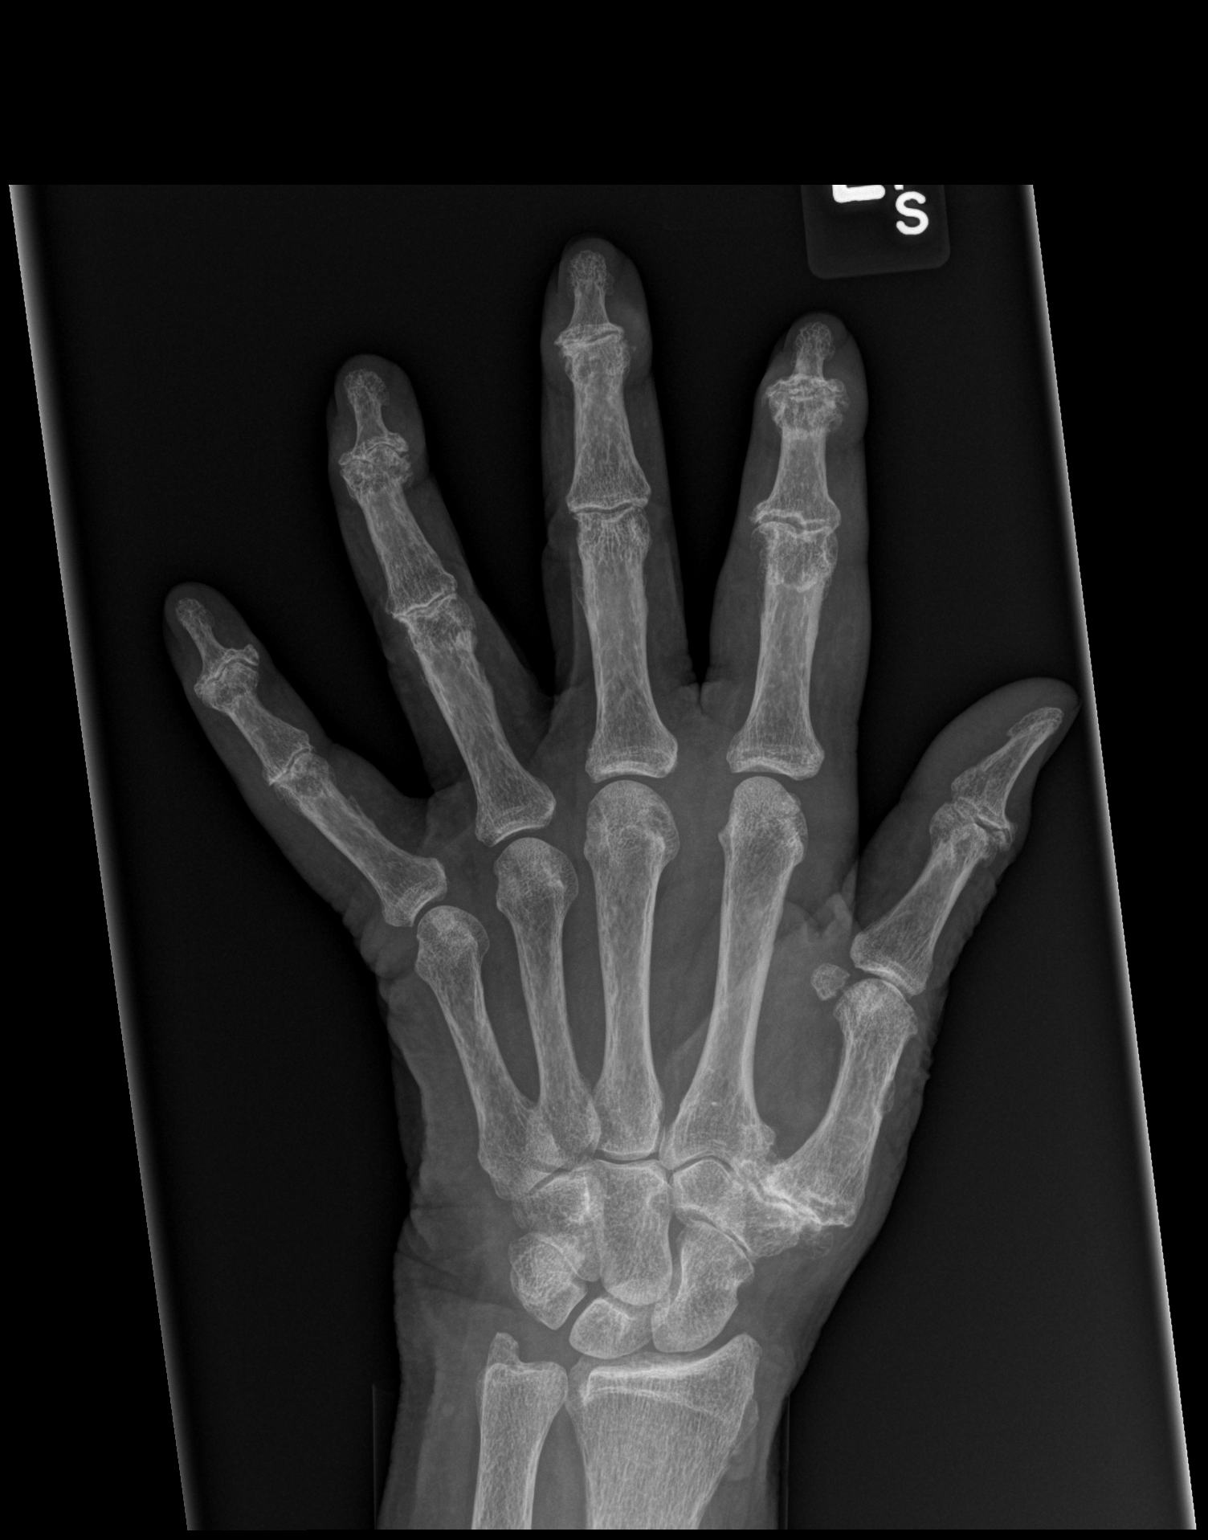

[x hand obl left]
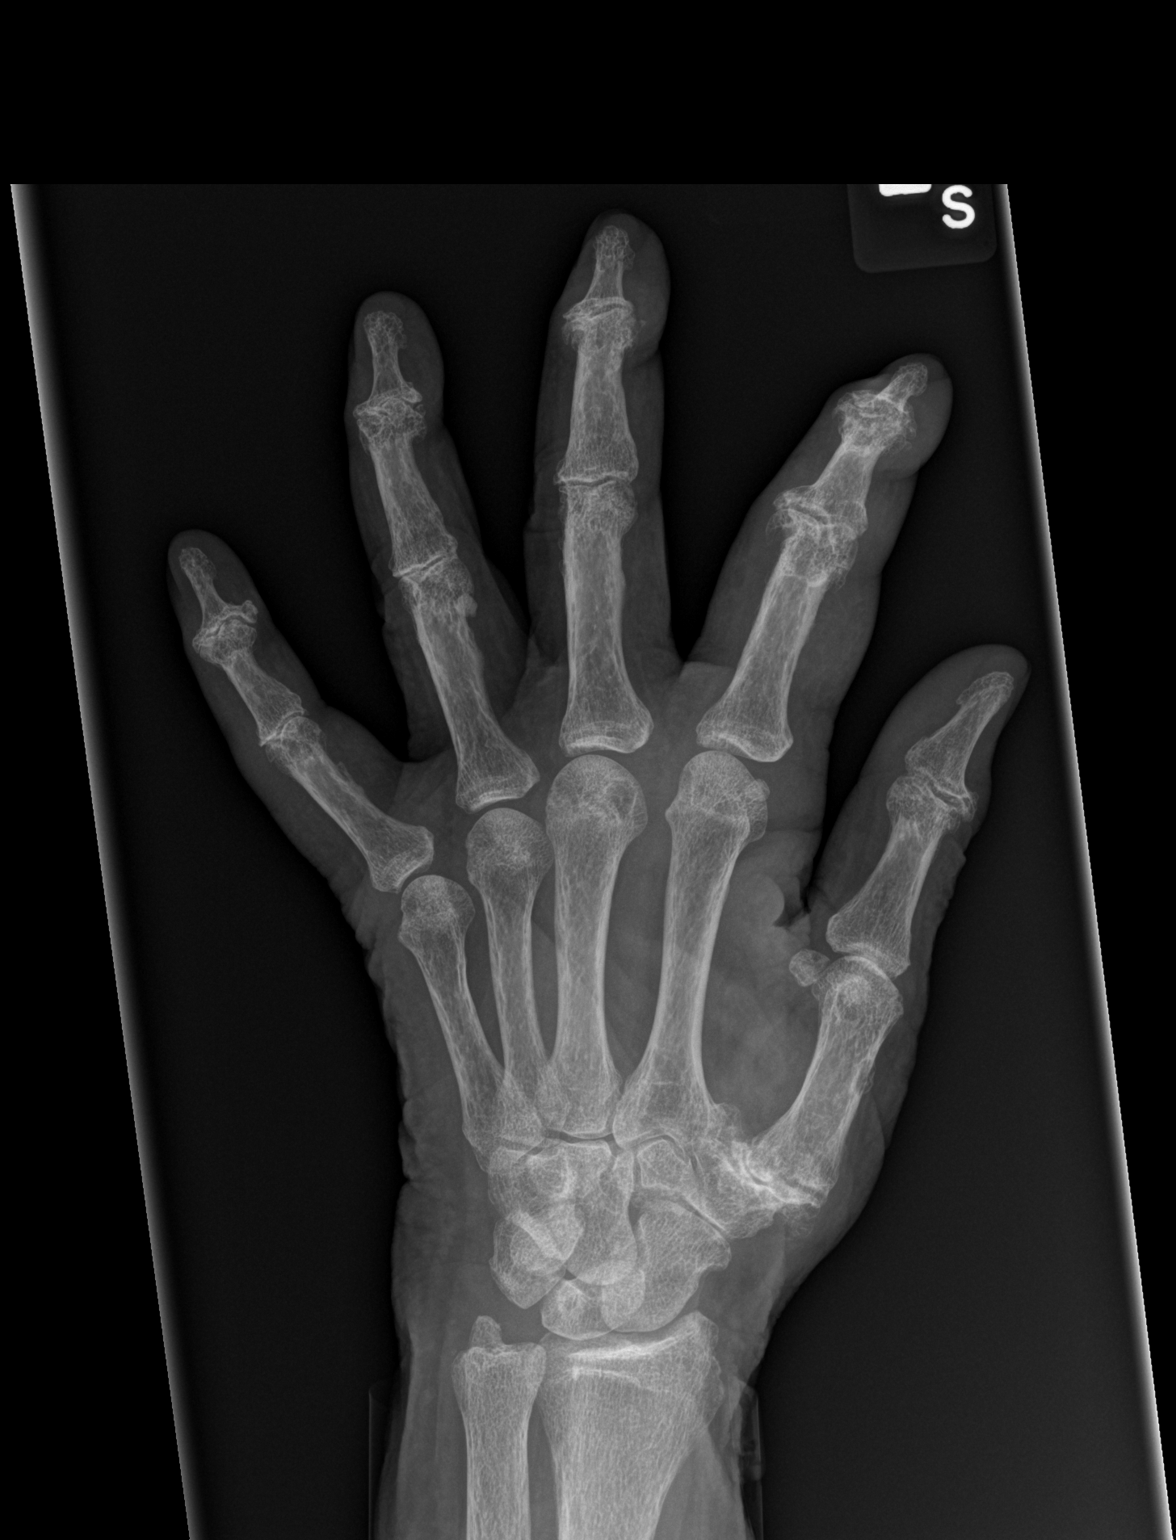

[x hand lat left]
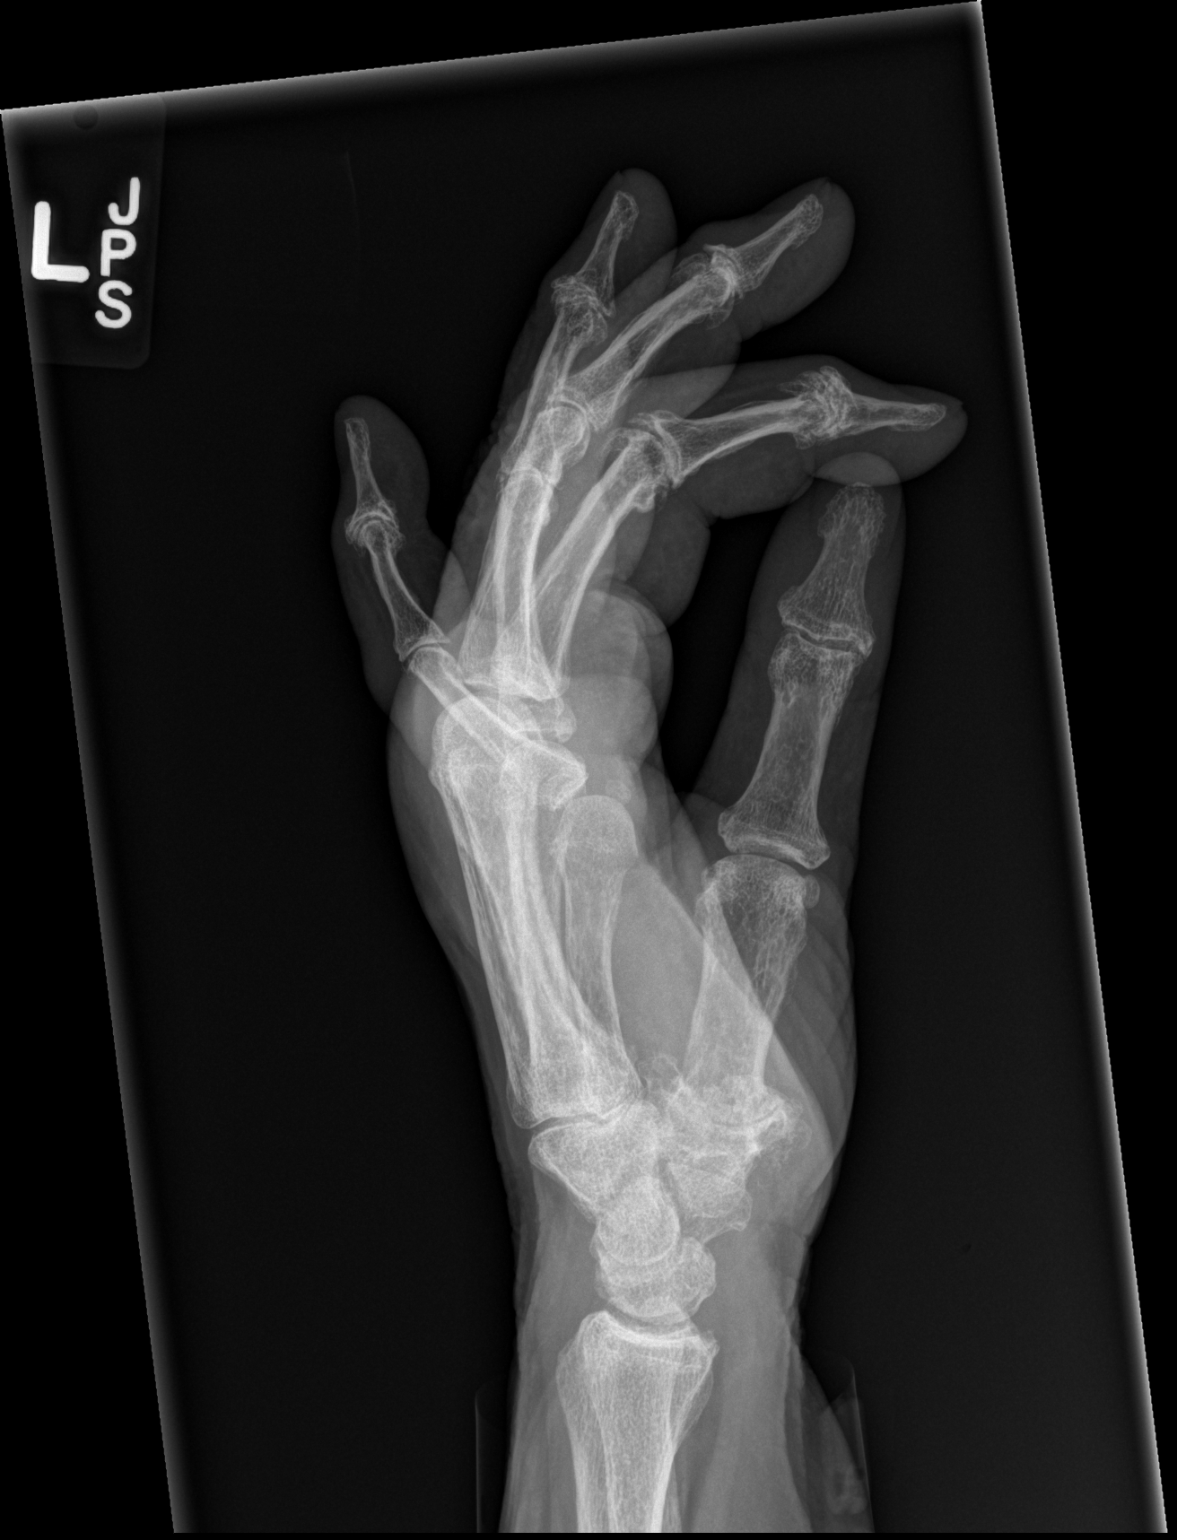

[3 of 3 positions shown; findings below may reference images not displayed]

FINDINGS: Degenerative changes of the DIP joints as well as the first CMC
joint. Osseous demineralization. No evidence for acute fracture.
Soft tissue swelling about the index finger. No underlying osseous
erosion.
IMPRESSION: Soft tissue swelling about the index finger concerning for acute
infectious process.

Degenerative changes as above.

No acute osseous abnormality.

## 2017-04-24 ENCOUNTER — Encounter (INDEPENDENT_AMBULATORY_CARE_PROVIDER_SITE_OTHER): Payer: Medicare Other | Admitting: Ophthalmology

## 2017-04-24 DIAGNOSIS — I10 Essential (primary) hypertension: Secondary | ICD-10-CM

## 2017-04-24 DIAGNOSIS — H35033 Hypertensive retinopathy, bilateral: Secondary | ICD-10-CM

## 2017-04-24 DIAGNOSIS — H43813 Vitreous degeneration, bilateral: Secondary | ICD-10-CM

## 2017-04-24 DIAGNOSIS — E113311 Type 2 diabetes mellitus with moderate nonproliferative diabetic retinopathy with macular edema, right eye: Secondary | ICD-10-CM

## 2017-04-24 DIAGNOSIS — H353221 Exudative age-related macular degeneration, left eye, with active choroidal neovascularization: Secondary | ICD-10-CM

## 2017-04-24 DIAGNOSIS — H353112 Nonexudative age-related macular degeneration, right eye, intermediate dry stage: Secondary | ICD-10-CM | POA: Diagnosis not present

## 2017-04-24 DIAGNOSIS — E113212 Type 2 diabetes mellitus with mild nonproliferative diabetic retinopathy with macular edema, left eye: Secondary | ICD-10-CM | POA: Diagnosis not present

## 2017-04-24 DIAGNOSIS — E11311 Type 2 diabetes mellitus with unspecified diabetic retinopathy with macular edema: Secondary | ICD-10-CM | POA: Diagnosis not present

## 2017-04-24 DIAGNOSIS — H34831 Tributary (branch) retinal vein occlusion, right eye, with macular edema: Secondary | ICD-10-CM | POA: Diagnosis not present

## 2017-06-19 ENCOUNTER — Encounter (INDEPENDENT_AMBULATORY_CARE_PROVIDER_SITE_OTHER): Payer: Medicare Other | Admitting: Ophthalmology

## 2017-06-19 DIAGNOSIS — H34831 Tributary (branch) retinal vein occlusion, right eye, with macular edema: Secondary | ICD-10-CM | POA: Diagnosis not present

## 2017-06-19 DIAGNOSIS — H43813 Vitreous degeneration, bilateral: Secondary | ICD-10-CM

## 2017-06-19 DIAGNOSIS — H353221 Exudative age-related macular degeneration, left eye, with active choroidal neovascularization: Secondary | ICD-10-CM | POA: Diagnosis not present

## 2017-06-19 DIAGNOSIS — I10 Essential (primary) hypertension: Secondary | ICD-10-CM | POA: Diagnosis not present

## 2017-06-19 DIAGNOSIS — E113291 Type 2 diabetes mellitus with mild nonproliferative diabetic retinopathy without macular edema, right eye: Secondary | ICD-10-CM | POA: Diagnosis not present

## 2017-06-19 DIAGNOSIS — E11319 Type 2 diabetes mellitus with unspecified diabetic retinopathy without macular edema: Secondary | ICD-10-CM | POA: Diagnosis not present

## 2017-06-19 DIAGNOSIS — E113392 Type 2 diabetes mellitus with moderate nonproliferative diabetic retinopathy without macular edema, left eye: Secondary | ICD-10-CM | POA: Diagnosis not present

## 2017-06-19 DIAGNOSIS — H35033 Hypertensive retinopathy, bilateral: Secondary | ICD-10-CM

## 2017-08-21 ENCOUNTER — Encounter (INDEPENDENT_AMBULATORY_CARE_PROVIDER_SITE_OTHER): Payer: Medicare Other | Admitting: Ophthalmology

## 2017-08-21 DIAGNOSIS — H34831 Tributary (branch) retinal vein occlusion, right eye, with macular edema: Secondary | ICD-10-CM

## 2017-08-21 DIAGNOSIS — I10 Essential (primary) hypertension: Secondary | ICD-10-CM

## 2017-08-21 DIAGNOSIS — H353221 Exudative age-related macular degeneration, left eye, with active choroidal neovascularization: Secondary | ICD-10-CM

## 2017-08-21 DIAGNOSIS — H35033 Hypertensive retinopathy, bilateral: Secondary | ICD-10-CM

## 2017-08-21 DIAGNOSIS — E113393 Type 2 diabetes mellitus with moderate nonproliferative diabetic retinopathy without macular edema, bilateral: Secondary | ICD-10-CM

## 2017-08-21 DIAGNOSIS — E11319 Type 2 diabetes mellitus with unspecified diabetic retinopathy without macular edema: Secondary | ICD-10-CM

## 2017-08-21 DIAGNOSIS — H353112 Nonexudative age-related macular degeneration, right eye, intermediate dry stage: Secondary | ICD-10-CM

## 2017-08-21 DIAGNOSIS — H43813 Vitreous degeneration, bilateral: Secondary | ICD-10-CM

## 2017-10-30 ENCOUNTER — Encounter (INDEPENDENT_AMBULATORY_CARE_PROVIDER_SITE_OTHER): Payer: Medicare Other | Admitting: Ophthalmology

## 2017-10-30 DIAGNOSIS — H35033 Hypertensive retinopathy, bilateral: Secondary | ICD-10-CM

## 2017-10-30 DIAGNOSIS — E113392 Type 2 diabetes mellitus with moderate nonproliferative diabetic retinopathy without macular edema, left eye: Secondary | ICD-10-CM

## 2017-10-30 DIAGNOSIS — I10 Essential (primary) hypertension: Secondary | ICD-10-CM

## 2017-10-30 DIAGNOSIS — H34831 Tributary (branch) retinal vein occlusion, right eye, with macular edema: Secondary | ICD-10-CM

## 2017-10-30 DIAGNOSIS — H353221 Exudative age-related macular degeneration, left eye, with active choroidal neovascularization: Secondary | ICD-10-CM

## 2017-10-30 DIAGNOSIS — E11319 Type 2 diabetes mellitus with unspecified diabetic retinopathy without macular edema: Secondary | ICD-10-CM

## 2017-10-30 DIAGNOSIS — H43813 Vitreous degeneration, bilateral: Secondary | ICD-10-CM

## 2017-10-30 DIAGNOSIS — H353112 Nonexudative age-related macular degeneration, right eye, intermediate dry stage: Secondary | ICD-10-CM

## 2018-02-05 ENCOUNTER — Encounter (INDEPENDENT_AMBULATORY_CARE_PROVIDER_SITE_OTHER): Payer: Medicare Other | Admitting: Ophthalmology

## 2018-02-05 DIAGNOSIS — H353221 Exudative age-related macular degeneration, left eye, with active choroidal neovascularization: Secondary | ICD-10-CM

## 2018-02-05 DIAGNOSIS — H34831 Tributary (branch) retinal vein occlusion, right eye, with macular edema: Secondary | ICD-10-CM

## 2018-02-05 DIAGNOSIS — I10 Essential (primary) hypertension: Secondary | ICD-10-CM | POA: Diagnosis not present

## 2018-02-05 DIAGNOSIS — H353112 Nonexudative age-related macular degeneration, right eye, intermediate dry stage: Secondary | ICD-10-CM

## 2018-02-05 DIAGNOSIS — E11311 Type 2 diabetes mellitus with unspecified diabetic retinopathy with macular edema: Secondary | ICD-10-CM

## 2018-02-05 DIAGNOSIS — H35033 Hypertensive retinopathy, bilateral: Secondary | ICD-10-CM | POA: Diagnosis not present

## 2018-02-05 DIAGNOSIS — H43813 Vitreous degeneration, bilateral: Secondary | ICD-10-CM

## 2018-02-05 DIAGNOSIS — E113312 Type 2 diabetes mellitus with moderate nonproliferative diabetic retinopathy with macular edema, left eye: Secondary | ICD-10-CM

## 2018-05-21 ENCOUNTER — Encounter (INDEPENDENT_AMBULATORY_CARE_PROVIDER_SITE_OTHER): Payer: Medicare Other | Admitting: Ophthalmology

## 2018-06-09 ENCOUNTER — Encounter (INDEPENDENT_AMBULATORY_CARE_PROVIDER_SITE_OTHER): Payer: Medicare Other | Admitting: Ophthalmology

## 2018-07-14 ENCOUNTER — Other Ambulatory Visit: Payer: Self-pay

## 2018-07-14 ENCOUNTER — Encounter (INDEPENDENT_AMBULATORY_CARE_PROVIDER_SITE_OTHER): Payer: Medicare Other | Admitting: Ophthalmology

## 2018-07-14 DIAGNOSIS — H43813 Vitreous degeneration, bilateral: Secondary | ICD-10-CM

## 2018-07-14 DIAGNOSIS — H34831 Tributary (branch) retinal vein occlusion, right eye, with macular edema: Secondary | ICD-10-CM | POA: Diagnosis not present

## 2018-07-14 DIAGNOSIS — H35033 Hypertensive retinopathy, bilateral: Secondary | ICD-10-CM | POA: Diagnosis not present

## 2018-07-14 DIAGNOSIS — H353221 Exudative age-related macular degeneration, left eye, with active choroidal neovascularization: Secondary | ICD-10-CM | POA: Diagnosis not present

## 2018-07-14 DIAGNOSIS — I10 Essential (primary) hypertension: Secondary | ICD-10-CM | POA: Diagnosis not present

## 2018-07-14 DIAGNOSIS — E113313 Type 2 diabetes mellitus with moderate nonproliferative diabetic retinopathy with macular edema, bilateral: Secondary | ICD-10-CM

## 2018-07-14 DIAGNOSIS — E11311 Type 2 diabetes mellitus with unspecified diabetic retinopathy with macular edema: Secondary | ICD-10-CM

## 2018-12-01 ENCOUNTER — Encounter (INDEPENDENT_AMBULATORY_CARE_PROVIDER_SITE_OTHER): Payer: Medicare Other | Admitting: Ophthalmology

## 2018-12-17 ENCOUNTER — Encounter (INDEPENDENT_AMBULATORY_CARE_PROVIDER_SITE_OTHER): Payer: Medicare Other | Admitting: Ophthalmology

## 2018-12-17 ENCOUNTER — Other Ambulatory Visit: Payer: Self-pay

## 2018-12-17 DIAGNOSIS — E113292 Type 2 diabetes mellitus with mild nonproliferative diabetic retinopathy without macular edema, left eye: Secondary | ICD-10-CM

## 2018-12-17 DIAGNOSIS — E11319 Type 2 diabetes mellitus with unspecified diabetic retinopathy without macular edema: Secondary | ICD-10-CM

## 2018-12-17 DIAGNOSIS — H35033 Hypertensive retinopathy, bilateral: Secondary | ICD-10-CM

## 2018-12-17 DIAGNOSIS — H348312 Tributary (branch) retinal vein occlusion, right eye, stable: Secondary | ICD-10-CM

## 2018-12-17 DIAGNOSIS — H353221 Exudative age-related macular degeneration, left eye, with active choroidal neovascularization: Secondary | ICD-10-CM | POA: Diagnosis not present

## 2018-12-17 DIAGNOSIS — I10 Essential (primary) hypertension: Secondary | ICD-10-CM

## 2018-12-17 DIAGNOSIS — H353112 Nonexudative age-related macular degeneration, right eye, intermediate dry stage: Secondary | ICD-10-CM

## 2019-05-20 ENCOUNTER — Encounter (INDEPENDENT_AMBULATORY_CARE_PROVIDER_SITE_OTHER): Payer: Medicare Other | Admitting: Ophthalmology

## 2020-01-30 DEATH — deceased
